# Patient Record
Sex: Female | Born: 1970 | Race: White | Hispanic: No | Marital: Married | Smoking: Never smoker
Health system: Southern US, Community
[De-identification: ages and names within clinical notes are randomized; demographics above are authoritative.]

## PROBLEM LIST (undated history)

## (undated) DIAGNOSIS — F419 Anxiety disorder, unspecified: Secondary | ICD-10-CM

## (undated) DIAGNOSIS — N979 Female infertility, unspecified: Secondary | ICD-10-CM

## (undated) DIAGNOSIS — Z9889 Other specified postprocedural states: Secondary | ICD-10-CM

## (undated) DIAGNOSIS — E669 Obesity, unspecified: Secondary | ICD-10-CM

## (undated) DIAGNOSIS — M549 Dorsalgia, unspecified: Secondary | ICD-10-CM

## (undated) DIAGNOSIS — F909 Attention-deficit hyperactivity disorder, unspecified type: Secondary | ICD-10-CM

## (undated) DIAGNOSIS — R002 Palpitations: Secondary | ICD-10-CM

## (undated) DIAGNOSIS — F329 Major depressive disorder, single episode, unspecified: Secondary | ICD-10-CM

## (undated) DIAGNOSIS — I471 Supraventricular tachycardia: Secondary | ICD-10-CM

## (undated) DIAGNOSIS — I10 Essential (primary) hypertension: Secondary | ICD-10-CM

## (undated) DIAGNOSIS — R42 Dizziness and giddiness: Secondary | ICD-10-CM

## (undated) DIAGNOSIS — E785 Hyperlipidemia, unspecified: Secondary | ICD-10-CM

## (undated) DIAGNOSIS — R112 Nausea with vomiting, unspecified: Secondary | ICD-10-CM

## (undated) DIAGNOSIS — K219 Gastro-esophageal reflux disease without esophagitis: Secondary | ICD-10-CM

## (undated) DIAGNOSIS — I4719 Other supraventricular tachycardia: Secondary | ICD-10-CM

## (undated) DIAGNOSIS — F32A Depression, unspecified: Secondary | ICD-10-CM

## (undated) DIAGNOSIS — R7303 Prediabetes: Secondary | ICD-10-CM

## (undated) HISTORY — DX: Obesity, unspecified: E66.9

## (undated) HISTORY — DX: Other specified postprocedural states: Z98.890

## (undated) HISTORY — DX: Supraventricular tachycardia: I47.1

## (undated) HISTORY — DX: Anxiety disorder, unspecified: F41.9

## (undated) HISTORY — DX: Depression, unspecified: F32.A

## (undated) HISTORY — PX: OOPHORECTOMY: SHX86

## (undated) HISTORY — DX: Dizziness and giddiness: R42

## (undated) HISTORY — PX: BREAST ENHANCEMENT SURGERY: SHX7

## (undated) HISTORY — DX: Other supraventricular tachycardia: I47.19

## (undated) HISTORY — PX: TUBAL LIGATION: SHX77

## (undated) HISTORY — PX: VAGINAL HYSTERECTOMY: SUR661

## (undated) HISTORY — PX: AUGMENTATION MAMMAPLASTY: SUR837

## (undated) HISTORY — DX: Prediabetes: R73.03

## (undated) HISTORY — DX: Nausea with vomiting, unspecified: R11.2

## (undated) HISTORY — DX: Essential (primary) hypertension: I10

## (undated) HISTORY — DX: Palpitations: R00.2

## (undated) HISTORY — DX: Gastro-esophageal reflux disease without esophagitis: K21.9

## (undated) HISTORY — DX: Hyperlipidemia, unspecified: E78.5

## (undated) HISTORY — PX: LAPAROSCOPY: SHX197

## (undated) HISTORY — DX: Female infertility, unspecified: N97.9

## (undated) HISTORY — PX: TOTAL VAGINAL HYSTERECTOMY: SHX2548

## (undated) HISTORY — DX: Dorsalgia, unspecified: M54.9

## (undated) HISTORY — PX: OTHER SURGICAL HISTORY: SHX169

## (undated) HISTORY — DX: Attention-deficit hyperactivity disorder, unspecified type: F90.9

---
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
## 2007-04-19 ENCOUNTER — Ambulatory Visit: Payer: Self-pay | Admitting: Family Medicine

## 2007-04-19 DIAGNOSIS — F418 Other specified anxiety disorders: Secondary | ICD-10-CM

## 2007-06-20 ENCOUNTER — Ambulatory Visit: Payer: Self-pay | Admitting: Family Medicine

## 2007-06-20 DIAGNOSIS — G4700 Insomnia, unspecified: Secondary | ICD-10-CM

## 2007-09-05 ENCOUNTER — Encounter: Payer: Self-pay | Admitting: Family Medicine

## 2008-03-21 ENCOUNTER — Ambulatory Visit: Payer: Self-pay | Admitting: Family Medicine

## 2008-03-21 DIAGNOSIS — E663 Overweight: Secondary | ICD-10-CM

## 2008-03-25 ENCOUNTER — Encounter: Payer: Self-pay | Admitting: Family Medicine

## 2008-03-26 LAB — CONVERTED CEMR LAB
Albumin: 4.2 g/dL (ref 3.5–5.2)
BUN: 15 mg/dL (ref 6–23)
Calcium: 8.9 mg/dL (ref 8.4–10.5)
Chloride: 106 meq/L (ref 96–112)
Creatinine, Ser: 0.58 mg/dL (ref 0.40–1.20)
Glucose, Bld: 97 mg/dL (ref 70–99)
HDL: 66 mg/dL (ref >39)
Potassium: 4.7 meq/L (ref 3.5–5.3)
Total CHOL/HDL Ratio: 3
Triglycerides: 97 mg/dL (ref <150)

## 2008-07-26 ENCOUNTER — Telehealth: Payer: Self-pay | Admitting: Family Medicine

## 2012-03-17 ENCOUNTER — Encounter: Payer: Self-pay | Admitting: Sports Medicine

## 2012-03-17 ENCOUNTER — Ambulatory Visit (INDEPENDENT_AMBULATORY_CARE_PROVIDER_SITE_OTHER): Payer: Self-pay | Admitting: Sports Medicine

## 2012-03-17 ENCOUNTER — Ambulatory Visit (INDEPENDENT_AMBULATORY_CARE_PROVIDER_SITE_OTHER): Payer: BC Managed Care – PPO

## 2012-03-17 VITALS — BP 126/71 | HR 62 | Wt 177.0 lb

## 2012-03-17 DIAGNOSIS — IMO0002 Reserved for concepts with insufficient information to code with codable children: Secondary | ICD-10-CM

## 2012-03-17 DIAGNOSIS — M47816 Spondylosis without myelopathy or radiculopathy, lumbar region: Secondary | ICD-10-CM | POA: Insufficient documentation

## 2012-03-17 DIAGNOSIS — M5416 Radiculopathy, lumbar region: Secondary | ICD-10-CM

## 2012-03-17 MED ORDER — PREDNISONE 50 MG PO TABS: ORAL_TABLET | ORAL | Status: DC

## 2012-03-17 MED ORDER — CYCLOBENZAPRINE HCL 10 MG PO TABS: ORAL_TABLET | ORAL | Status: DC

## 2012-03-17 MED ORDER — MELOXICAM 15 MG PO TABS
ORAL_TABLET | ORAL | Status: DC
Start: 2012-03-17 — End: 2012-04-11

## 2012-03-17 NOTE — Progress Notes (Signed)
  Subjective:    CC: Left leg pain  HPI: This is a very pleasant 42 year old female who is not been here since 2010. She comes in with a several month history of pain has been present in multiple locations on her left leg but starting in the lumbar spine, worse with flexion and worse with sitting in the car for long periods of time. The pain radiates down the posterior lateral aspect of her buttock, left thigh, over the anterior and medial aspect of her left knee, and down to her foot causing numbness and tingling in the last 2 toes. She has tried using a foam roller on the pain in her thigh, buttock, and knee, but notes the pain feels like a charley horse in that she cannot reproduce it with palpation. She does have a half-marathon coming up and would like to be ready for this. She denies any bowel or bladder dysfunction, or constitutional symptoms.  Past medical history, Surgical history, Family history not pertinant except as noted below, Social history, Allergies, and medications have been entered into the medical record, reviewed, and no changes needed.   Review of Systems: No headache, visual changes, nausea, vomiting, diarrhea, constipation, dizziness, abdominal pain, skin rash, fevers, chills, night sweats, weight loss, swollen lymph nodes, body aches, joint swelling, muscle aches, chest pain, shortness of breath, mood changes, visual or auditory hallucinations.   Objective:   General: Well Developed, well nourished, and in no acute distress.  Neuro/Psych: Alert and oriented x3, extra-ocular muscles intact, able to move all 4 extremities, sensation grossly intact. Skin: Warm and dry, no rashes noted.  Respiratory: Not using accessory muscles, speaking in full sentences, trachea midline.  Cardiovascular: Pulses palpable, no extremity edema. Abdomen: Does not appear distended. Back Exam:  Inspection: Unremarkable  Motion: Flexion 45 deg, Extension 45 deg, Side Bending to 45 deg bilaterally,   Rotation to 45 deg bilaterally  SLR laying: Positive with reproduction of radicular symptoms down the leg.  XSLR laying: Negative  Palpable tenderness: None. FABER: negative. Sensory change: Gross sensation intact to all lumbar and sacral dermatomes.  Reflexes: 2+ at both patellar tendons, 2+ at achilles tendons, Babinski's downgoing.  Strength at foot  Plantar-flexion: 5/5 Dorsi-flexion: 5/5 Eversion: 5/5 Inversion: 5/5  Leg strength  Quad: 5/5 Hamstring: 5/5 Hip flexor: 5/5 Hip abductors: 5/5  Gait unremarkable. Left Knee: Normal to inspection with no erythema or effusion or obvious bony abnormalities. Palpation normal with no warmth, joint line tenderness, patellar tenderness, or condyle tenderness. ROM full in flexion and extension and lower leg rotation. Ligaments with solid consistent endpoints including ACL, PCL, LCL, MCL. Negative Mcmurray's, Apley's, and Thessalonian tests. Non painful patellar compression. Patellar glide without crepitus. Patellar and quadriceps tendons unremarkable. Hamstring and quadriceps strength is normal.  X-rays were reviewed by me and show well-maintained disc spaces as well as normal facet joints however there is anterior osteophytic spurring particularly over the bodies of the L4 and L5 vertebrae.  Impression and Recommendations:   This case required medical decision making of moderate complexity.

## 2012-03-17 NOTE — Assessment & Plan Note (Signed)
Symptoms seem to represent a left-sided L5 versus S1 radiculitis. Prednisone, Mobic, Flexeril at bedtime, x-rays, formal physical therapy. Return to see me in 4 weeks, MRI for interventional injection planning if no better.

## 2012-03-20 ENCOUNTER — Ambulatory Visit (INDEPENDENT_AMBULATORY_CARE_PROVIDER_SITE_OTHER): Payer: BC Managed Care – PPO | Admitting: Physical Therapy

## 2012-03-20 DIAGNOSIS — M2560 Stiffness of unspecified joint, not elsewhere classified: Secondary | ICD-10-CM

## 2012-03-20 DIAGNOSIS — IMO0002 Reserved for concepts with insufficient information to code with codable children: Secondary | ICD-10-CM

## 2012-03-20 DIAGNOSIS — M629 Disorder of muscle, unspecified: Secondary | ICD-10-CM

## 2012-03-22 ENCOUNTER — Encounter (INDEPENDENT_AMBULATORY_CARE_PROVIDER_SITE_OTHER): Payer: BC Managed Care – PPO | Admitting: Physical Therapy

## 2012-03-22 DIAGNOSIS — M2560 Stiffness of unspecified joint, not elsewhere classified: Secondary | ICD-10-CM

## 2012-03-22 DIAGNOSIS — M545 Low back pain: Secondary | ICD-10-CM

## 2012-03-22 DIAGNOSIS — M6210 Other rupture of muscle (nontraumatic), unspecified site: Secondary | ICD-10-CM

## 2012-03-24 ENCOUNTER — Encounter (INDEPENDENT_AMBULATORY_CARE_PROVIDER_SITE_OTHER): Payer: BC Managed Care – PPO | Admitting: Physical Therapy

## 2012-03-24 DIAGNOSIS — M545 Low back pain: Secondary | ICD-10-CM

## 2012-03-24 DIAGNOSIS — M6281 Muscle weakness (generalized): Secondary | ICD-10-CM

## 2012-03-24 DIAGNOSIS — M2560 Stiffness of unspecified joint, not elsewhere classified: Secondary | ICD-10-CM

## 2012-03-27 ENCOUNTER — Encounter (INDEPENDENT_AMBULATORY_CARE_PROVIDER_SITE_OTHER): Payer: BC Managed Care – PPO | Admitting: Physical Therapy

## 2012-03-27 DIAGNOSIS — M2560 Stiffness of unspecified joint, not elsewhere classified: Secondary | ICD-10-CM

## 2012-03-27 DIAGNOSIS — M6281 Muscle weakness (generalized): Secondary | ICD-10-CM

## 2012-03-27 DIAGNOSIS — M545 Low back pain: Secondary | ICD-10-CM

## 2012-03-29 ENCOUNTER — Encounter (INDEPENDENT_AMBULATORY_CARE_PROVIDER_SITE_OTHER): Payer: BC Managed Care – PPO | Admitting: Physical Therapy

## 2012-03-29 ENCOUNTER — Telehealth: Payer: Self-pay | Admitting: *Deleted

## 2012-03-29 DIAGNOSIS — M545 Low back pain: Secondary | ICD-10-CM

## 2012-03-29 DIAGNOSIS — M5416 Radiculopathy, lumbar region: Secondary | ICD-10-CM

## 2012-03-29 DIAGNOSIS — M25649 Stiffness of unspecified hand, not elsewhere classified: Secondary | ICD-10-CM

## 2012-03-29 DIAGNOSIS — M6020 Foreign body granuloma of soft tissue, not elsewhere classified, unspecified site: Secondary | ICD-10-CM

## 2012-03-29 NOTE — Telephone Encounter (Signed)
LMOM to return call. Kimberly Gordon, LPN  

## 2012-03-29 NOTE — Telephone Encounter (Signed)
Patient calls and states she has done 5 sessions of physical therapy and her pain is not getting any better per pt pqain is worse. Wants to know if you want to do MRI or what other suggestions you have. Please call

## 2012-03-29 NOTE — Telephone Encounter (Signed)
Yes, now its time for MRI lumbar spine, he should come to see me to go over MRI results and come up with a plan. Will also route to [facility].

## 2012-03-30 ENCOUNTER — Telehealth: Payer: Self-pay | Admitting: *Deleted

## 2012-03-30 NOTE — Telephone Encounter (Signed)
Pt notified MRI order place. Barry Dienes, LPN

## 2012-03-30 NOTE — Telephone Encounter (Signed)
Called [employer] [place] and no pre cert is required for MRI Lumbar Spine without contrast. Bonita Quin notified with [facility] in [place]. Barry Dienes, LPN

## 2012-03-31 ENCOUNTER — Encounter: Payer: BC Managed Care – PPO | Admitting: Physical Therapy

## 2012-04-01 ENCOUNTER — Ambulatory Visit (HOSPITAL_BASED_OUTPATIENT_CLINIC_OR_DEPARTMENT_OTHER): Payer: BC Managed Care – PPO

## 2012-04-01 ENCOUNTER — Ambulatory Visit (HOSPITAL_BASED_OUTPATIENT_CLINIC_OR_DEPARTMENT_OTHER)
Admission: RE | Admit: 2012-04-01 | Discharge: 2012-04-01 | Disposition: A | Discharge status: Home / Self Care | Payer: BC Managed Care – PPO | Source: Ambulatory Visit | Attending: Sports Medicine | Admitting: Sports Medicine

## 2012-04-01 DIAGNOSIS — M79609 Pain in unspecified limb: Secondary | ICD-10-CM | POA: Insufficient documentation

## 2012-04-01 DIAGNOSIS — M5124 Other intervertebral disc displacement, thoracic region: Secondary | ICD-10-CM | POA: Insufficient documentation

## 2012-04-01 DIAGNOSIS — M545 Low back pain, unspecified: Secondary | ICD-10-CM | POA: Insufficient documentation

## 2012-04-01 DIAGNOSIS — M5126 Other intervertebral disc displacement, lumbar region: Secondary | ICD-10-CM | POA: Insufficient documentation

## 2012-04-03 ENCOUNTER — Encounter (INDEPENDENT_AMBULATORY_CARE_PROVIDER_SITE_OTHER): Payer: BC Managed Care – PPO | Admitting: Physical Therapy

## 2012-04-03 DIAGNOSIS — M545 Low back pain: Secondary | ICD-10-CM

## 2012-04-03 DIAGNOSIS — M6281 Muscle weakness (generalized): Secondary | ICD-10-CM

## 2012-04-03 DIAGNOSIS — M2560 Stiffness of unspecified joint, not elsewhere classified: Secondary | ICD-10-CM

## 2012-04-04 ENCOUNTER — Ambulatory Visit (INDEPENDENT_AMBULATORY_CARE_PROVIDER_SITE_OTHER): Payer: BC Managed Care – PPO | Admitting: Sports Medicine

## 2012-04-04 ENCOUNTER — Encounter: Payer: Self-pay | Admitting: Sports Medicine

## 2012-04-04 ENCOUNTER — Ambulatory Visit
Admission: RE | Admit: 2012-04-04 | Discharge: 2012-04-04 | Disposition: A | Discharge status: Home / Self Care | Payer: BC Managed Care – PPO | Source: Ambulatory Visit | Attending: Sports Medicine | Admitting: Sports Medicine

## 2012-04-04 VITALS — BP 123/69 | HR 82 | Wt 179.0 lb

## 2012-04-04 DIAGNOSIS — IMO0002 Reserved for concepts with insufficient information to code with codable children: Secondary | ICD-10-CM

## 2012-04-04 DIAGNOSIS — M5416 Radiculopathy, lumbar region: Secondary | ICD-10-CM

## 2012-04-04 MED ORDER — GABAPENTIN 300 MG PO CAPS: ORAL_CAPSULE | ORAL | Status: DC

## 2012-04-04 MED ORDER — METHYLPREDNISOLONE ACETATE 40 MG/ML INJ SUSP (RADIOLOG
120.0000 mg | Freq: Once | INTRAMUSCULAR | Status: AC
  Administered 2012-04-04: 120 mg via EPIDURAL

## 2012-04-04 MED ORDER — IOHEXOL 180 MG/ML  SOLN
1.0000 mL | Freq: Once | INTRAMUSCULAR | Status: AC | PRN
  Administered 2012-04-04: 1 mL via EPIDURAL

## 2012-04-04 NOTE — Progress Notes (Signed)
  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 To recap, it has been worse with flexion of the spine, as well as sitting in a car for long periods of time. She went through formal physical therapy as well as oral medications and is no better. I recently obtained an MRI for consideration of interventional injection planning.  Past medical history, Surgical history, Family history not pertinant except as noted below, Social history, Allergies, and medications have been entered into the medical record, reviewed, and no changes needed.   Review of Systems: No headache, visual changes, nausea, vomiting, diarrhea, constipation, dizziness, abdominal pain, skin rash, fevers, chills, night sweats, weight loss, swollen lymph nodes, body aches, joint swelling, muscle aches, chest pain, shortness of breath, mood changes, visual or auditory hallucinations.   Objective:   General: Well Developed, well nourished, and in no acute distress.  Neuro/Psych: Alert and oriented x3, extra-ocular muscles intact, able to move all 4 extremities, sensation grossly intact. Skin: Warm and dry, no rashes noted.  Respiratory: Not using accessory muscles, speaking in full sentences, trachea midline.  Cardiovascular: Pulses palpable, no extremity edema. Abdomen: Does not appear distended.  MRI results were reviewed she does have multilevel degenerative disc disease with a right-sided protrusion at the L3-L4 level, and based on my read a broad-based disc protrusion with disc desiccation at the L4-L5 level likely causing mild bilateral foraminal stenosis. I did discuss this with the radiologist and he feels that the changes at the L4-L5 level are minimal.  Impression and  Recommendations:   This case required medical decision making of moderate complexity.

## 2012-04-04 NOTE — Assessment & Plan Note (Signed)
On further review the MRI I do see bilateral mild foraminal stenosis at the L4-L5 level. I do think the left L4 nerve root is affected, I would like to try transforaminal epidural steroid injection on the left at the L4-L5 level. I will see her back to go over results of the injection. I'm also going to start her gabapentin up taper.

## 2012-04-05 ENCOUNTER — Encounter (INDEPENDENT_AMBULATORY_CARE_PROVIDER_SITE_OTHER): Payer: BC Managed Care – PPO | Admitting: Physical Therapy

## 2012-04-05 DIAGNOSIS — M545 Low back pain: Secondary | ICD-10-CM

## 2012-04-05 DIAGNOSIS — M6281 Muscle weakness (generalized): Secondary | ICD-10-CM

## 2012-04-05 DIAGNOSIS — M2560 Stiffness of unspecified joint, not elsewhere classified: Secondary | ICD-10-CM

## 2012-04-07 ENCOUNTER — Other Ambulatory Visit: Payer: BC Managed Care – PPO

## 2012-04-07 ENCOUNTER — Encounter (INDEPENDENT_AMBULATORY_CARE_PROVIDER_SITE_OTHER): Payer: BC Managed Care – PPO | Admitting: Physical Therapy

## 2012-04-07 DIAGNOSIS — M2560 Stiffness of unspecified joint, not elsewhere classified: Secondary | ICD-10-CM

## 2012-04-07 DIAGNOSIS — M6281 Muscle weakness (generalized): Secondary | ICD-10-CM

## 2012-04-07 DIAGNOSIS — M545 Low back pain: Secondary | ICD-10-CM

## 2012-04-10 ENCOUNTER — Encounter: Payer: Self-pay | Admitting: Sports Medicine

## 2012-04-10 ENCOUNTER — Encounter (INDEPENDENT_AMBULATORY_CARE_PROVIDER_SITE_OTHER): Payer: BC Managed Care – PPO | Admitting: Physical Therapy

## 2012-04-10 ENCOUNTER — Ambulatory Visit (INDEPENDENT_AMBULATORY_CARE_PROVIDER_SITE_OTHER): Payer: BC Managed Care – PPO | Admitting: Sports Medicine

## 2012-04-10 VITALS — BP 121/82 | HR 66 | Wt 178.0 lb

## 2012-04-10 DIAGNOSIS — M2560 Stiffness of unspecified joint, not elsewhere classified: Secondary | ICD-10-CM

## 2012-04-10 DIAGNOSIS — IMO0002 Reserved for concepts with insufficient information to code with codable children: Secondary | ICD-10-CM

## 2012-04-10 DIAGNOSIS — M6281 Muscle weakness (generalized): Secondary | ICD-10-CM

## 2012-04-10 DIAGNOSIS — M5416 Radiculopathy, lumbar region: Secondary | ICD-10-CM

## 2012-04-10 DIAGNOSIS — M545 Low back pain, unspecified: Secondary | ICD-10-CM

## 2012-04-10 MED ORDER — CYCLOBENZAPRINE HCL 10 MG PO TABS: 10.0000 mg | ORAL_TABLET | Freq: Three times a day (TID) | ORAL | Status: DC | PRN

## 2012-04-10 NOTE — Assessment & Plan Note (Signed)
Good response to initial left-sided L4-L5 interlaminar epidural steroid injection. I am sending her for repeat.

## 2012-04-10 NOTE — Progress Notes (Signed)
  Subjective:    CC: F/u epidural injection.  HPI:  This pleasant 42 year old female comes back after a left-sided L4-L5 interlaminar epidural steroid injection. She noted good benefit after the injection, she did run a half marathon afterwards and did very well. Some of her pain came back but overall she feels that her symptoms have improved significantly. She did with gabapentin and needs a refill on Mobic and cyclobenzaprine.  Past medical history, Surgical history, Family history not pertinant except as noted below, Social history, Allergies, and medications have been entered into the medical record, reviewed, and no changes needed.   Review of Systems: No headache, visual changes, nausea, vomiting, diarrhea, constipation, dizziness, abdominal pain, skin rash, fevers, chills, night sweats, weight loss, swollen lymph nodes, body aches, joint swelling, muscle aches, chest pain, shortness of breath, mood changes, visual or auditory hallucinations.   Objective:   General: Well Developed, well nourished, and in no acute distress.  Neuro/Psych: Alert and oriented x3, extra-ocular muscles intact, able to move all 4 extremities, sensation grossly intact. Skin: Warm and dry, no rashes noted.  Respiratory: Not using accessory muscles, speaking in full sentences, trachea midline.  Cardiovascular: Pulses palpable, no extremity edema. Abdomen: Does not appear distended. Impression and Recommendations:   This case required medical decision making of moderate complexity.

## 2012-04-11 ENCOUNTER — Other Ambulatory Visit: Payer: Self-pay | Admitting: *Deleted

## 2012-04-11 MED ORDER — MELOXICAM 15 MG PO TABS: ORAL_TABLET | ORAL | Status: DC

## 2012-04-12 ENCOUNTER — Encounter (INDEPENDENT_AMBULATORY_CARE_PROVIDER_SITE_OTHER): Payer: BC Managed Care – PPO | Admitting: Physical Therapy

## 2012-04-12 ENCOUNTER — Telehealth: Payer: Self-pay | Admitting: *Deleted

## 2012-04-12 DIAGNOSIS — M5416 Radiculopathy, lumbar region: Secondary | ICD-10-CM

## 2012-04-12 DIAGNOSIS — M545 Low back pain, unspecified: Secondary | ICD-10-CM

## 2012-04-12 DIAGNOSIS — M6281 Muscle weakness (generalized): Secondary | ICD-10-CM

## 2012-04-12 DIAGNOSIS — M2560 Stiffness of unspecified joint, not elsewhere classified: Secondary | ICD-10-CM

## 2012-04-12 NOTE — Telephone Encounter (Signed)
So sorry, orders placed!

## 2012-04-12 NOTE — Telephone Encounter (Signed)
Pt needs referral placed to [facility] for her injections. She would like to have it done on Monday b/c she is going out of town later next week.

## 2012-04-13 NOTE — Telephone Encounter (Signed)
LM for Danielle at [facility] to schedule.

## 2012-04-14 ENCOUNTER — Encounter (INDEPENDENT_AMBULATORY_CARE_PROVIDER_SITE_OTHER): Payer: BC Managed Care – PPO | Admitting: Physical Therapy

## 2012-04-14 DIAGNOSIS — M545 Low back pain: Secondary | ICD-10-CM

## 2012-04-14 DIAGNOSIS — M6281 Muscle weakness (generalized): Secondary | ICD-10-CM

## 2012-04-14 DIAGNOSIS — M2560 Stiffness of unspecified joint, not elsewhere classified: Secondary | ICD-10-CM

## 2012-04-17 ENCOUNTER — Ambulatory Visit
Admission: RE | Admit: 2012-04-17 | Discharge: 2012-04-17 | Disposition: A | Discharge status: Home / Self Care | Payer: BC Managed Care – PPO | Source: Ambulatory Visit | Attending: Sports Medicine | Admitting: Sports Medicine

## 2012-04-17 ENCOUNTER — Other Ambulatory Visit: Payer: BC Managed Care – PPO

## 2012-04-17 MED ORDER — METHYLPREDNISOLONE ACETATE 40 MG/ML INJ SUSP (RADIOLOG
120.0000 mg | Freq: Once | INTRAMUSCULAR | Status: AC
  Administered 2012-04-17: 120 mg via EPIDURAL

## 2012-04-17 MED ORDER — IOHEXOL 180 MG/ML  SOLN
1.0000 mL | Freq: Once | INTRAMUSCULAR | Status: AC | PRN
  Administered 2012-04-17: 1 mL via EPIDURAL

## 2012-04-21 ENCOUNTER — Encounter (INDEPENDENT_AMBULATORY_CARE_PROVIDER_SITE_OTHER): Payer: BC Managed Care – PPO | Admitting: Physical Therapy

## 2012-04-21 DIAGNOSIS — M6281 Muscle weakness (generalized): Secondary | ICD-10-CM

## 2012-04-21 DIAGNOSIS — M2560 Stiffness of unspecified joint, not elsewhere classified: Secondary | ICD-10-CM

## 2012-04-21 DIAGNOSIS — M545 Low back pain: Secondary | ICD-10-CM

## 2012-05-05 ENCOUNTER — Ambulatory Visit (INDEPENDENT_AMBULATORY_CARE_PROVIDER_SITE_OTHER): Payer: BC Managed Care – PPO | Admitting: Sports Medicine

## 2012-05-05 ENCOUNTER — Encounter: Payer: Self-pay | Admitting: Sports Medicine

## 2012-05-05 VITALS — BP 129/85 | HR 100 | Wt 174.0 lb

## 2012-05-05 DIAGNOSIS — M5416 Radiculopathy, lumbar region: Secondary | ICD-10-CM

## 2012-05-05 DIAGNOSIS — B029 Zoster without complications: Secondary | ICD-10-CM

## 2012-05-05 DIAGNOSIS — IMO0002 Reserved for concepts with insufficient information to code with codable children: Secondary | ICD-10-CM

## 2012-05-05 MED ORDER — CYCLOBENZAPRINE HCL 10 MG PO TABS: 10.0000 mg | ORAL_TABLET | Freq: Three times a day (TID) | ORAL | Status: DC | PRN

## 2012-05-05 MED ORDER — VALACYCLOVIR HCL 1 G PO TABS: 1000.0000 mg | ORAL_TABLET | Freq: Two times a day (BID) | ORAL | Status: DC

## 2012-05-05 MED ORDER — MELOXICAM 15 MG PO TABS: ORAL_TABLET | ORAL | Status: DC

## 2012-05-05 NOTE — Assessment & Plan Note (Signed)
Status post an L4-L5 left-sided interlaminar epidural. Now most recently status post left-sided L4-L5 transforaminal selective epidural steroid injection. Pain is decreased from a 7/10 to a 2/10. She's feeling good, able to run, and feels as though she does not need injection #3. She can come back to see me on an as-needed basis.

## 2012-05-05 NOTE — Patient Instructions (Addendum)

## 2012-05-05 NOTE — Assessment & Plan Note (Signed)
In the left upper cervical distribution, no evidence of trigeminal nerve involvement. Valtrex.

## 2012-05-05 NOTE — Progress Notes (Signed)
   Subjective:    CC: Followup  HPI: Left lumbar radiculitis: This very pleasant runner has now had 2 epidural injections, the first was interlaminar on the left side at the L4-L5 level, the second was transforaminal on the left side at the L4-L5 level. She reports that her pain is decreased from 7 to a 2/10, she's able to run but essentially no pain. She is very happy with the results, and does not feel she needs an additional injection just yet.  Rash: Present for almost a week on the left side of her neck, more painful and itchy, started out with a tingling sensation. No changes in detergent, jewelry.  Past medical history, Surgical history, Family history not pertinant except as noted below, Social history, Allergies, and medications have been entered into the medical record, reviewed, and no changes needed.   Review of Systems: No headache, visual changes, nausea, vomiting, diarrhea, constipation, dizziness, abdominal pain, skin rash, fevers, chills, night sweats, weight loss, swollen lymph nodes, body aches, joint swelling, muscle aches, chest pain, shortness of breath, mood changes, visual or auditory hallucinations.   Objective:   General: Well Developed, well nourished, and in no acute distress.  Neuro/Psych: Alert and oriented x3, extra-ocular muscles intact, able to move all 4 extremities, sensation grossly intact. Skin: Warm and dry, there is a grouped vesicular rash with crusts in various stages over the left side of her neck, left trapezius, as well as left upper chest. This rash does not cross the midline, there is no sign of bacterial superinfection. There is no involvement of her face. Respiratory: Not using accessory muscles, speaking in full sentences, trachea midline.  Cardiovascular: Pulses palpable, no extremity edema. Abdomen: Does not appear distended. Impression and Recommendations:   This case required medical decision making of moderate complexity.

## 2012-05-08 ENCOUNTER — Telehealth: Payer: Self-pay | Admitting: *Deleted

## 2012-05-08 NOTE — Telephone Encounter (Signed)
Once all lesions are crusted over, and she has been on the antiviral medicine for at least 24 hours she should be able to go back to work, she is not infectious to anyone that has had chickenpox in the past, or had the Varicella zoster or shingles vaccine.

## 2012-05-08 NOTE — Telephone Encounter (Signed)
Pt calls and states you diagnosed her Friday with shingles.  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 Also has jury duty on the 28th

## 2012-05-08 NOTE — Telephone Encounter (Signed)
Patient notified. Barry Dienes, LPN

## 2012-09-01 ENCOUNTER — Other Ambulatory Visit: Payer: Self-pay | Admitting: Sports Medicine

## 2012-11-08 ENCOUNTER — Ambulatory Visit (INDEPENDENT_AMBULATORY_CARE_PROVIDER_SITE_OTHER): Payer: BC Managed Care – PPO | Admitting: Physician Assistant

## 2012-11-08 DIAGNOSIS — Z23 Encounter for immunization: Secondary | ICD-10-CM

## 2012-11-08 NOTE — Progress Notes (Signed)
  Subjective:    Patient ID: Andrea Marquez, female    DOB: 1970-05-28, 42 y.o.   MRN: [id number]  HPI    Review of Systems     Objective:   Physical Exam        Assessment & Plan:  Flu shot given without complications. Tandy Gaw PA-C

## 2013-03-05 ENCOUNTER — Other Ambulatory Visit: Payer: Self-pay | Admitting: Sports Medicine

## 2013-09-10 ENCOUNTER — Other Ambulatory Visit: Payer: Self-pay | Admitting: Sports Medicine

## 2013-11-02 ENCOUNTER — Ambulatory Visit (INDEPENDENT_AMBULATORY_CARE_PROVIDER_SITE_OTHER): Payer: BC Managed Care – PPO | Admitting: Family Medicine

## 2013-11-02 VITALS — BP 114/78 | HR 60 | Ht 68.0 in | Wt 193.0 lb

## 2013-11-02 DIAGNOSIS — Z111 Encounter for screening for respiratory tuberculosis: Secondary | ICD-10-CM | POA: Diagnosis not present

## 2013-11-02 NOTE — Progress Notes (Signed)
   Subjective:    Patient ID: Andrea Marquez, female    DOB: December 21, 1970, 43 y.o.   MRN: [id number]  HPI Jamala reported today for PPD which was placed on her left lower arm. She was informed that she needed to RTC on Monday morning to have read. Margette Fast, CMA    Review of Systems     Objective:   Physical Exam        Assessment & Plan:

## 2013-11-05 LAB — TB SKIN TEST
Induration: 0 mm
TB Skin Test: NEGATIVE

## 2013-11-26 ENCOUNTER — Encounter: Payer: Self-pay | Admitting: Family Medicine

## 2013-11-26 ENCOUNTER — Ambulatory Visit (INDEPENDENT_AMBULATORY_CARE_PROVIDER_SITE_OTHER): Payer: BC Managed Care – PPO | Admitting: Family Medicine

## 2013-11-26 VITALS — BP 128/86 | HR 81 | Wt 187.0 lb

## 2013-11-26 DIAGNOSIS — B029 Zoster without complications: Secondary | ICD-10-CM | POA: Diagnosis not present

## 2013-11-26 DIAGNOSIS — N951 Menopausal and female climacteric states: Secondary | ICD-10-CM | POA: Diagnosis not present

## 2013-11-26 DIAGNOSIS — Z0000 Encounter for general adult medical examination without abnormal findings: Secondary | ICD-10-CM | POA: Diagnosis not present

## 2013-11-26 DIAGNOSIS — R635 Abnormal weight gain: Secondary | ICD-10-CM

## 2013-11-26 DIAGNOSIS — R232 Flushing: Secondary | ICD-10-CM

## 2013-11-26 LAB — CBC
HEMATOCRIT: 43 % (ref 36.0–46.0)
Hemoglobin: 14.9 g/dL (ref 12.0–15.0)
MCH: 32.1 pg (ref 26.0–34.0)
MCHC: 34.7 g/dL (ref 30.0–36.0)
MCV: 92.7 fL (ref 78.0–100.0)
Platelets: 284 10*3/uL (ref 150–400)
RBC: 4.64 MIL/uL (ref 3.87–5.11)
RDW: 13.1 % (ref 11.5–15.5)
WBC: 5.5 10*3/uL (ref 4.0–10.5)

## 2013-11-26 MED ORDER — VALACYCLOVIR HCL 1 G PO TABS: 1000.0000 mg | ORAL_TABLET | Freq: Two times a day (BID) | ORAL | Status: DC

## 2013-11-26 NOTE — Progress Notes (Signed)
Subjective:     Andrea Marquez is a 43 y.o. female and is here for a comprehensive physical exam. The patient reports no problems.she is very fresh rated with her recent weight gain. She said one point she gained 20 pounds in almost 2 months. She wonders if it could be her increased stress levels. She runs several miles per week and in fact participates in marathons. She feels like she knows the right foods to eat. She feels like for the most part she really eats what she should eat. He still complains of hot flashes during the daytime and at nighttime and still wonders if her thyroid could be off. It has been several years since we have done blood work on her. Unfortunately her husband has been communicating with other women. They are currently involved in marital counseling but it is not going well. He still continues to reach out to other women and does travel some for his job.    History   Social History  . Marital Status: Married    Spouse Name: N/A    Number of Children: N/A  . Years of Education: N/A   Occupational History  . Not on file.   Social History Main Topics  . Smoking status: Never Smoker   . Smokeless tobacco: Not on file  . Alcohol Use: Not on file  . Drug Use: No  . Sexual Activity: No   Other Topics Concern  . Not on file   Social History Narrative   Daily caffeine. Works on regularly.    Health Maintenance  Topic Date Due  . TETANUS/TDAP  06/06/1987  . INFLUENZA VACCINE  08/26/2014    The following portions of the patient's history were reviewed and updated as appropriate: allergies, current medications, past family history, past medical history, past social history, past surgical history and problem list.  Review of Systems A comprehensive review of systems was negative.   Objective:    BP 128/86 mmHg  Pulse 81  Wt 187 lb (84.823 kg) General appearance: alert, cooperative and appears stated age Head: Normocephalic, without obvious abnormality,  atraumatic Eyes: conj clear, EOMi, PEERLA Ears: normal TM's and external ear canals both ears Nose: Nares normal. Septum midline. Mucosa normal. No drainage or sinus tenderness. Throat: lips, mucosa, and tongue normal; teeth and gums normal Neck: no adenopathy, no carotid bruit, no JVD, supple, symmetrical, trachea midline and thyroid not enlarged, symmetric, no tenderness/mass/nodules Back: symmetric, no curvature. ROM normal. No CVA tenderness. Lungs: clear to auscultation bilaterally Breasts: normal appearance, no masses or tenderness Heart: regular rate and rhythm, S1, S2 normal, no murmur, click, rub or gallop Abdomen: soft, non-tender; bowel sounds normal; no masses,  no organomegaly Pelvic: deferred Extremities: extremities normal, atraumatic, no cyanosis or edema Pulses: 2+ and symmetric Skin: Skin color, texture, turgor normal. No rashes or lesions Lymph nodes: Cervical, supraclavicular, and axillary nodes normal. Neurologic: Alert and oriented X 3, normal strength and tone. Normal symmetric reflexes. Normal coordination and gait    Assessment:    Healthy female exam.      Plan:     See After Visit Summary for Counseling Recommendations   Keep up a regular exercise program and make sure you are eating a healthy diet Try to eat 4 servings of dairy a day, or if you are lactose intolerant take a calcium with vitamin D daily.  Your vaccines are up to date.   difficuly losing weight   -  Discussed possible nutrition referral.  Right now she is doing hCG drops and minimizing her calorie intake. She is exercising regularly.I really think a nutritionist consult would be very helpful for her. It sounds to me like overall she's doing a good job with diet and with regular exercise but something is missing. I think working with a specialist in this area could help treat her diet to make sure that she's getting enough calories and nutrition and protein in. We could also consider weight loss  medication in the future if she would like.  Acute situational stress-currently involved in marital counseling. Patient was very tearful in the office today. Encouraged her to call us if she feels like she needs any help or assistance.

## 2013-11-26 NOTE — Addendum Note (Signed)
Addended by: Teddy Spike on: 11/26/2013 01:37 PM   Modules accepted: Miquel Dunn

## 2013-11-26 NOTE — Addendum Note (Signed)
Addended by: Teddy Spike on: 11/26/2013 01:36 PM   Modules accepted: Miquel Dunn

## 2013-11-26 NOTE — Patient Instructions (Signed)
Keep up a regular exercise program and make sure you are eating a healthy diet Try to eat 4 servings of dairy a day, or if you are lactose intolerant take a calcium with vitamin D daily.  Your vaccines are up to date.   

## 2013-11-27 LAB — COMPREHENSIVE METABOLIC PANEL
ALBUMIN: 4.6 g/dL (ref 3.5–5.2)
ALK PHOS: 44 U/L (ref 39–117)
ALT: 25 U/L (ref 0–35)
AST: 20 U/L (ref 0–37)
BILIRUBIN TOTAL: 0.6 mg/dL (ref 0.2–1.2)
BUN: 13 mg/dL (ref 6–23)
CO2: 24 mEq/L (ref 19–32)
Calcium: 9.6 mg/dL (ref 8.4–10.5)
Chloride: 104 mEq/L (ref 96–112)
Creat: 0.75 mg/dL (ref 0.50–1.10)
Glucose, Bld: 101 mg/dL — ABNORMAL HIGH (ref 70–99)
POTASSIUM: 4.4 meq/L (ref 3.5–5.3)
SODIUM: 139 meq/L (ref 135–145)
TOTAL PROTEIN: 7.4 g/dL (ref 6.0–8.3)

## 2013-11-27 LAB — LIPID PANEL
CHOL/HDL RATIO: 3.4 ratio
Cholesterol: 185 mg/dL (ref 0–200)
HDL: 55 mg/dL (ref >39)
LDL CALC: 113 mg/dL — AB (ref 0–99)
Triglycerides: 86 mg/dL (ref <150)
VLDL: 17 mg/dL (ref 0–40)

## 2013-11-27 LAB — PROGESTERONE: Progesterone: 4.5 ng/mL

## 2013-11-27 LAB — ESTRADIOL: ESTRADIOL: 56.5 pg/mL

## 2013-11-27 LAB — TSH: TSH: 2.32 u[IU]/mL (ref 0.350–4.500)

## 2013-11-27 LAB — LUTEINIZING HORMONE: LH: 5.3 m[IU]/mL

## 2013-11-27 LAB — FOLLICLE STIMULATING HORMONE: FSH: 10 m[IU]/mL

## 2014-09-17 ENCOUNTER — Encounter: Payer: Self-pay | Admitting: Family Medicine

## 2014-09-17 ENCOUNTER — Ambulatory Visit (INDEPENDENT_AMBULATORY_CARE_PROVIDER_SITE_OTHER): Payer: Commercial Indemnity | Admitting: Family Medicine

## 2014-09-17 VITALS — BP 124/81 | HR 79 | Wt 186.0 lb

## 2014-09-17 DIAGNOSIS — Z0000 Encounter for general adult medical examination without abnormal findings: Secondary | ICD-10-CM

## 2014-09-17 DIAGNOSIS — Z114 Encounter for screening for human immunodeficiency virus [HIV]: Secondary | ICD-10-CM | POA: Diagnosis not present

## 2014-09-17 DIAGNOSIS — F430 Acute stress reaction: Secondary | ICD-10-CM

## 2014-09-17 MED ORDER — ESCITALOPRAM OXALATE 10 MG PO TABS: 10.0000 mg | ORAL_TABLET | Freq: Every day | ORAL | Status: DC

## 2014-09-17 NOTE — Progress Notes (Signed)
Subjective:     Andrea Marquez is a 44 y.o. female and is here for a comprehensive physical exam. The patient reports problems - she is still having problems with her marriage.  she has been have attempted to see 2 different marriage counselors but unfortunately has not worked out. She just is feeling very overwhelmed and tearful at times.  One of her kids is graduating form college this year and a 2nd in [place]. Says her children are supportive.  She also lost her job recently. Evidently her workplace lost a big contract and 15% of the workforce was cut and fortunately it affected her. She did receive a severance package so financially has been okay until June. She said she is sent out numbers 32 resumes to try to find a new job but has not heard back on any of them yet for an interview. She said she is crying easily and feels very emotional at times. She's not sure what to do at this point.  Social History   Social History  . Marital Status: Married    Spouse Name: Randall Hiss  . Number of Children: 2  . Years of Education: N/A   Occupational History  . Not on file.   Social History Main Topics  . Smoking status: Never Smoker   . Smokeless tobacco: Not on file  . Alcohol Use: Not on file  . Drug Use: No  . Sexual Activity: No   Other Topics Concern  . Not on file   Social History Narrative   Daily caffeine. Works on regularly.    Health Maintenance  Topic Date Due  . HIV Screening  06/06/1983  . INFLUENZA VACCINE  09/17/2015 (Originally 08/26/2014)  . TETANUS/TDAP  05/30/2023    The following portions of the patient's history were reviewed and updated as appropriate: allergies, current medications, past family history, past medical history, past social history, past surgical history and problem list.  Review of Systems A comprehensive review of systems was negative.   Objective:    BP 124/81 mmHg  Pulse 79  Wt 186 lb (84.369 kg) General appearance: alert, cooperative and appears  stated age Head: Normocephalic, without obvious abnormality, atraumatic Eyes: conj clear, EOMI, PEERLA Ears: normal TM's and external ear canals both ears Nose: Nares normal. Septum midline. Mucosa normal. No drainage or sinus tenderness. Throat: lips, mucosa, and tongue normal; teeth and gums normal Neck: no adenopathy, no carotid bruit, no JVD, supple, symmetrical, trachea midline and thyroid not enlarged, symmetric, no tenderness/mass/nodules Back: symmetric, no curvature. ROM normal. No CVA tenderness. Lungs: clear to auscultation bilaterally Breasts: normal appearance, no masses or tenderness Heart: regular rate and rhythm, S1, S2 normal, no murmur, click, rub or gallop Abdomen: soft, non-tender; bowel sounds normal; no masses,  no organomegaly Extremities: extremities normal, atraumatic, no cyanosis or edema Pulses: 2+ and symmetric Skin: Skin color, texture, turgor normal. No rashes or lesions Lymph nodes: Cervical, supraclavicular, and axillary nodes normal. Neurologic: Alert and oriented X 3, normal strength and tone. Normal symmetric reflexes. Normal coordination and gait a   Assessment:    Healthy female exam.      Plan:     See After Visit Summary for Counseling Recommendations   Keep up a regular exercise program and make sure you are eating a healthy diet Try to eat 4 servings of dairy a day, or if you are lactose intolerant take a calcium with vitamin D daily.  Your vaccines are up to date.  Discussed mammogram  guidelines.  She will hold off this year and may schedule later this year or next year.    Acute stress - discussed options.  If husband not wanted to pursue further counseling I think it would be good for her to help her make some life decisions and to help organize her thoughts.  Also discussed putting her on an SSRI to help with tearfulness and feeling anxious and down.  Will start lexapro. F/U in 3-4 weeks. Call if any problems or S.E.

## 2014-09-19 LAB — COMPLETE METABOLIC PANEL WITH GFR
ALK PHOS: 31 U/L — AB (ref 33–115)
ALT: 38 U/L — ABNORMAL HIGH (ref 6–29)
AST: 26 U/L (ref 10–35)
Albumin: 4.1 g/dL (ref 3.6–5.1)
BUN: 17 mg/dL (ref 7–25)
CO2: 25 mmol/L (ref 20–31)
Calcium: 9 mg/dL (ref 8.6–10.2)
Chloride: 106 mmol/L (ref 98–110)
Creat: 0.61 mg/dL (ref 0.50–1.10)
GFR, Est African American: >89 mL/min (ref >=60)
GFR, Est Non African American: >89 mL/min (ref >=60)
GLUCOSE: 97 mg/dL (ref 65–99)
Potassium: 4.6 mmol/L (ref 3.5–5.3)
SODIUM: 142 mmol/L (ref 135–146)
Total Bilirubin: 0.5 mg/dL (ref 0.2–1.2)
Total Protein: 6.3 g/dL (ref 6.1–8.1)

## 2014-09-19 LAB — LIPID PANEL
CHOL/HDL RATIO: 3 ratio (ref <=5.0)
Cholesterol: 168 mg/dL (ref 125–200)
HDL: 56 mg/dL (ref >=46)
LDL Cholesterol: 93 mg/dL (ref <130)
Triglycerides: 97 mg/dL (ref <150)
VLDL: 19 mg/dL (ref <30)

## 2014-09-19 LAB — TSH: TSH: 1.858 u[IU]/mL (ref 0.350–4.500)

## 2014-09-20 LAB — HIV ANTIBODY (ROUTINE TESTING W REFLEX): HIV 1&2 Ab, 4th Generation: NONREACTIVE

## 2014-10-15 ENCOUNTER — Ambulatory Visit: Payer: Commercial Indemnity | Admitting: Family Medicine

## 2014-10-21 ENCOUNTER — Ambulatory Visit (INDEPENDENT_AMBULATORY_CARE_PROVIDER_SITE_OTHER): Payer: Commercial Indemnity | Admitting: Family Medicine

## 2014-10-21 ENCOUNTER — Encounter: Payer: Self-pay | Admitting: Family Medicine

## 2014-10-21 VITALS — BP 122/85 | HR 64 | Ht 68.0 in | Wt 191.0 lb

## 2014-10-21 DIAGNOSIS — F430 Acute stress reaction: Secondary | ICD-10-CM | POA: Diagnosis not present

## 2014-10-21 MED ORDER — FLUOXETINE HCL 10 MG PO CAPS
ORAL_CAPSULE | ORAL | Status: DC
Start: 2014-10-21 — End: 2014-12-12

## 2014-10-21 NOTE — Progress Notes (Signed)
   Subjective:    Patient ID: Andrea Marquez, female    DOB: 01-10-1971, 44 y.o.   MRN: [id number]  HPI mood GAD-7= PHQ-9= doing better, crying episodes have gotten better also    Overall she's been tolerating the Lexapro well. She's noticed that she's much less tearful and now crying not even on a daily basis. She does feel like she's handled some stressors a little better. she says her husband has been responding to her in a better way. Her unemployement has run out.  Doesn't feel as motivated. She wants to exercise but doesn't feel motivated.  But when her kids came home over the weekend she felt extremely motivated to prepare food and says she's been him is the whole weekend in the kitchen trying to make their favorite things. They are at college. She just found out that her unemployment expires after 13 weeks and she cannot get an extension. She has been doing some classes in management to try to get a new job and make some more money if not better to what she was making before. She said initially her husband was not supportive of her at all and now he has been more supportive. She still not sleeping well and still feels like she constantly feels nervous internally all day long. She also feels down and feels little interest or pleasure in doing things nearly every day. She's not had any negative side effects with the Lexapro.  Review of Systems     Objective:   Physical Exam  Constitutional: She is oriented to person, place, and time. She appears well-developed and well-nourished.  HENT:  Head: Normocephalic and atraumatic.  Eyes: Conjunctivae and EOM are normal.  Cardiovascular: Normal rate.   Pulmonary/Chest: Effort normal.  Neurological: She is alert and oriented to person, place, and time.  Skin: Skin is dry. No pallor.  Psychiatric: She has a normal mood and affect. Her behavior is normal.          Assessment & Plan:  Acute situational depression with anxiety-gad 7 score of 19  today and PHQ 9 score of 25. I discussed with her that even though she is getting some benefit with the Lexapro I am not seeing a significant drop in her symptoms. She hasn't been able to go to therapy regularly because she is without a job right now. We discussed discuss switching her to fluoxetine. Will make that change and I'll see her back in about 5-6 weeks. We can adjust her dose at that time.  Time spent 20 min, > 50% spent counseling about depression and anxiety

## 2014-10-21 NOTE — Patient Instructions (Signed)
Cut the lexapro in half. Take half a tab daily for 5 days and then switch to Lexapro.

## 2014-12-02 ENCOUNTER — Encounter: Payer: Self-pay | Admitting: Family Medicine

## 2014-12-02 ENCOUNTER — Ambulatory Visit (INDEPENDENT_AMBULATORY_CARE_PROVIDER_SITE_OTHER): Payer: Commercial Indemnity | Admitting: Family Medicine

## 2014-12-02 VITALS — BP 141/88 | HR 68 | Ht 68.0 in | Wt 201.0 lb

## 2014-12-02 DIAGNOSIS — Z23 Encounter for immunization: Secondary | ICD-10-CM

## 2014-12-02 DIAGNOSIS — F418 Other specified anxiety disorders: Secondary | ICD-10-CM

## 2014-12-02 NOTE — Progress Notes (Signed)
   Subjective:    Patient ID: Andrea Marquez, female    DOB: 1970/07/31, 44 y.o.   MRN: [id number]  HPI Says the fluoxetine has helps eome but she has noticed she is not sleeping well and she is more sweaty and she is picking at her nails like a nervous habit when she has never done that before.  Says she slept more on the Lexapro.  Says triid splitting the fluxoetine to see if helped with her sleep but it didn't.   She does feel like there's been less conflict with her and her husband lately. They have actually been getting along a little bit better. And her children came to visit last weekend which was a very positive and good experience for her. She does feel like she still worries excessively to the point where it often does keep her awake at night. She has hard time letting things go. And always thinks of the worst possible situation especially for her children. She does report decreased pleasure in doing things more than half the days and still feeling down several days of the week.  She took zoloft years ago but says didn't really take it very long.   She doesn't remember any negative side effects but she is not sure.   Review of Systems     Objective:   Physical Exam  Constitutional: She is oriented to person, place, and time. She appears well-developed and well-nourished.  HENT:  Head: Normocephalic and atraumatic.  Cardiovascular: Normal rate, regular rhythm and normal heart sounds.   Pulmonary/Chest: Effort normal and breath sounds normal.  Neurological: She is alert and oriented to person, place, and time.  Skin: Skin is warm and dry.  Psychiatric: She has a normal mood and affect. Her behavior is normal.          Assessment & Plan:  Depression with anxiety -  She is now on 20 mg of fluoxetine. She is having some side effects are little bit concerning. She's having some difficulty with sleep and is starting to get some nervous habits that she's never had before. We discussed  several options. We could consider going up on the fluoxetine. It may take higher dose to be therapeutic for her and to really hit some of her anxiety component of her mood. Versus we could go back to Lexapro. The she was sleeping much better on the Lexapro. Or we can even consider putting her on Zoloft which may help hit some of the anxiety symptoms. She's not actively enrolled in therapy or counseling because she's not working right now and they don't really have the extra income. I still think this would be incredibly helpful for her. PHQ 9 score of 17 today, previous was 25.  Time spent 20 min, > 50% spent counseling about depression and anxiety

## 2014-12-02 NOTE — Patient Instructions (Signed)
Increase fluoxetine 40mg  daily.

## 2014-12-12 ENCOUNTER — Other Ambulatory Visit: Payer: Self-pay | Admitting: *Deleted

## 2014-12-12 MED ORDER — FLUOXETINE HCL 40 MG PO CAPS: 40.0000 mg | ORAL_CAPSULE | Freq: Every day | ORAL | Status: DC

## 2015-01-01 ENCOUNTER — Ambulatory Visit (INDEPENDENT_AMBULATORY_CARE_PROVIDER_SITE_OTHER): Payer: Commercial Indemnity | Admitting: Family Medicine

## 2015-01-01 ENCOUNTER — Encounter: Payer: Self-pay | Admitting: Family Medicine

## 2015-01-01 VITALS — BP 146/94 | HR 63 | Temp 98.2°F | Resp 18 | Wt 205.8 lb

## 2015-01-01 DIAGNOSIS — F418 Other specified anxiety disorders: Secondary | ICD-10-CM

## 2015-01-01 DIAGNOSIS — R030 Elevated blood-pressure reading, without diagnosis of hypertension: Secondary | ICD-10-CM | POA: Diagnosis not present

## 2015-01-01 DIAGNOSIS — R635 Abnormal weight gain: Secondary | ICD-10-CM | POA: Diagnosis not present

## 2015-01-01 DIAGNOSIS — IMO0001 Reserved for inherently not codable concepts without codable children: Secondary | ICD-10-CM

## 2015-01-01 MED ORDER — SERTRALINE HCL 50 MG PO TABS: ORAL_TABLET | ORAL | Status: DC

## 2015-01-01 MED ORDER — FLUOXETINE HCL 10 MG PO CAPS: ORAL_CAPSULE | ORAL | Status: DC

## 2015-01-01 NOTE — Progress Notes (Signed)
   Subjective:    Patient ID: Andrea Marquez, female    DOB: October 22, 1970, 44 y.o.   MRN: [id number]  HPI Depression/Anxiety - Says not sleeping well and says having nightmares on the medication.  Notices a lot of sweating as well. She is tearful again and fels like having more nervous habits. Feels more angry as well.    Abnormal weight gain - BP was high last OV and today. She has gained about 15 lbs.  No HAs or swelling. She has been trying to exercise regularly.   Review of Systems     Objective:   Physical Exam  Constitutional: She is oriented to person, place, and time. She appears well-developed and well-nourished.  HENT:  Head: Normocephalic and atraumatic.  Eyes: Conjunctivae and EOM are normal.  Cardiovascular: Normal rate.   Pulmonary/Chest: Effort normal.  Neurological: She is alert and oriented to person, place, and time.  Skin: Skin is dry. No pallor.  Psychiatric: She has a normal mood and affect. Her behavior is normal.  Vitals reviewed.         Assessment & Plan:  Depression/anxiety-she's not doing well with the fluoxetine. In fact we are going to wean this and discontinue this over the next 10 days. I met in it to her intolerance list because of nightmares, sweating and insomnia. It may even be somewhat responsible for the weight gain since she really feels like she's been working out and really watching her diet. We will try putting her on sertraline she has never taken that before. Hopefully she'll do well. Also her back in about 5 weeks and then we can adjust her dose if needed. The other consideration would be to go back to Lexapro which she only tried for a month. It was helping but didn't really quite get therapeutic goal for her.  Abnormal weight gain-we'll keep an eye on this. Certainly could be medication related. Did encourage her to continue with diet and exercise and calorie count.  Blood pressure was elevated today. Most likely secondary to recent  weight gain. We'll recheck this again at her follow-up in 5 weeks. If it's still elevated then we will need to consider starting a blood pressure medication.

## 2015-04-07 ENCOUNTER — Ambulatory Visit (INDEPENDENT_AMBULATORY_CARE_PROVIDER_SITE_OTHER): Payer: Managed Care, Other (non HMO) | Admitting: Family Medicine

## 2015-04-07 ENCOUNTER — Encounter: Payer: Self-pay | Admitting: Family Medicine

## 2015-04-07 VITALS — BP 142/92 | HR 69 | Wt 228.0 lb

## 2015-04-07 DIAGNOSIS — IMO0001 Reserved for inherently not codable concepts without codable children: Secondary | ICD-10-CM

## 2015-04-07 DIAGNOSIS — F418 Other specified anxiety disorders: Secondary | ICD-10-CM

## 2015-04-07 DIAGNOSIS — H60542 Acute eczematoid otitis externa, left ear: Secondary | ICD-10-CM

## 2015-04-07 DIAGNOSIS — R030 Elevated blood-pressure reading, without diagnosis of hypertension: Secondary | ICD-10-CM | POA: Diagnosis not present

## 2015-04-07 DIAGNOSIS — I10 Essential (primary) hypertension: Secondary | ICD-10-CM | POA: Insufficient documentation

## 2015-04-07 DIAGNOSIS — H60549 Acute eczematoid otitis externa, unspecified ear: Secondary | ICD-10-CM | POA: Insufficient documentation

## 2015-04-07 DIAGNOSIS — R635 Abnormal weight gain: Secondary | ICD-10-CM | POA: Diagnosis not present

## 2015-04-07 LAB — CBC
HEMATOCRIT: 42.4 % (ref 36.0–46.0)
HEMOGLOBIN: 14.6 g/dL (ref 12.0–15.0)
MCH: 32.8 pg (ref 26.0–34.0)
MCHC: 34.4 g/dL (ref 30.0–36.0)
MCV: 95.3 fL (ref 78.0–100.0)
MPV: 11.3 fL (ref 8.6–12.4)
Platelets: 258 10*3/uL (ref 150–400)
RBC: 4.45 MIL/uL (ref 3.87–5.11)
RDW: 13.3 % (ref 11.5–15.5)
WBC: 6.1 10*3/uL (ref 4.0–10.5)

## 2015-04-07 LAB — T4, FREE: FREE T4: 1 ng/dL (ref 0.8–1.8)

## 2015-04-07 LAB — TSH: TSH: 3.58 mIU/L

## 2015-04-07 MED ORDER — ACETIC ACID 2 % OT SOLN: 4.0000 [drp] | Freq: Three times a day (TID) | OTIC | Status: DC

## 2015-04-07 MED ORDER — BUPROPION HCL ER (XL) 150 MG PO TB24: 150.0000 mg | ORAL_TABLET | ORAL | Status: DC

## 2015-04-07 MED ORDER — VENLAFAXINE HCL ER 37.5 MG PO CP24: 37.5000 mg | ORAL_CAPSULE | Freq: Every day | ORAL | Status: DC

## 2015-04-07 NOTE — Progress Notes (Signed)
Subjective:    Patient ID: Andrea Marquez, female    DOB: August 28, 1970, 45 y.o.   MRN: [id number]  HPI Here for follow-up for depression and anxiety-I last saw her about 3 months ago. We decided to wean her off her fluoxetine because she was concerned about potential for weight gain.  We weaned the fluoxetine and started her on sertraline. She feels like she's not doing well on the medication.She has continued to gain weight and that's been very upsetting and frustrating for her. She actually ran out of the Zoloft that I had put her on and decided to go back to her Lexapro. She was actually supposed to follow up about a month ago.  Feels like a tickle in her left ear for the last couple of weeks. Is wanting to take a look to make sure that don't see a hair or something wrong.    Review of Systems   BP 142/92 mmHg  Pulse 69  Wt 228 lb (103.42 kg)  SpO2 98%    Allergies  Allergen Reactions  . Fluoxetine Other (See Comments)    Sweating, nightmares, insomnia     No past medical history on file.  Past Surgical History  Procedure Laterality Date  . Vaginal hysterectomy    . Oophorectomy      Single   . Breast enhancement surgery      Social History   Social History  . Marital Status: Married    Spouse Name: Andrea Marquez  . Number of Children: 2  . Years of Education: N/A   Occupational History  . Not on file.   Social History Main Topics  . Smoking status: Never Smoker   . Smokeless tobacco: Not on file  . Alcohol Use: Not on file  . Drug Use: No  . Sexual Activity: No   Other Topics Concern  . Not on file   Social History Narrative   Daily caffeine. Works on regularly.     No family history on file.  Outpatient Encounter Prescriptions as of 04/07/2015  Medication Sig  . acetic acid (VOSOL) 2 % otic solution Place 4 drops into the left ear 3 (three) times daily.  Marland Kitchen buPROPion (WELLBUTRIN XL) 150 MG 24 hr tablet Take 1 tablet (150 mg total) by mouth every morning.   . venlafaxine XR (EFFEXOR XR) 37.5 MG 24 hr capsule Take 1 capsule (37.5 mg total) by mouth daily. Ok to increase to 2 tabs after 1 week.  . [DISCONTINUED] FLUoxetine (PROZAC) 10 MG capsule Take 2 po QD x 5 days, then decrease to 10 mg po QD x 5 day.  . [DISCONTINUED] sertraline (ZOLOFT) 50 MG tablet 1/2 tab daily for 5 days, then increase to whole tab daily.   No facility-administered encounter medications on file as of 04/07/2015.          Objective:   Physical Exam  Constitutional: She is oriented to person, place, and time. She appears well-developed and well-nourished.  HENT:  Head: Normocephalic and atraumatic.  Left Ear: External ear normal.  Nose: Nose normal.  TMs and canals are clear, bilaterally   Neck: Neck supple. No thyromegaly present.  Cardiovascular: Normal rate, regular rhythm and normal heart sounds.   Pulmonary/Chest: Effort normal and breath sounds normal.  Lymphadenopathy:    She has no cervical adenopathy.  Neurological: She is alert and oriented to person, place, and time.  Skin: Skin is warm and dry.  Psychiatric: She has a normal mood and affect. Her  behavior is normal.          Assessment & Plan:  Depression/anxiety-PHQ 9 score of 17 today and got 7 score of 20.We discussed several options. She has never tried Effexor. Will recommend a trial of an S NRI. Will start with 37.5 mg and increase to 2 tabs after a week or 2. We also discussed possibly adding Wellbutrin to see if this may help as well as help control her weight.  Elevated blood pressure- elevated today. Will monitor. She is anxious today. She is leaving to drive to [place]   Abnormal weight gain - will check TSH and free t4. Discussed nutrition therapy. She plans on starting with [employer].  Will start metformin.    Tickle in her left ear-could be dermatitis. The canal itself looks clear. In fact she may actually be removing too much cerumen explain how this can actually provide a  protective coating. Recommend a trial of those all drops.

## 2015-04-08 NOTE — Addendum Note (Signed)
Addended by: Teddy Spike on: 04/08/2015 02:55 PM   Modules accepted: Orders

## 2015-04-15 LAB — CORTISOL-AM, BLOOD: CORTISOL - AM: 18.9 ug/dL

## 2015-06-03 ENCOUNTER — Other Ambulatory Visit: Payer: Self-pay | Admitting: Family Medicine

## 2015-08-04 ENCOUNTER — Ambulatory Visit: Payer: Managed Care, Other (non HMO) | Admitting: Family Medicine

## 2015-08-21 ENCOUNTER — Ambulatory Visit (INDEPENDENT_AMBULATORY_CARE_PROVIDER_SITE_OTHER): Payer: 59 | Admitting: Licensed Clinical Social Worker

## 2015-08-21 ENCOUNTER — Encounter (HOSPITAL_COMMUNITY): Payer: Self-pay | Admitting: Licensed Clinical Social Worker

## 2015-08-21 ENCOUNTER — Encounter: Payer: Self-pay | Admitting: Family Medicine

## 2015-08-21 ENCOUNTER — Ambulatory Visit (INDEPENDENT_AMBULATORY_CARE_PROVIDER_SITE_OTHER): Payer: Managed Care, Other (non HMO) | Admitting: Family Medicine

## 2015-08-21 VITALS — BP 137/93 | HR 106 | Wt 223.0 lb

## 2015-08-21 DIAGNOSIS — F411 Generalized anxiety disorder: Secondary | ICD-10-CM

## 2015-08-21 DIAGNOSIS — F332 Major depressive disorder, recurrent severe without psychotic features: Secondary | ICD-10-CM | POA: Diagnosis not present

## 2015-08-21 DIAGNOSIS — R748 Abnormal levels of other serum enzymes: Secondary | ICD-10-CM | POA: Diagnosis not present

## 2015-08-21 DIAGNOSIS — R635 Abnormal weight gain: Secondary | ICD-10-CM | POA: Diagnosis not present

## 2015-08-21 DIAGNOSIS — F418 Other specified anxiety disorders: Secondary | ICD-10-CM | POA: Diagnosis not present

## 2015-08-21 MED ORDER — HYDROXYZINE HCL 25 MG PO TABS
25.0000 mg | ORAL_TABLET | Freq: Three times a day (TID) | ORAL | 0 refills | Status: DC | PRN
Start: 2015-08-21 — End: 2015-09-30

## 2015-08-21 MED ORDER — PAROXETINE HCL 20 MG PO TABS: ORAL_TABLET | ORAL | 0 refills | Status: DC
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
## 2015-08-22 LAB — HEPATIC FUNCTION PANEL
ALBUMIN: 4.6 g/dL (ref 3.6–5.1)
ALT: 18 U/L (ref 6–29)
AST: 16 U/L (ref 10–35)
Alkaline Phosphatase: 49 U/L (ref 33–115)
Bilirubin, Direct: 0.1 mg/dL (ref <=0.2)
Indirect Bilirubin: 0.5 mg/dL (ref 0.2–1.2)
TOTAL PROTEIN: 6.9 g/dL (ref 6.1–8.1)
Total Bilirubin: 0.6 mg/dL (ref 0.2–1.2)

## 2015-09-08 ENCOUNTER — Other Ambulatory Visit: Payer: Self-pay | Admitting: Family Medicine

## 2015-09-30 ENCOUNTER — Other Ambulatory Visit: Payer: Self-pay | Admitting: Family Medicine

## 2015-09-30 MED ORDER — PAROXETINE HCL 20 MG PO TABS: ORAL_TABLET | ORAL | 0 refills | Status: DC

## 2015-09-30 MED ORDER — HYDROXYZINE HCL 25 MG PO TABS: 25.0000 mg | ORAL_TABLET | Freq: Three times a day (TID) | ORAL | 0 refills | Status: DC | PRN

## 2015-10-02 ENCOUNTER — Ambulatory Visit (INDEPENDENT_AMBULATORY_CARE_PROVIDER_SITE_OTHER): Payer: Managed Care, Other (non HMO) | Admitting: Family Medicine

## 2015-10-02 ENCOUNTER — Encounter: Payer: Self-pay | Admitting: Family Medicine

## 2015-10-02 VITALS — BP 122/75 | HR 79 | Ht 68.0 in | Wt 219.0 lb

## 2015-10-02 DIAGNOSIS — F332 Major depressive disorder, recurrent severe without psychotic features: Secondary | ICD-10-CM

## 2015-10-02 MED ORDER — HYDROXYZINE HCL 25 MG PO TABS: 25.0000 mg | ORAL_TABLET | Freq: Three times a day (TID) | ORAL | 0 refills | Status: DC | PRN

## 2015-10-02 MED ORDER — PAROXETINE HCL 40 MG PO TABS: 40.0000 mg | ORAL_TABLET | ORAL | 0 refills | Status: DC

## 2015-10-02 NOTE — Progress Notes (Signed)
Subjective:    CC: F/U major depressive D/O  HPI: Here for follow-up for severe depressive disorder. When I last saw patient she had recently attempted suicide. Is very concerned for her safety so we got her in immediately for same-day appointment with behavioral health downstairs. They did a very thorough assessment. They did contract with her for safety and she was able to go home. She's now on Paxil and up to 40 mg and feels like she is doing very well on it. She says she feels much more even keel she has not been back to therapy or counseling. She does have a job interview this afternoon. She has been unemployed for quite some time and has been trying to find a new job with a similar salary to her previous one. She is not had any significant side effects on the Paxil and says she would like to try possibly increasing the dose just slightly. Alden Benjamin feeling down and depressed more than half the days and occasionally still having thoughts of being better off dead but no thoughts of actively harming herself right now. She does complain of feeling nervous and anxious nearly every day. She said she's really just had a lot of trouble relaxing. She says even things that normally relaxer like sitting on her back steps and looking out at her yard that she helped create implant is not helping her to an wine. She just starts thinking about all the things that she needs to do incomplete. And then starts to feel overly anxious.   Past medical history, Surgical history, Family history not pertinant except as noted below, Social history, Allergies, and medications have been entered into the medical record, reviewed, and corrections made.   Review of Systems: No fevers, chills, night sweats, weight loss, chest pain, or shortness of breath.   Objective:    General: Well Developed, well nourished, and in no acute distress.  Neuro: Alert and oriented x3, extra-ocular muscles intact, sensation grossly intact.  HEENT:  Normocephalic, atraumatic  Skin: Warm and dry, no rashes. Cardiac: Regular rate and rhythm, no murmurs rubs or gallops, no lower extremity edema.  Respiratory: Clear to auscultation bilaterally. Not using accessory muscles, speaking in full sentences.   Impression and Recommendations:    Severe recurrent depressive disorder-PHQ 9 score of 19 today and dad 7 score of 20. This is still in the severe depression and severe anxiety category. Though she rates her symptoms is somewhat difficult. We will go ahead and increase Paxil to 50 mg. She just filled a 29 g tabs sister she will cut those in half and take a half of a tab along with a 40 mg tab for a total of 50 mg daily. I will see her back in about 6 weeks. I strongly strongly encouraged her to make an appointment for therapy and counseling. I know that this is financially a struggle until she is able to find a job that I just think it so important to her getting better overall.  Time spent 20 minutes, greater 50% time spent counseling about depression and anxiety.

## 2015-10-24 ENCOUNTER — Other Ambulatory Visit: Payer: Self-pay | Admitting: Family Medicine

## 2015-10-28 MED ORDER — PAROXETINE HCL 40 MG PO TABS: 40.0000 mg | ORAL_TABLET | ORAL | 0 refills | Status: DC

## 2015-10-28 MED ORDER — HYDROXYZINE HCL 25 MG PO TABS: 25.0000 mg | ORAL_TABLET | Freq: Three times a day (TID) | ORAL | 0 refills | Status: DC | PRN

## 2016-04-29 ENCOUNTER — Ambulatory Visit (INDEPENDENT_AMBULATORY_CARE_PROVIDER_SITE_OTHER): Payer: 59 | Admitting: Sports Medicine

## 2016-04-29 ENCOUNTER — Encounter: Payer: Self-pay | Admitting: Sports Medicine

## 2016-04-29 ENCOUNTER — Other Ambulatory Visit: Payer: Self-pay | Admitting: Sports Medicine

## 2016-04-29 ENCOUNTER — Ambulatory Visit (INDEPENDENT_AMBULATORY_CARE_PROVIDER_SITE_OTHER): Payer: 59

## 2016-04-29 DIAGNOSIS — M25561 Pain in right knee: Secondary | ICD-10-CM | POA: Diagnosis not present

## 2016-04-29 DIAGNOSIS — M6752 Plica syndrome, left knee: Secondary | ICD-10-CM

## 2016-04-29 DIAGNOSIS — M6751 Plica syndrome, right knee: Secondary | ICD-10-CM

## 2016-04-29 MED ORDER — MAGNESIUM OXIDE 400 MG PO TABS: 800.0000 mg | ORAL_TABLET | Freq: Every day | ORAL | 3 refills | Status: DC

## 2016-04-29 NOTE — Patient Instructions (Signed)
Plica Syndrome Plica syndrome is a painful knee condition. Plica syndrome happens when folds of tissue in the knee called plica get swollen and rub against the kneecap or thigh bone. What are the causes? This condition can be caused by:  Bending or twisting the knee over and over again.  A hit to the knee. What increases the risk? The following factors make you more likely to develop this condition:  Having hip or thigh muscles that are weak or tight.  Having hip or foot problems that change the normal position of the knee.  Having had a previous knee injury.  Playing contact sports.  Participating in activities that involve making the same knee movements over and over, like running, cycling, or swimming. What are the signs or symptoms? The main symptom of this condition is a dull pain in the front or side of the knee. The pain comes and goes. It may get better with rest, and it may get worse with activities like standing, kneeling, walking, running, or climbing stairs. Other symptoms of this condition include:  Pain when pressing on your knee.  A clicking or snapping feeling when you bend your knee.  Feeling that your knee is locking or catching.  Feeling like your knee is giving way (instability). How is this diagnosed? This condition may be diagnosed based on:  Your symptoms.  Your medical history.  A physical exam.  Imaging tests, such as MRI or ultrasound.  A procedure to look inside your knee joint (arthroscopy). During the physical exam, your health care provider may move your knee in different directions and feel your knee to check for pain and tenderness. How is this treated? This condition may be treated by:  Resting your knee until pain and swelling go down.  Avoiding activities that make pain worse.  Icing your knee.  Wearing a supportive sleeve around your knee.  Taking medicine to reduce pain and inflammation.  Getting injections in your  knee.  Starting range-of-motion and strengthening exercises (physical therapy) to restore full movement and to strengthen your thigh muscles. If these treatments do not help after 6 months, you may need to have surgery to remove the swollen parts of your plica. Follow these instructions at home: If You Have a Sleeve:   Wear it as told by your health care provider. Remove it only as told by your health care provider.  Remove the sleeve if your toes tingle, become numb, or turn cold and blue.  Do not let your sleeve get wet if it is not waterproof.  Keep the sleeve clean. Managing pain, stiffness, and swelling   If directed, apply ice to your knee.  Put ice in a plastic bag.  Place a towel between your skin and the bag.  Leave the ice on for 20 minutes, 2-3 times a day.  Raise (elevate) your knee to the level of your heart or above that level while you are sitting or lying down.  Take over-the-counter and prescription medicines only as told by your health care provider. Driving   Do not drive or operate heavy machinery while taking prescription pain medicine.  Ask your health care provider when it is safe for you to drive. Activity   Return to your normal activities as told by your health care provider. Ask your health care provider what activities are safe for you.  Do exercises as told by your health care provider. General instructions   Do not use any tobacco products, such as cigarettes,  chewing tobacco, and e-cigarettes. Tobacco can delay healing. If you need help quitting, ask your health care provider.  Keep all follow-up visits as told by your health care provider. This is important. How is this prevented?  Warm up and stretch before being active.  Cool down and stretch after being active.  Give your body time to rest between periods of activity.  Make sure to use equipment that fits you.  Be safe and responsible while being active to avoid falls.  Maintain  physical fitness, including:  Strength.  Flexibility. Contact a health care provider if:  Your symptoms get worse.  Your symptoms have not improved after 6 months. This information is not intended to replace advice given to you by your health care provider. Make sure you discuss any questions you have with your health care provider. Document Released: 01/11/2005 Document Revised: 09/16/2015 Document Reviewed: 12/20/2014 Elsevier Interactive Patient Education  2017 [employer].

## 2016-04-29 NOTE — Assessment & Plan Note (Signed)
Clinically this feels to be a medial patellar plica. Meloxicam, home rehabilitation exercises, x-rays. Return in one month, injection of the plical fold if no better.

## 2016-04-29 NOTE — Progress Notes (Signed)
   Subjective:    I'm seeing this patient as a consultation for:  Dr. Beatrice Lecher   CC: Right knee pain  HPI: This is a pleasant 46 year old female, she's been trying to get back into running so she can lose weight, unfortunately has developed severe pain, sharp and stabbing to the medial aspect of her right patella with occasional catching and popping. Only mild swelling, no radiation, no trauma.  Past medical history:  Negative.  See flowsheet/record as well for more information.  Surgical history: Negative.  See flowsheet/record as well for more information.  Family history: Negative.  See flowsheet/record as well for more information.  Social history: Negative.  See flowsheet/record as well for more information.  Allergies, and medications have been entered into the medical record, reviewed, and no changes needed.   Review of Systems: No headache, visual changes, nausea, vomiting, diarrhea, constipation, dizziness, abdominal pain, skin rash, fevers, chills, night sweats, weight loss, swollen lymph nodes, body aches, joint swelling, muscle aches, chest pain, shortness of breath, mood changes, visual or auditory hallucinations.   Objective:   General: Well Developed, well nourished, and in no acute distress.  Neuro/Psych: Alert and oriented x3, extra-ocular muscles intact, able to move all 4 extremities, sensation grossly intact. Skin: Warm and dry, no rashes noted.  Respiratory: Not using accessory muscles, speaking in full sentences, trachea midline.  Cardiovascular: Pulses palpable, no extremity edema. Abdomen: Does not appear distended. Right Knee: Normal to inspection with no erythema or effusion or obvious bony abnormalities. Palpation normal with no warmth or joint line tenderness or patellar tenderness or condyle tenderness. I am able to palpate a tender mediopatellar plica. ROM normal in flexion and extension and lower leg rotation. Ligaments with solid consistent  endpoints including ACL, PCL, LCL, MCL. Negative Mcmurray's and provocative meniscal tests. Non painful patellar compression. Patellar and quadriceps tendons unremarkable. Hamstring and quadriceps strength is normal.  Impression and Recommendations:   This case required medical decision making of moderate complexity.  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 Meloxicam, home rehabilitation exercises, x-rays. Return in one month, injection of the plical fold if no better.

## 2016-04-30 ENCOUNTER — Ambulatory Visit: Payer: Self-pay | Admitting: Sports Medicine

## 2017-05-23 ENCOUNTER — Encounter: Payer: Self-pay | Admitting: Family Medicine

## 2017-05-23 ENCOUNTER — Ambulatory Visit (INDEPENDENT_AMBULATORY_CARE_PROVIDER_SITE_OTHER): Payer: 59 | Admitting: Family Medicine

## 2017-05-23 ENCOUNTER — Telehealth: Payer: Self-pay

## 2017-05-23 VITALS — BP 131/82 | HR 68 | Ht 68.0 in | Wt 216.0 lb

## 2017-05-23 DIAGNOSIS — F418 Other specified anxiety disorders: Secondary | ICD-10-CM

## 2017-05-23 DIAGNOSIS — Z0000 Encounter for general adult medical examination without abnormal findings: Secondary | ICD-10-CM | POA: Diagnosis not present

## 2017-05-23 DIAGNOSIS — R41840 Attention and concentration deficit: Secondary | ICD-10-CM | POA: Diagnosis not present

## 2017-05-23 LAB — COMPLETE METABOLIC PANEL WITH GFR
AG RATIO: 1.9 (calc) (ref 1.0–2.5)
ALT: 13 U/L (ref 6–29)
AST: 13 U/L (ref 10–35)
Albumin: 4.5 g/dL (ref 3.6–5.1)
Alkaline phosphatase (APISO): 50 U/L (ref 33–115)
BILIRUBIN TOTAL: 0.5 mg/dL (ref 0.2–1.2)
BUN: 12 mg/dL (ref 7–25)
CALCIUM: 9.2 mg/dL (ref 8.6–10.2)
CHLORIDE: 106 mmol/L (ref 98–110)
CO2: 30 mmol/L (ref 20–32)
Creat: 0.65 mg/dL (ref 0.50–1.10)
GFR, EST AFRICAN AMERICAN: 122 mL/min/{1.73_m2} (ref >=60)
GFR, EST NON AFRICAN AMERICAN: 105 mL/min/{1.73_m2} (ref >=60)
GLOBULIN: 2.4 g/dL (ref 1.9–3.7)
Glucose, Bld: 99 mg/dL (ref 65–99)
POTASSIUM: 4.6 mmol/L (ref 3.5–5.3)
SODIUM: 140 mmol/L (ref 135–146)
TOTAL PROTEIN: 6.9 g/dL (ref 6.1–8.1)

## 2017-05-23 LAB — LIPID PANEL
Cholesterol: 205 mg/dL — ABNORMAL HIGH (ref <200)
HDL: 62 mg/dL (ref >50)
LDL Cholesterol (Calc): 125 mg/dL (calc) — ABNORMAL HIGH
NON-HDL CHOLESTEROL (CALC): 143 mg/dL — AB (ref <130)
Total CHOL/HDL Ratio: 3.3 (calc) (ref <5.0)
Triglycerides: 82 mg/dL (ref <150)

## 2017-05-23 LAB — CBC
HCT: 41.4 % (ref 35.0–45.0)
HEMOGLOBIN: 14.3 g/dL (ref 11.7–15.5)
MCH: 32.1 pg (ref 27.0–33.0)
MCHC: 34.5 g/dL (ref 32.0–36.0)
MCV: 93 fL (ref 80.0–100.0)
MPV: 12.1 fL (ref 7.5–12.5)
Platelets: 260 10*3/uL (ref 140–400)
RBC: 4.45 10*6/uL (ref 3.80–5.10)
RDW: 12.5 % (ref 11.0–15.0)
WBC: 5.3 10*3/uL (ref 3.8–10.8)

## 2017-05-23 MED ORDER — HYDROXYZINE HCL 25 MG PO TABS: 25.0000 mg | ORAL_TABLET | Freq: Three times a day (TID) | ORAL | 3 refills | Status: DC | PRN

## 2017-05-23 MED ORDER — PAROXETINE HCL 40 MG PO TABS: ORAL_TABLET | ORAL | 1 refills | Status: DC

## 2017-05-23 NOTE — Progress Notes (Addendum)
Subjective:     Andrea Marquez is a 47 y.o. female and is here for a comprehensive physical exam. The patient reports no problems.  She came off her paxil about a year ago and has been able to lose 15 lbs. Started exercising.  More recently though she is been feeling down again.  Her husband mostly works out of town but comes home on the weekends but overall this is actually been good for her.  She says she is just getting to the point where she does not want to get out of bed again.  She would like to restart her Paxil.  She would like to be tested for ADHD.  Both her children have been diagnosed.  She notices a lot of similarities between her and her kids.  Right now her daughter is on medication but her son is in second year law school and is not.  Social History   Socioeconomic History  . Marital status: Married    Spouse name: Andrea Marquez  . Number of children: 2  . Years of education: Not on file  . Highest education level: Not on file  Occupational History  . Not on file  Social Needs  . Financial resource strain: Not on file  . Food insecurity:    Worry: Not on file    Inability: Not on file  . Transportation needs:    Medical: Not on file    Non-medical: Not on file  Tobacco Use  . Smoking status: Never Smoker  . Smokeless tobacco: Never Used  Substance and Sexual Activity  . Alcohol use: Not on file  . Drug use: No  . Sexual activity: Never  Lifestyle  . Physical activity:    Days per week: Not on file    Minutes per session: Not on file  . Stress: Not on file  Relationships  . Social connections:    Talks on phone: Not on file    Gets together: Not on file    Attends religious service: Not on file    Active member of club or organization: Not on file    Attends meetings of clubs or organizations: Not on file    Relationship status: Not on file  . Intimate partner violence:    Fear of current or ex partner: Not on file    Emotionally abused: Not on file   Physically abused: Not on file    Forced sexual activity: Not on file  Other Topics Concern  . Not on file  Social History Narrative   Daily caffeine. Works on regularly.    Health Maintenance  Topic Date Due  . INFLUENZA VACCINE  08/25/2017  . TETANUS/TDAP  05/30/2023  . HIV Screening  Completed    The following portions of the patient's history were reviewed and updated as appropriate: allergies, current medications, past family history, past medical history, past social history, past surgical history and problem list.  Review of Systems A comprehensive review of systems was negative.   Objective:    There were no vitals taken for this visit. General appearance: alert, cooperative and appears stated age Head: Normocephalic, without obvious abnormality, atraumatic Eyes: conj clear, EOMi, PEERLA Ears: normal TM's and external ear canals both ears Nose: Nares normal. Septum midline. Mucosa normal. No drainage or sinus tenderness. Throat: lips, mucosa, and tongue normal; teeth and gums normal Neck: no adenopathy, no carotid bruit, no JVD, supple, symmetrical, trachea midline and thyroid not enlarged, symmetric, no tenderness/mass/nodules Back: symmetric, no  curvature. ROM normal. No CVA tenderness. Lungs: clear to auscultation bilaterally Breasts: normal appearance, no masses or tenderness Heart: regular rate and rhythm, S1, S2 normal, no murmur, click, rub or gallop Abdomen: soft, non-tender; bowel sounds normal; no masses,  no organomegaly Pelvic: deferred Extremities: extremities normal, atraumatic, no cyanosis or edema Pulses: 2+ and symmetric Skin: Skin color, texture, turgor normal. No rashes or lesions Lymph nodes: Cervical, supraclavicular, and axillary nodes normal. Neurologic: Alert and oriented X 3, normal strength and tone. Normal symmetric reflexes. Normal coordination and gait    Assessment:    Healthy female exam.      Plan:     See After Visit Summary for  Counseling Recommendations   Keep up a regular exercise program and make sure you are eating a healthy diet Try to eat 4 servings of dairy a day, or if you are lactose intolerant take a calcium with vitamin D daily.  Your vaccines are up to date.   ADHD -screening for ADHD performed today.  Score of 48. Positive screen for aDHD.  Will refer for formal diagnostic testing with Andrea Marquez.  Depression with anxiety-we will go ahead and restart Paxil.  She is been on it before and did well so I would just like to see her back in about 6 months.  PHQ 9 score of 24.  And gad 7 score of 21.  She rates her symptoms is very difficult.

## 2017-05-23 NOTE — Patient Instructions (Signed)

## 2017-05-23 NOTE — Telephone Encounter (Signed)
Okay, new prescription sent.  Sorry for the delay on that.  But it should be ready to pick up this afternoon.

## 2017-05-23 NOTE — Progress Notes (Signed)
   Subjective:    Patient ID: Andrea Marquez, female    DOB: Feb 22, 1970, 47 y.o.   MRN: [id number]  HPI    Review of Systems     Objective:   Physical Exam        Assessment & Plan:

## 2017-05-23 NOTE — Telephone Encounter (Signed)
Pt advised.

## 2017-05-23 NOTE — Telephone Encounter (Signed)
Andrea Marquez called and states the pharmacy didn't receive the refill for Hydroxyzine. Please advise.

## 2017-05-24 LAB — THYROID PROFILE - CHCC
FREE THYROXINE INDEX: 1.6 (ref 1.4–3.8)
T3 Uptake: 31 % (ref 22–35)
T4, Total: 5.2 ug/dL (ref 5.1–11.9)

## 2017-08-19 ENCOUNTER — Ambulatory Visit: Payer: 59 | Admitting: Psychology

## 2017-08-19 DIAGNOSIS — F411 Generalized anxiety disorder: Secondary | ICD-10-CM

## 2017-08-19 DIAGNOSIS — F902 Attention-deficit hyperactivity disorder, combined type: Secondary | ICD-10-CM

## 2017-08-19 DIAGNOSIS — F331 Major depressive disorder, recurrent, moderate: Secondary | ICD-10-CM | POA: Diagnosis not present

## 2017-09-21 ENCOUNTER — Ambulatory Visit (INDEPENDENT_AMBULATORY_CARE_PROVIDER_SITE_OTHER): Payer: 59 | Admitting: Psychology

## 2017-09-21 DIAGNOSIS — F331 Major depressive disorder, recurrent, moderate: Secondary | ICD-10-CM

## 2017-09-21 DIAGNOSIS — F411 Generalized anxiety disorder: Secondary | ICD-10-CM

## 2017-09-21 DIAGNOSIS — F4010 Social phobia, unspecified: Secondary | ICD-10-CM | POA: Diagnosis not present

## 2017-09-21 DIAGNOSIS — F902 Attention-deficit hyperactivity disorder, combined type: Secondary | ICD-10-CM

## 2017-10-07 ENCOUNTER — Encounter: Payer: Self-pay | Admitting: Family Medicine

## 2017-10-07 ENCOUNTER — Ambulatory Visit (INDEPENDENT_AMBULATORY_CARE_PROVIDER_SITE_OTHER): Payer: 59

## 2017-10-07 ENCOUNTER — Ambulatory Visit (INDEPENDENT_AMBULATORY_CARE_PROVIDER_SITE_OTHER): Payer: 59 | Admitting: Family Medicine

## 2017-10-07 VITALS — BP 147/90 | HR 69 | Ht 68.0 in | Wt 225.0 lb

## 2017-10-07 DIAGNOSIS — M545 Low back pain, unspecified: Secondary | ICD-10-CM

## 2017-10-07 DIAGNOSIS — M79605 Pain in left leg: Secondary | ICD-10-CM

## 2017-10-07 DIAGNOSIS — F902 Attention-deficit hyperactivity disorder, combined type: Secondary | ICD-10-CM | POA: Diagnosis not present

## 2017-10-07 DIAGNOSIS — M533 Sacrococcygeal disorders, not elsewhere classified: Secondary | ICD-10-CM

## 2017-10-07 MED ORDER — AMPHETAMINE-DEXTROAMPHET ER 15 MG PO CP24: 15.0000 mg | ORAL_CAPSULE | ORAL | 0 refills | Status: DC

## 2017-10-07 NOTE — Progress Notes (Signed)
Subjective:    Patient ID: Andrea Marquez, female    DOB: October 20, 1970, 47 y.o.   MRN: [id number]  HPI 47 year old female here today for follow-up for evaluation for ADD.  She did have a formal consultation and testing with Dr. Glennon Hamilton at [facility] location.  She is here today to review those results.  Unfortunately, I do not have an actual copy of the report but she tells me that she was diagnosed with ADHD and that they went over those results together.  She is here to discuss more definitive treatment options and see what may be available.   She also reports that she has had some pain in her tailbone area for about a year since she actually had an injury.  In January 2018 she actually fell down some steps and hit her bottom.  Then in June 2018 she actually fell down some steps again and hit her bottom.  She says the tailbone area will still occasionally feels sore and sometimes will pop with certain motions such as sitting down.  She is actually never been seen for this by a provider or evaluated any further.  More recently she is tried to start working out and exercising again and the pain has made it more difficult for her to jog again.  She is had a little bit of lumbar back pain as well.  Particularly when she runs it will hurt across her low back and radiate to the right and the left side.  It will occasionally radiate down her left outer leg in the sciatic distribution.  No current treatment.  No alleviating factors.  Review of Systems  BP (!) 147/90   Pulse 69   Ht 5\' 8"  (1.727 m)   Wt 225 lb (102.1 kg)   SpO2 98%   BMI 34.21 kg/m     Allergies  Allergen Reactions  . Fluoxetine Other (See Comments)    Sweating, nightmares, insomnia     No past medical history on file.  Past Surgical History:  Procedure Laterality Date  . BREAST ENHANCEMENT SURGERY    . OOPHORECTOMY     Single   . VAGINAL HYSTERECTOMY      Social History   Socioeconomic  History  . Marital status: Married    Spouse name: Randall Hiss  . Number of children: 2  . Years of education: Not on file  . Highest education level: Not on file  Occupational History  . Not on file  Social Needs  . Financial resource strain: Not on file  . Food insecurity:    Worry: Not on file    Inability: Not on file  . Transportation needs:    Medical: Not on file    Non-medical: Not on file  Tobacco Use  . Smoking status: Never Smoker  . Smokeless tobacco: Never Used  Substance and Sexual Activity  . Alcohol use: Not on file  . Drug use: No  . Sexual activity: Never  Lifestyle  . Physical activity:    Days per week: Not on file    Minutes per session: Not on file  . Stress: Not on file  Relationships  . Social connections:    Talks on phone: Not on file    Gets together: Not on file    Attends religious service: Not on file    Active member of club or organization: Not on file    Attends meetings of clubs or organizations: Not on file  Relationship status: Not on file  . Intimate partner violence:    Fear of current or ex partner: Not on file    Emotionally abused: Not on file    Physically abused: Not on file    Forced sexual activity: Not on file  Other Topics Concern  . Not on file  Social History Narrative   Daily caffeine. Works on regularly.     No family history on file.  Outpatient Encounter Medications as of 10/07/2017  Medication Sig  . hydrOXYzine (ATARAX/VISTARIL) 25 MG tablet Take 1 tablet (25 mg total) by mouth 3 (three) times daily as needed for anxiety.  Marland Kitchen PARoxetine (PAXIL) 40 MG tablet 1/2 tab po QD x 10 days, the increase to whole tab daily  . amphetamine-dextroamphetamine (ADDERALL XR) 15 MG 24 hr capsule Take 1 capsule by mouth every morning.   No facility-administered encounter medications on file as of 10/07/2017.          Objective:   Physical Exam  Constitutional: She is oriented to person, place, and time. She appears  well-developed and well-nourished.  HENT:  Head: Normocephalic and atraumatic.  Eyes: Conjunctivae and EOM are normal.  Cardiovascular: Normal rate.  Pulmonary/Chest: Effort normal.  Musculoskeletal:  Nontender over the lumbar spine, sacrum, or coccyx.  Normal lumbar flexion and extension with rotation right and left and normal side bending.  Negative straight leg raise bilaterally.  Hip, knee, ankle strength is 5 and 5.  Reflexes normal.  Neurological: She is alert and oriented to person, place, and time.  Skin: Skin is dry. No pallor.  Psychiatric: She has a normal mood and affect. Her behavior is normal.  Vitals reviewed.         Assessment & Plan:  ADHD-she has reviewed the report in detail with Dr. Glennon Hamilton.  Unfortunately I did not have a copy while she was here for the appointment but we have called and requested that they fax it over soon as possible.  We did go ahead and discuss treatment options.  We discussed pros and cons and potential side effects of stimulants.  We will start with 15 mg of extended release Adderall and then I will see her back in about 3 to 4 weeks to make sure that she is tolerating it well, check blood pressure for elevation, and adjust dose at that time if needed.  Did warn about stopping the medication immediately if she experience any chest pain or palpitations.  We also discussed utilizing her resources that were given to her by Dr. Glennon Hamilton in regards to additional information, what looks etc. and how to deal with ADHD.  Coccygeal pain-status post trauma a year and a half ago.  I suspect she likely broke the coccyx.  It sounds like it still somewhat mobile as she is getting pain and clicking in that area.  We will get x-rays to confirm.  Unfortunately there may not be a lot to do to actually improve her discomfort and pain that we can always refer her to sports medicine if needed.  Low back pain with left-sided radicular symptoms-we will get lumbar x-ray as well.   Suspect she probably has a herniated disc.  Might benefit from physical therapy if recurrence of the sciatic pain.Marland Kitchen

## 2017-10-09 ENCOUNTER — Encounter: Payer: Self-pay | Admitting: Family Medicine

## 2017-10-20 ENCOUNTER — Ambulatory Visit: Payer: 59 | Admitting: Psychology

## 2017-10-24 ENCOUNTER — Encounter: Payer: Self-pay | Admitting: Sports Medicine

## 2017-10-24 ENCOUNTER — Ambulatory Visit: Payer: 59 | Admitting: Sports Medicine

## 2017-10-24 DIAGNOSIS — R319 Hematuria, unspecified: Secondary | ICD-10-CM

## 2017-10-24 DIAGNOSIS — M533 Sacrococcygeal disorders, not elsewhere classified: Secondary | ICD-10-CM

## 2017-10-24 DIAGNOSIS — M5416 Radiculopathy, lumbar region: Secondary | ICD-10-CM | POA: Diagnosis not present

## 2017-10-24 MED ORDER — CYCLOBENZAPRINE HCL 10 MG PO TABS: ORAL_TABLET | ORAL | 0 refills | Status: DC

## 2017-10-24 MED ORDER — IBUPROFEN 800 MG PO TABS: 800.0000 mg | ORAL_TABLET | Freq: Three times a day (TID) | ORAL | 2 refills | Status: DC | PRN

## 2017-10-24 MED ORDER — PREDNISONE 50 MG PO TABS: ORAL_TABLET | ORAL | 0 refills | Status: DC

## 2017-10-24 NOTE — Progress Notes (Signed)
Subjective:    CC: Several issues  HPI: Back pain: This is a pleasant 47 year old female, she has a history of lumbar degenerative disc disease, she is post epidurals, did extremely well, this was 5 years ago.  Since then she has had a recurrence of pain, moderate, persistent, localized to the low back, worse with sitting, flexion, Valsalva, no bowel or bladder dysfunction, saddle numbness, constitutional symptoms.  Nothing radicular, all axial pain.  In addition back in January she fell down the stairs, she tells me her butt bounced down every single step, she had severe pain at the coccyx.  This improved considerably over the past 8 months but she does have persistent pain with a clicking sensation.  Mild, persistent.  Lastly she was noted to have a left-sided renal pelvic nephrolith, she does note hematuria, gross over the past few weeks after long runs of 5 miles or so.  Does not get the symptoms on the elliptical machine.  When she is getting the blood she developed some mild left flank pain.  No severe myalgias.  She has been using at thousand milligrams of aspirin daily, sometimes more.  I reviewed the past medical history, family history, social history, surgical history, and allergies today and no changes were needed.  Please see the problem list section below in epic for further details.  Past Medical History: No past medical history on file. Past Surgical History: Past Surgical History:  Procedure Laterality Date  . BREAST ENHANCEMENT SURGERY    . OOPHORECTOMY     Single   . VAGINAL HYSTERECTOMY     Social History: Social History   Socioeconomic History  . Marital status: Married    Spouse name: Randall Hiss  . Number of children: 2  . Years of education: Not on file  . Highest education level: Not on file  Occupational History  . Not on file  Social Needs  . Financial resource strain: Not on file  . Food insecurity:    Worry: Not on file    Inability: Not on file  .  Transportation needs:    Medical: Not on file    Non-medical: Not on file  Tobacco Use  . Smoking status: Never Smoker  . Smokeless tobacco: Never Used  Substance and Sexual Activity  . Alcohol use: Not on file  . Drug use: No  . Sexual activity: Never  Lifestyle  . Physical activity:    Days per week: Not on file    Minutes per session: Not on file  . Stress: Not on file  Relationships  . Social connections:    Talks on phone: Not on file    Gets together: Not on file    Attends religious service: Not on file    Active member of club or organization: Not on file    Attends meetings of clubs or organizations: Not on file    Relationship status: Not on file  Other Topics Concern  . Not on file  Social History Narrative   Daily caffeine. Works on regularly.    Family History: No family history on file. Allergies: Allergies  Allergen Reactions  . Fluoxetine Other (See Comments)    Sweating, nightmares, insomnia    Medications: See med rec.  Review of Systems: No fevers, chills, night sweats, weight loss, chest pain, or shortness of breath.   Objective:    General: Well Developed, well nourished, and in no acute distress.  Neuro: Alert and oriented x3, extra-ocular muscles intact, sensation grossly  intact.  HEENT: Normocephalic, atraumatic, pupils equal round reactive to light, neck supple, no masses, no lymphadenopathy, thyroid nonpalpable.  Skin: Warm and dry, no rashes. Cardiac: Regular rate and rhythm, no murmurs rubs or gallops, no lower extremity edema.  Respiratory: Clear to auscultation bilaterally. Not using accessory muscles, speaking in full sentences. Back Exam:  Inspection: Unremarkable  Motion: Flexion 45 deg, Extension 45 deg, Side Bending to 45 deg bilaterally,  Rotation to 45 deg bilaterally  SLR laying: Negative  XSLR laying: Negative  Palpable tenderness: None. FABER: negative. Sensory change: Gross sensation intact to all lumbar and sacral  dermatomes.  Reflexes: 2+ at both patellar tendons, 2+ at achilles tendons, Babinski's downgoing.  Strength at foot  Plantar-flexion: 5/5 Dorsi-flexion: 5/5 Eversion: 5/5 Inversion: 5/5  Leg strength  Quad: 5/5 Hamstring: 5/5 Hip flexor: 5/5 Hip abductors: 5/5  Gait unremarkable.  X-rays personally reviewed, there is some retroflexion of the coccyx, with questionable old fracture.  She does have mild lumbar degenerative disc disease, there is also a slightly hyperechoic structure around the left renal pelvis, left tell if this is truly a nephrolith.  Impression and Recommendations:    Left lumbar radiculitis Recurrence of pain, mostly axial and discogenic. She is post L4-L5 left-sided interlaminar epidural and L4-L5 transforaminal epidural with good relief back in 2014. Restarting conservative treatment, physical therapy, prednisone, ibuprofen, Flexeril at bedtime. Return to see me in 1 month, new MRI for epidural planning if not better. It sounds as though she did develop shingles after an epidural, if we do proceed with another epidural I will hit her with 7 days of prophylactic Valtrex.  Coccyx pain Possible displacement of the coccyx after an injury back in January. We will treat this conservatively for now. If insufficient relief I can do intercoccygeal disc injections.   Hematuria Occurs after runs of 5 miles or more. Unsure if this is myoglobinuria or true hematuria from a left-sided pelvic nephrolith. She is taking a great toe on aspirin as well, will discontinue this. Referral to urology. I would also like to collect a urine sample and get blood tests to see if this is myoglobinuria or from rhabdomyolysis. She will collect the urine and blood samples after her next long run. ___________________________________________ Gwen Her. Dianah Field, M.D., ABFM., CAQSM. Primary Care and [place] Instructor of [place] [facility]

## 2017-10-24 NOTE — Assessment & Plan Note (Signed)
Possible displacement of the coccyx after an injury back in January. We will treat this conservatively for now. If insufficient relief I can do intercoccygeal disc injections.

## 2017-10-24 NOTE — Assessment & Plan Note (Signed)
Occurs after runs of 5 miles or more. Unsure if this is myoglobinuria or true hematuria from a left-sided pelvic nephrolith. She is taking a great toe on aspirin as well, will discontinue this. Referral to urology. I would also like to collect a urine sample and get blood tests to see if this is myoglobinuria or from rhabdomyolysis. She will collect the urine and blood samples after her next long run.

## 2017-10-24 NOTE — Assessment & Plan Note (Addendum)
Recurrence of pain, mostly axial and discogenic. She is post L4-L5 left-sided interlaminar epidural and L4-L5 transforaminal epidural with good relief back in 2014. Restarting conservative treatment, physical therapy, prednisone, ibuprofen, Flexeril at bedtime. Return to see me in 1 month, new MRI for epidural planning if not better. It sounds as though she did develop shingles after an epidural, if we do proceed with another epidural I will hit her with 7 days of prophylactic Valtrex.

## 2017-11-15 ENCOUNTER — Other Ambulatory Visit: Payer: Self-pay

## 2017-11-15 NOTE — Telephone Encounter (Signed)
The earliest appointment with Dr Madilyn Fireman is November the 11th. She would like a refill on Adderall. She has been scheduled for the 11th.

## 2017-11-16 ENCOUNTER — Other Ambulatory Visit: Payer: Self-pay | Admitting: Family Medicine

## 2017-11-16 MED ORDER — AMPHETAMINE-DEXTROAMPHET ER 15 MG PO CP24: 15.0000 mg | ORAL_CAPSULE | ORAL | 0 refills | Status: DC

## 2017-11-17 NOTE — Telephone Encounter (Signed)
Patient advised.

## 2017-12-05 ENCOUNTER — Ambulatory Visit: Payer: 59 | Admitting: Family Medicine

## 2017-12-05 ENCOUNTER — Encounter: Payer: Self-pay | Admitting: Family Medicine

## 2017-12-05 VITALS — BP 135/89 | HR 66 | Ht 66.83 in | Wt 224.0 lb

## 2017-12-05 DIAGNOSIS — Z23 Encounter for immunization: Secondary | ICD-10-CM

## 2017-12-05 DIAGNOSIS — B001 Herpesviral vesicular dermatitis: Secondary | ICD-10-CM | POA: Diagnosis not present

## 2017-12-05 DIAGNOSIS — G8929 Other chronic pain: Secondary | ICD-10-CM

## 2017-12-05 DIAGNOSIS — F418 Other specified anxiety disorders: Secondary | ICD-10-CM

## 2017-12-05 DIAGNOSIS — F902 Attention-deficit hyperactivity disorder, combined type: Secondary | ICD-10-CM | POA: Diagnosis not present

## 2017-12-05 DIAGNOSIS — M545 Low back pain: Secondary | ICD-10-CM

## 2017-12-05 MED ORDER — VALACYCLOVIR HCL 1 G PO TABS: 2000.0000 mg | ORAL_TABLET | Freq: Two times a day (BID) | ORAL | 0 refills | Status: DC

## 2017-12-05 MED ORDER — PAROXETINE HCL 40 MG PO TABS: 60.0000 mg | ORAL_TABLET | Freq: Every day | ORAL | 1 refills | Status: DC

## 2017-12-05 MED ORDER — AMPHETAMINE-DEXTROAMPHET ER 20 MG PO CP24: 20.0000 mg | ORAL_CAPSULE | ORAL | 0 refills | Status: DC

## 2017-12-05 NOTE — Progress Notes (Signed)
   Subjective:    Patient ID: Andrea Marquez, female    DOB: 19-Mar-1970, 47 y.o.   MRN: [id number]  HPI ADD - Reports symptoms are well controlled on current regime. Denies any problems with insomnia, chest pain, palpitations, or SOB.  She said so far with the 15 mg she has not noticed a big difference in her ability to focus and concentrate.  She wants to see if she can try the next strength.  She does not have any chest pain shortness of breath or palpitations on the medication.  F/u Mood  - she is still struggling.   She is currently on Paxil 40 mg daily but noticed she is just feeling more anxious and wound up.  She is noted she starting to scratch and pick at her skin again.  She has not been to the gym recently which normally she finds very enjoyable she is had a lot of recent life stressors with her 2  children and her husband has been eating a lot of focus on taking care of them and not taking care of herself.  Also her chronic back pain is bothersome.  She is trying to do her stretches regularly.  She has a TENS unit she uses heat.  Flexeril to use when needed.  She is had a more recent outbreak with her cold sores.  She usually will use alcohol and a lysine gel which does seem to help.  Review of Systems     Objective:   Physical Exam  Constitutional: She is oriented to person, place, and time. She appears well-developed and well-nourished.  HENT:  Head: Normocephalic and atraumatic.  Eyes: Conjunctivae and EOM are normal.  Cardiovascular: Normal rate.  Pulmonary/Chest: Effort normal.  Neurological: She is alert and oriented to person, place, and time.  Skin: Skin is dry. No pallor.  Psychiatric: She has a normal mood and affect. Her behavior is normal.  Vitals reviewed.      Assessment & Plan:   ADD - Increased her Add to 20mg  daily. F/U in 1 month. WAnred about potential side effects.  Blood pressure at goal.  Depression with Anxiety -discussed options.  Will increase  Paxil to 60 mg which is the max.  I will see her back in 4 to 6 weeks and make sure  Chronic back pain-just concurrent continue with conservative therapy including stretches heat ice anti-inflammatory, muscle relaxer and TENS unit.  Right now she cannot afford a round of physical therapy.  May be in the future this could be helpful.  She has done PT in the past so she feels like she is been consistent with her exercises.  I just encouraged her to continue to stay active and exercise and if at any point her pain worsens then we can always work this up further get her in with sports medicine.  Cold sore - will send a rx for valacyclovir.

## 2018-01-21 ENCOUNTER — Other Ambulatory Visit: Payer: Self-pay | Admitting: Family Medicine

## 2018-01-24 MED ORDER — AMPHETAMINE-DEXTROAMPHET ER 20 MG PO CP24: 20.0000 mg | ORAL_CAPSULE | ORAL | 0 refills | Status: DC

## 2018-01-31 ENCOUNTER — Ambulatory Visit (INDEPENDENT_AMBULATORY_CARE_PROVIDER_SITE_OTHER): Payer: 59 | Admitting: Family Medicine

## 2018-01-31 ENCOUNTER — Encounter: Payer: Self-pay | Admitting: Family Medicine

## 2018-01-31 VITALS — BP 140/78 | HR 84 | Ht 67.0 in | Wt 235.0 lb

## 2018-01-31 DIAGNOSIS — F332 Major depressive disorder, recurrent severe without psychotic features: Secondary | ICD-10-CM

## 2018-01-31 DIAGNOSIS — F411 Generalized anxiety disorder: Secondary | ICD-10-CM | POA: Diagnosis not present

## 2018-01-31 DIAGNOSIS — R5383 Other fatigue: Secondary | ICD-10-CM | POA: Diagnosis not present

## 2018-01-31 DIAGNOSIS — F902 Attention-deficit hyperactivity disorder, combined type: Secondary | ICD-10-CM | POA: Diagnosis not present

## 2018-01-31 MED ORDER — VENLAFAXINE HCL ER 37.5 MG PO CP24: ORAL_CAPSULE | ORAL | 0 refills | Status: DC

## 2018-01-31 MED ORDER — HYDROXYZINE HCL 25 MG PO TABS: 25.0000 mg | ORAL_TABLET | Freq: Three times a day (TID) | ORAL | 3 refills | Status: DC | PRN

## 2018-01-31 MED ORDER — AMPHETAMINE-DEXTROAMPHET ER 20 MG PO CP24: 20.0000 mg | ORAL_CAPSULE | ORAL | 0 refills | Status: DC

## 2018-01-31 NOTE — Progress Notes (Signed)
Subjective:    CC: medication follow up  HPI:  ADD - Reports symptoms are well controlled on current regime. Denies any problems with insomnia, chest pain, palpitations, or SOB.    Follow-up depression/anxiety -she still really struggling with feeling very anxious and down at times.  She still reports little interest and pleasure doing things more than half the days and feeling nervous and on edge nearly every day.  She is also complaining hot flashes and night sweats.  She does have one ovary she had the other one removed.  And she is also had a hysterectomy.  She is also concerned about her weight gain.  She has been trying to exercise regularly though admits that she is really struggling with motivation but just cannot seem to move the numbers on the scale.  Past medical history, Surgical history, Family history not pertinant except as noted below, Social history, Allergies, and medications have been entered into the medical record, reviewed, and corrections made.   Review of Systems: No fevers, chills, night sweats, weight loss, chest pain, or shortness of breath.   Objective:    General: Well Developed, well nourished, and in no acute distress.  Neuro: Alert and oriented x3, extra-ocular muscles intact, sensation grossly intact.  HEENT: Normocephalic, atraumatic  Skin: Warm and dry, no rashes. Cardiac: Regular rate and rhythm, no murmurs rubs or gallops, no lower extremity edema.  Respiratory: Clear to auscultation bilaterally. Not using accessory muscles, speaking in full sentences.   Impression and Recommendations:    ADHD -stable on current regimen.  Repeat blood pressure better but borderline we will need to keep an eye on this.  If it stays elevated then we may need to stop the stimulant medication and see if this is the cause.  Depression/anxiety - PHQ-9 score of 19 and GAD-7 score of 20.  Options.  I do not think the Paxil really has been very effective.  I do think she  would also benefit from therapy and counseling.  We will wean the Paxil and start Effexor instead.  This should also hopefully help with her hot flashes.  Elevated BP -reports that she did eat something salty last night and has been a little bit swollen.  She also took 800 mg ibuprofen this morning for her back and wonders if that could be increasing her blood pressure.  Repeat was borderline so we will keep an eye on this.  Hot flashes/night sweats-discussed options.  Agreed to discontinue her Paxil which I do not think is been very effective for her mood and actually put her on Effexor which should also help with some of her hot flashes that she is been experiencing  Abnormal weight gain/BMI 36-discussed options.  Since really the Paxil has not been effective I like to wean her off of it.  Paxil can contribute to weight gain and certainly could be making it more difficult for her to lose weight.  Going to put her on Effexor instead which should be more weight neutral.  Low energy-she would like to be checked for deficiency so we will check for anemia, B12 deficiency, thyroid disorder etc.

## 2018-01-31 NOTE — Patient Instructions (Signed)
Decrease Paxil to 40 mg daily for 10 days, then decrease to half a tab daily for 10 days, then stop the medication and okay to start the Effexor the next day.  Just follow the instructions on the bottle.

## 2018-02-28 ENCOUNTER — Telehealth: Payer: Self-pay

## 2018-02-28 NOTE — Telephone Encounter (Signed)
She should have an appt coming up in about 1-2 weeks and we can discuss then. She is on XR so DO NOT take it twice a day.

## 2018-02-28 NOTE — Telephone Encounter (Signed)
Andrea Marquez states the Adderall XR is not working well. She wanted to know if she could try Adderall bid. Please advise.

## 2018-03-01 NOTE — Telephone Encounter (Signed)
Left message advising of recommendations.  

## 2018-03-02 NOTE — Telephone Encounter (Signed)
Left message advising of a return call.

## 2018-03-07 NOTE — Telephone Encounter (Signed)
Pt scheduled for 03/09/18

## 2018-03-09 ENCOUNTER — Ambulatory Visit (INDEPENDENT_AMBULATORY_CARE_PROVIDER_SITE_OTHER): Payer: 59 | Admitting: Family Medicine

## 2018-03-09 ENCOUNTER — Encounter: Payer: Self-pay | Admitting: Family Medicine

## 2018-03-09 VITALS — BP 138/82 | HR 90 | Temp 98.6°F | Ht 67.0 in | Wt 233.0 lb

## 2018-03-09 DIAGNOSIS — J019 Acute sinusitis, unspecified: Secondary | ICD-10-CM

## 2018-03-09 DIAGNOSIS — J029 Acute pharyngitis, unspecified: Secondary | ICD-10-CM | POA: Diagnosis not present

## 2018-03-09 DIAGNOSIS — F902 Attention-deficit hyperactivity disorder, combined type: Secondary | ICD-10-CM | POA: Diagnosis not present

## 2018-03-09 DIAGNOSIS — F332 Major depressive disorder, recurrent severe without psychotic features: Secondary | ICD-10-CM

## 2018-03-09 DIAGNOSIS — F411 Generalized anxiety disorder: Secondary | ICD-10-CM

## 2018-03-09 LAB — POCT RAPID STREP A (OFFICE): Rapid Strep A Screen: NEGATIVE

## 2018-03-09 MED ORDER — AMPHETAMINE-DEXTROAMPHET ER 30 MG PO CP24: 30.0000 mg | ORAL_CAPSULE | ORAL | 0 refills | Status: DC

## 2018-03-09 MED ORDER — AMOXICILLIN-POT CLAVULANATE 875-125 MG PO TABS: 1.0000 | ORAL_TABLET | Freq: Two times a day (BID) | ORAL | 0 refills | Status: DC

## 2018-03-09 NOTE — Progress Notes (Signed)
Subjective:    CC:  ADD   HPI:  ADD - Reports symptoms are well controlled on current regime. Denies any problems with insomnia, chest pain, palpitations, or SOB.  Says feels like the medicine is wearing off by mid-morning and not sure if working well right now.   Follow-up depression and anxiety-when I last saw her about 3 weeks ago she had a significant PHQ 9 score of 19 and a gad 7 score of 20.  I felt like her Paxil was quite ineffective and so switched him to Effexor.   She has also had a cold for the past few days with ST and post nasal drip and cough. No fever , chills or seats. Started on Sunday about 5 days ago.  . She has been using some chloroseptic.  + HA.  Says at first she thought it was strep throat because her throat burned most like strep throat.  Came in today because she says today is the worst she has felt since it started.  Now he she is starting to feel achy all over  Past medical history, Surgical history, Family history not pertinant except as noted below, Social history, Allergies, and medications have been entered into the medical record, reviewed, and corrections made.   Review of Systems: No fevers, chills, night sweats, weight loss, chest pain, or shortness of breath.   Objective:    General: Well Developed, well nourished, and in no acute distress.  Neuro: Alert and oriented x3, extra-ocular muscles intact, sensation grossly intact.  HEENT: Normocephalic, atraumatic, oropharynx is clear, TMs and canals are clear bilaterally.  Significant cervical lymphadenopathy. Skin: Warm and dry, no rashes. Cardiac: Regular rate and rhythm, no murmurs rubs or gallops, no lower extremity edema.  Respiratory: Clear to auscultation bilaterally. Not using accessory muscles, speaking in full sentences.   Impression and Recommendations:    ADD -gust options.  Will increase Adderall to 30 mg new prescription sent to pharmacy.  We can still consider adding a short acting in the  afternoon if needed and she will let me know in a couple weeks.  Depression anxiety -continue current dose of Effexor for right now she wants to hold off and give it a few more weeks to really give it a fair chance and then we can adjust the dose at that time.  Sinusitis-we will treat with Augmentin.  Explained that it still could be viral at this point she is really only been sick for about 5 days but she has been running a low-grade temperature 100.7 and actually feels worse today.

## 2018-04-02 ENCOUNTER — Other Ambulatory Visit: Payer: Self-pay | Admitting: Family Medicine

## 2018-04-03 MED ORDER — AMPHETAMINE-DEXTROAMPHET ER 30 MG PO CP24: 30.0000 mg | ORAL_CAPSULE | ORAL | 0 refills | Status: DC

## 2018-04-03 MED ORDER — VENLAFAXINE HCL ER 75 MG PO CP24: 75.0000 mg | ORAL_CAPSULE | Freq: Every day | ORAL | 1 refills | Status: DC

## 2018-04-20 ENCOUNTER — Other Ambulatory Visit: Payer: Self-pay

## 2018-04-20 MED ORDER — HYDROXYZINE HCL 25 MG PO TABS: 25.0000 mg | ORAL_TABLET | Freq: Three times a day (TID) | ORAL | 0 refills | Status: DC | PRN

## 2018-04-25 ENCOUNTER — Encounter: Payer: Self-pay | Admitting: Family Medicine

## 2018-05-28 ENCOUNTER — Other Ambulatory Visit: Payer: Self-pay | Admitting: Family Medicine

## 2018-05-30 ENCOUNTER — Telehealth: Payer: Self-pay | Admitting: *Deleted

## 2018-05-30 NOTE — Telephone Encounter (Signed)
LVM asking pt to rtn call to schedule a Virtual appt for f/u on her medication .Marland KitchenElouise Marquez, [place]

## 2018-06-09 ENCOUNTER — Other Ambulatory Visit: Payer: Self-pay | Admitting: Family Medicine

## 2018-06-12 ENCOUNTER — Telehealth (INDEPENDENT_AMBULATORY_CARE_PROVIDER_SITE_OTHER): Payer: 59 | Admitting: Family Medicine

## 2018-06-12 ENCOUNTER — Encounter: Payer: Self-pay | Admitting: Family Medicine

## 2018-06-12 VITALS — Temp 99.1°F | Ht 67.0 in | Wt 229.8 lb

## 2018-06-12 DIAGNOSIS — F902 Attention-deficit hyperactivity disorder, combined type: Secondary | ICD-10-CM

## 2018-06-12 DIAGNOSIS — F411 Generalized anxiety disorder: Secondary | ICD-10-CM | POA: Diagnosis not present

## 2018-06-12 DIAGNOSIS — R002 Palpitations: Secondary | ICD-10-CM | POA: Diagnosis not present

## 2018-06-12 DIAGNOSIS — F332 Major depressive disorder, recurrent severe without psychotic features: Secondary | ICD-10-CM

## 2018-06-12 MED ORDER — AMPHETAMINE-DEXTROAMPHET ER 30 MG PO CP24: 30.0000 mg | ORAL_CAPSULE | ORAL | 0 refills | Status: DC

## 2018-06-12 MED ORDER — VENLAFAXINE HCL ER 150 MG PO CP24: 150.0000 mg | ORAL_CAPSULE | Freq: Every day | ORAL | 0 refills | Status: DC

## 2018-06-12 NOTE — Progress Notes (Signed)
Pt has labs that need to be collected since January that are active.Andrea Marquez, [place]

## 2018-06-12 NOTE — Progress Notes (Signed)
Virtual Visit via Video Note  I connected with Normand Sloop Sunga on 06/12/18 at  2:20 PM EDT by a video enabled telemedicine application and verified that I am speaking with the correct person using two identifiers.   I discussed the limitations of evaluation and management by telemedicine and the availability of in person appointments. The patient expressed understanding and agreed to proceed.  Subjective:    CC: ADHD  HPI: ADHD - Reports symptoms are well controlled on current regime. Denies any problems with insomnia, chest pain, palpitations, or SOB.  She says she takes the medication first thing in the morning.  She said when she first started it she was not convinced that it really seem to be helping her but now she feels like it is she feels like she is getting things done a little bit more efficiently.  F/U MDD - she is doing OK overall. Says the effexor is helping. She is still having some heart racing at night when she is stressed. Says it lasts about 30 minutes.     GAD - she is still feeling some anxiety she does feel like the Effexor has been helpful.  She finally feels like things are leveling out a little bit.  She has been using hydroxyzine for anxiety for as needed use as well and says sometimes she actually will take it up to 3 times a day.  She just feels overly anxious and feels like she does not have time to slow down or stop when she is doing things.  She says even her children get on her sometimes and tell her to slow down and she will just say I do not have time.   She also reports some palpitations that seem to be occurring mostly at night when she is try to slow down and go to bed.  She does not feel like it is associated at all with the Adderall.  She says when it happens she just feels like she has to do some deep breathing usually last about 20 to 30 minutes and then eventually goes away on its own.  Past medical history, Surgical history, Family history not pertinant  except as noted below, Social history, Allergies, and medications have been entered into the medical record, reviewed, and corrections made.   Review of Systems: No fevers, chills, night sweats, weight loss, chest pain, or shortness of breath.   Objective:    General: Speaking clearly in complete sentences without any shortness of breath.  Alert and oriented x3.  Normal judgment. No apparent acute distress.    Impression and Recommendations:    MDD - she is doing OK but I recommend increase the effexor to 150mg .  Plan will be to follow-up in 4 weeks to make sure that she is doing well and improving.  I still think she would benefit from some therapy/counseling.  GAD - taking hydroxyzone TID some days and says wishes she could take it even more often than that at times.  We discussed that that means that her controller is not working really well and I want to increase the Effexor 250 mg and try that for a month and see if her use of the hydroxyzine decreases.  ADHD - doing well on current regimen. Will refill her medication for the next 90 days.   Palpitations - Discussed options including referral to cardiology and or heart monitor for further work-up.  She feels like it is stress/anxiety related and it very well may be.Marland Kitchen  She wants to work on mood for the next month. If not resolving then consider heart monitor for further work-up.      I discussed the assessment and treatment plan with the patient. The patient was provided an opportunity to ask questions and all were answered. The patient agreed with the plan and demonstrated an understanding of the instructions.   The patient was advised to call back or seek an in-person evaluation if the symptoms worsen or if the condition fails to improve as anticipated.   Beatrice Lecher, MD

## 2018-06-25 ENCOUNTER — Encounter: Payer: Self-pay | Admitting: Family Medicine

## 2018-06-26 NOTE — Telephone Encounter (Signed)
Patient scheduled.

## 2018-06-28 ENCOUNTER — Ambulatory Visit (INDEPENDENT_AMBULATORY_CARE_PROVIDER_SITE_OTHER): Payer: 59 | Admitting: Family Medicine

## 2018-06-28 ENCOUNTER — Encounter: Payer: Self-pay | Admitting: Family Medicine

## 2018-06-28 VITALS — BP 137/87 | HR 79 | Ht 67.0 in | Wt 227.0 lb

## 2018-06-28 DIAGNOSIS — R0789 Other chest pain: Secondary | ICD-10-CM | POA: Diagnosis not present

## 2018-06-28 DIAGNOSIS — R51 Headache: Secondary | ICD-10-CM

## 2018-06-28 DIAGNOSIS — R519 Headache, unspecified: Secondary | ICD-10-CM

## 2018-06-28 DIAGNOSIS — I10 Essential (primary) hypertension: Secondary | ICD-10-CM

## 2018-06-28 DIAGNOSIS — R002 Palpitations: Secondary | ICD-10-CM

## 2018-06-28 DIAGNOSIS — Z1231 Encounter for screening mammogram for malignant neoplasm of breast: Secondary | ICD-10-CM | POA: Diagnosis not present

## 2018-06-28 MED ORDER — HYDROCHLOROTHIAZIDE 12.5 MG PO CAPS: 12.5000 mg | ORAL_CAPSULE | Freq: Every day | ORAL | 1 refills | Status: DC

## 2018-06-28 NOTE — Assessment & Plan Note (Signed)
New diagnosis.  Based on current guidelines systolic blood pressure greater than 130 is consistent with hypertension and recommended treatment.  I still wonder if the Adderall could be contributing.  Though she says this really started while she was actually off her Adderall for over a week.  But certainly it could be aggravating her symptoms.  We will start HCTZ 12.5 mg which should help with some of the swelling that she has been experiencing as well as lower her blood pressure.  But have her check her pressure at home once or twice daily and then bring those numbers in when she comes in in 2 weeks so that we can make adjustments at that time.  I suspect that this is present probably more of a gradual onset though she has had significant spikes over the last couple of weeks.  We will do additional work-up just to rule out thyroid disorder, anemia etc.

## 2018-06-28 NOTE — Patient Instructions (Signed)
DASH Eating Plan  DASH stands for "Dietary Approaches to Stop Hypertension." The DASH eating plan is a healthy eating plan that has been shown to reduce high blood pressure (hypertension). It may also reduce your risk for type 2 diabetes, heart disease, and stroke. The DASH eating plan may also help with weight loss.  What are tips for following this plan?    General guidelines   Avoid eating more than 2,300 mg (milligrams) of salt (sodium) a day. If you have hypertension, you may need to reduce your sodium intake to 1,500 mg a day.   Limit alcohol intake to no more than 1 drink a day for nonpregnant women and 2 drinks a day for men. One drink equals 12 oz of beer, 5 oz of wine, or 1 oz of hard liquor.   Work with your health care provider to maintain a healthy body weight or to lose weight. Ask what an ideal weight is for you.   Get at least 30 minutes of exercise that causes your heart to beat faster (aerobic exercise) most days of the week. Activities may include walking, swimming, or biking.   Work with your health care provider or diet and nutrition specialist (dietitian) to adjust your eating plan to your individual calorie needs.  Reading food labels     Check food labels for the amount of sodium per serving. Choose foods with less than 5 percent of the Daily Value of sodium. Generally, foods with less than 300 mg of sodium per serving fit into this eating plan.   To find whole grains, look for the word "whole" as the first word in the ingredient list.  Shopping   Buy products labeled as "low-sodium" or "no salt added."   Buy fresh foods. Avoid canned foods and premade or frozen meals.  Cooking   Avoid adding salt when cooking. Use salt-free seasonings or herbs instead of table salt or sea salt. Check with your health care provider or pharmacist before using salt substitutes.   Do not fry foods. Cook foods using healthy methods such as baking, boiling, grilling, and broiling instead.   Cook with  heart-healthy oils, such as olive, canola, soybean, or sunflower oil.  Meal planning   Eat a balanced diet that includes:  ? 5 or more servings of fruits and vegetables each day. At each meal, try to fill half of your plate with fruits and vegetables.  ? Up to 6-8 servings of whole grains each day.  ? Less than 6 oz of lean meat, poultry, or fish each day. A 3-oz serving of meat is about the same size as a deck of cards. One egg equals 1 oz.  ? 2 servings of low-fat dairy each day.  ? A serving of nuts, seeds, or beans 5 times each week.  ? Heart-healthy fats. Healthy fats called Omega-3 fatty acids are found in foods such as flaxseeds and coldwater fish, like sardines, salmon, and mackerel.   Limit how much you eat of the following:  ? Canned or prepackaged foods.  ? Food that is high in trans fat, such as fried foods.  ? Food that is high in saturated fat, such as fatty meat.  ? Sweets, desserts, sugary drinks, and other foods with added sugar.  ? Full-fat dairy products.   Do not salt foods before eating.   Try to eat at least 2 vegetarian meals each week.   Eat more home-cooked food and less restaurant, buffet, and fast food.     When eating at a restaurant, ask that your food be prepared with less salt or no salt, if possible.  What foods are recommended?  The items listed may not be a complete list. Talk with your dietitian about what dietary choices are best for you.  Grains  Whole-grain or whole-wheat bread. Whole-grain or whole-wheat pasta. Brown rice. Oatmeal. Quinoa. Bulgur. Whole-grain and low-sodium cereals. Pita bread. Low-fat, low-sodium crackers. Whole-wheat flour tortillas.  Vegetables  Fresh or frozen vegetables (raw, steamed, roasted, or grilled). Low-sodium or reduced-sodium tomato and vegetable juice. Low-sodium or reduced-sodium tomato sauce and tomato paste. Low-sodium or reduced-sodium canned vegetables.  Fruits  All fresh, dried, or frozen fruit. Canned fruit in natural juice (without  added sugar).  Meat and other protein foods  Skinless chicken or turkey. Ground chicken or turkey. Pork with fat trimmed off. Fish and seafood. Egg whites. Dried beans, peas, or lentils. Unsalted nuts, nut butters, and seeds. Unsalted canned beans. Lean cuts of beef with fat trimmed off. Low-sodium, lean deli meat.  Dairy  Low-fat (1%) or fat-free (skim) milk. Fat-free, low-fat, or reduced-fat cheeses. Nonfat, low-sodium ricotta or cottage cheese. Low-fat or nonfat yogurt. Low-fat, low-sodium cheese.  Fats and oils  Soft margarine without trans fats. Vegetable oil. Low-fat, reduced-fat, or light mayonnaise and salad dressings (reduced-sodium). Canola, safflower, olive, soybean, and sunflower oils. Avocado.  Seasoning and other foods  Herbs. Spices. Seasoning mixes without salt. Unsalted popcorn and pretzels. Fat-free sweets.  What foods are not recommended?  The items listed may not be a complete list. Talk with your dietitian about what dietary choices are best for you.  Grains  Baked goods made with fat, such as croissants, muffins, or some breads. Dry pasta or rice meal packs.  Vegetables  Creamed or fried vegetables. Vegetables in a cheese sauce. Regular canned vegetables (not low-sodium or reduced-sodium). Regular canned tomato sauce and paste (not low-sodium or reduced-sodium). Regular tomato and vegetable juice (not low-sodium or reduced-sodium). Pickles. Olives.  Fruits  Canned fruit in a light or heavy syrup. Fried fruit. Fruit in cream or butter sauce.  Meat and other protein foods  Fatty cuts of meat. Ribs. Fried meat. Bacon. Sausage. Bologna and other processed lunch meats. Salami. Fatback. Hotdogs. Bratwurst. Salted nuts and seeds. Canned beans with added salt. Canned or smoked fish. Whole eggs or egg yolks. Chicken or turkey with skin.  Dairy  Whole or 2% milk, cream, and half-and-half. Whole or full-fat cream cheese. Whole-fat or sweetened yogurt. Full-fat cheese. Nondairy creamers. Whipped toppings.  Processed cheese and cheese spreads.  Fats and oils  Butter. Stick margarine. Lard. Shortening. Ghee. Bacon fat. Tropical oils, such as coconut, palm kernel, or palm oil.  Seasoning and other foods  Salted popcorn and pretzels. Onion salt, garlic salt, seasoned salt, table salt, and sea salt. Worcestershire sauce. Tartar sauce. Barbecue sauce. Teriyaki sauce. Soy sauce, including reduced-sodium. Steak sauce. Canned and packaged gravies. Fish sauce. Oyster sauce. Cocktail sauce. Horseradish that you find on the shelf. Ketchup. Mustard. Meat flavorings and tenderizers. Bouillon cubes. Hot sauce and Tabasco sauce. Premade or packaged marinades. Premade or packaged taco seasonings. Relishes. Regular salad dressings.  Where to find more information:   National Heart, Lung, and Blood Institute: www.nhlbi.nih.gov   American Heart Association: www.heart.org  Summary   The DASH eating plan is a healthy eating plan that has been shown to reduce high blood pressure (hypertension). It may also reduce your risk for type 2 diabetes, heart disease, and stroke.   With the   DASH eating plan, you should limit salt (sodium) intake to 2,300 mg a day. If you have hypertension, you may need to reduce your sodium intake to 1,500 mg a day.   When on the DASH eating plan, aim to eat more fresh fruits and vegetables, whole grains, lean proteins, low-fat dairy, and heart-healthy fats.   Work with your health care provider or diet and nutrition specialist (dietitian) to adjust your eating plan to your individual calorie needs.  This information is not intended to replace advice given to you by your health care provider. Make sure you discuss any questions you have with your health care provider.  Document Released: 12/31/2010 Document Revised: 01/05/2016 Document Reviewed: 01/05/2016  [employer]vier Interactive Patient Education  2019 [employer]

## 2018-06-28 NOTE — Progress Notes (Signed)
She reports that last Thursday her hands,ankles and feet began to swell and headaches, and some chest discomfort.  Maryruth Eve, [place] Crocker, CMA

## 2018-06-28 NOTE — Progress Notes (Addendum)
Acute Office Visit  Subjective:    Patient ID: Andrea Marquez, female    DOB: 17-Sep-1970, 48 y.o.   MRN: [id number]  Chief Complaint  Patient presents with  . Hypertension  . Edema  . Headache    HPI Patient is in today for elevated blood pressure and swelling.  She says around last Thursday she was actually going to the pharmacy to pick up her prescription, particularly her Adderall which she had actually been off of for about a week and had noticed that she been having some swelling in her ankles and then in her hands.  She had been doing a lot of yard work and thought maybe that was part of it.  She is also been having headache that day which she thought might be from allergies.  She says she spoke to the pharmacist about the swelling to see if they would recommend something.  They asked if she had been monitoring her blood pressure.  She said she went home that day and checked her pressure and has been checking it since.  Her blood pressures have ranged from the 130s up to 190.  She is continued to have a headache on and off since then.  And has continued to notice some swelling in her hands and feet.  She does still notice some occasional chest flutters though this is not new.  She denies any significant chest pain though occasionally will notice a little pressure.  no shortness of breath.  She has been exercising regularly.  She does that she did cut back on her caffeine to just 1 cup of coffee in the morning.  But she does take Adderall daily.  Though she was actually off of her Adderall when this first started.  She says she actually eats a pretty low-salt diet and is noticed lately that she is much more sensitive to the taste of salt.  Reports family history of atrial fibrillation and COPD in her father.  Has noticed that she has been flushing more easily.  She also noticed that on her Fitbit that sometimes she will skip some heartbeats.  She is not sure if it is movement of the band or  if it may be something significant.    History reviewed. No pertinent past medical history.  Past Surgical History:  Procedure Laterality Date  . BREAST ENHANCEMENT SURGERY    . OOPHORECTOMY     Single   . VAGINAL HYSTERECTOMY      Family History  Problem Relation Age of Onset  . COPD Father   . Atrial fibrillation Father     Social History   Socioeconomic History  . Marital status: Married    Spouse name: Randall Hiss  . Number of children: 2  . Years of education: Not on file  . Highest education level: Not on file  Occupational History  . Not on file  Social Needs  . Financial resource strain: Not on file  . Food insecurity:    Worry: Not on file    Inability: Not on file  . Transportation needs:    Medical: Not on file    Non-medical: Not on file  Tobacco Use  . Smoking status: Never Smoker  . Smokeless tobacco: Never Used  Substance and Sexual Activity  . Alcohol use: Not on file  . Drug use: No  . Sexual activity: Never  Lifestyle  . Physical activity:    Days per week: Not on file    Minutes  per session: Not on file  . Stress: Not on file  Relationships  . Social connections:    Talks on phone: Not on file    Gets together: Not on file    Attends religious service: Not on file    Active member of club or organization: Not on file    Attends meetings of clubs or organizations: Not on file    Relationship status: Not on file  . Intimate partner violence:    Fear of current or ex partner: Not on file    Emotionally abused: Not on file    Physically abused: Not on file    Forced sexual activity: Not on file  Other Topics Concern  . Not on file  Social History Narrative   Daily caffeine. Works on regularly.     Outpatient Medications Prior to Visit  Medication Sig Dispense Refill  . [START ON 08/10/2018] amphetamine-dextroamphetamine (ADDERALL XR) 30 MG 24 hr capsule Take 1 capsule (30 mg total) by mouth every morning. 30 capsule 0  . [START ON  07/12/2018] amphetamine-dextroamphetamine (ADDERALL XR) 30 MG 24 hr capsule Take 1 capsule (30 mg total) by mouth every morning. 30 capsule 0  . amphetamine-dextroamphetamine (ADDERALL XR) 30 MG 24 hr capsule Take 1 capsule (30 mg total) by mouth every morning. 30 capsule 0  . hydrOXYzine (ATARAX/VISTARIL) 25 MG tablet TAKE 1 TABLET (25 MG TOTAL) BY MOUTH 3 (THREE) TIMES DAILY AS NEEDED FOR ANXIETY. 60 tablet 0  . valACYclovir (VALTREX) 1000 MG tablet Take 2 tablets (2,000 mg total) by mouth 2 (two) times daily. X 1 day. ( 4 tabs per episdoe) 20 tablet 0  . venlafaxine XR (EFFEXOR-XR) 150 MG 24 hr capsule Take 1 capsule (150 mg total) by mouth daily with breakfast. 90 capsule 0   No facility-administered medications prior to visit.     Allergies  Allergen Reactions  . Fluoxetine Other (See Comments)    Sweating, nightmares, insomnia     ROS     Objective:    Physical Exam  Constitutional: She is oriented to person, place, and time. She appears well-developed and well-nourished.  HENT:  Head: Normocephalic and atraumatic.  Right Ear: External ear normal.  Left Ear: External ear normal.  Eyes: Conjunctivae are normal.  Neck: Neck supple. No thyromegaly present.  Cardiovascular: Normal rate, regular rhythm and normal heart sounds.  Pulmonary/Chest: Effort normal and breath sounds normal.  Musculoskeletal:        General: Edema present.     Comments: Trace edema in the hands and ankles.    Lymphadenopathy:    She has no cervical adenopathy.  Neurological: She is alert and oriented to person, place, and time. No cranial nerve deficit.  Skin: Skin is warm and dry.  Psychiatric: She has a normal mood and affect. Her behavior is normal.    BP 137/87   Pulse 79   Ht 5\' 7"  (1.702 m)   Wt 227 lb (103 kg)   SpO2 98%   BMI 35.55 kg/m  Wt Readings from Last 3 Encounters:  06/28/18 227 lb (103 kg)  06/12/18 229 lb 12.8 oz (104.2 kg)  03/09/18 233 lb (105.7 kg)    Health  Maintenance Due  Topic Date Due  . MAMMOGRAM  06/06/2018  . COLONOSCOPY  06/06/2018    There are no preventive care reminders to display for this patient.   Lab Results  Component Value Date   TSH 3.58 04/07/2015   Lab Results  Component Value  Date   WBC 5.3 05/23/2017   HGB 14.3 05/23/2017   HCT 41.4 05/23/2017   MCV 93.0 05/23/2017   PLT 260 05/23/2017   Lab Results  Component Value Date   NA 140 05/23/2017   K 4.6 05/23/2017   CO2 30 05/23/2017   GLUCOSE 99 05/23/2017   BUN 12 05/23/2017   CREATININE 0.65 05/23/2017   BILITOT 0.5 05/23/2017   ALKPHOS 49 08/21/2015   AST 13 05/23/2017   ALT 13 05/23/2017   PROT 6.9 05/23/2017   ALBUMIN 4.6 08/21/2015   CALCIUM 9.2 05/23/2017   Lab Results  Component Value Date   CHOL 205 (H) 05/23/2017   Lab Results  Component Value Date   HDL 62 05/23/2017   Lab Results  Component Value Date   LDLCALC 125 (H) 05/23/2017   Lab Results  Component Value Date   TRIG 82 05/23/2017   Lab Results  Component Value Date   CHOLHDL 3.3 05/23/2017   No results found for: HGBA1C     Assessment & Plan:   Problem List Items Addressed This Visit      Cardiovascular and Mediastinum   Hypertension    New diagnosis.  Based on current guidelines systolic blood pressure greater than 130 is consistent with hypertension and recommended treatment.  I still wonder if the Adderall could be contributing.  Though she says this really started while she was actually off her Adderall for over a week.  But certainly it could be aggravating her symptoms.  We will start HCTZ 12.5 mg which should help with some of the swelling that she has been experiencing as well as lower her blood pressure.  But have her check her pressure at home once or twice daily and then bring those numbers in when she comes in in 2 weeks so that we can make adjustments at that time.  I suspect that this is present probably more of a gradual onset though she has had  significant spikes over the last couple of weeks.  We will do additional work-up just to rule out thyroid disorder, anemia etc.      Relevant Medications   hydrochlorothiazide (MICROZIDE) 12.5 MG capsule   Other Relevant Orders   CBC   COMPLETE METABOLIC PANEL WITH GFR   Lipid panel   TSH   CK (Creatine Kinase)   Urine Microalbumin w/creat. ratio    Other Visit Diagnoses    Palpitations    -  Primary   Relevant Orders   EKG 12-Lead   CBC   COMPLETE METABOLIC PANEL WITH GFR   Lipid panel   TSH   CK (Creatine Kinase)   Urine Microalbumin w/creat. ratio   Atypical chest pain       Relevant Orders   CBC   COMPLETE METABOLIC PANEL WITH GFR   Lipid panel   TSH   CK (Creatine Kinase)   Urine Microalbumin w/creat. ratio   Screening mammogram, encounter for       Relevant Orders   MM 3D SCREEN BREAST BILATERAL   Nonintractable episodic headache, unspecified headache type         Palpitations- EKG today shows rate of 79 bpm, normal sinus rhythm with no acute ST-T wave changes.  No concerning findings.  Consider further work-up with heart monitor and possible echocardiogram.  Atypical chest pain-it does not seem to be necessarily related to when her blood pressure is elevated and comes and goes.  It does not sound too concerning for cardiac  disease.  Headache-suspect it is directly related to her elevated blood pressures.  We will see if this improves over the next week or 2 as she starts the HCTZ.  It sounds like she really does a good job and eating a low-salt diet but additional information provided about the DASH diet.  Continue to minimize caffeine use.  Meds ordered this encounter  Medications  . hydrochlorothiazide (MICROZIDE) 12.5 MG capsule    Sig: Take 1 capsule (12.5 mg total) by mouth daily.    Dispense:  30 capsule    Refill:  1     Beatrice Lecher, MD

## 2018-06-29 LAB — CBC
HCT: 43.5 % (ref 35.0–45.0)
Hemoglobin: 14.7 g/dL (ref 11.7–15.5)
MCH: 31.7 pg (ref 27.0–33.0)
MCHC: 33.8 g/dL (ref 32.0–36.0)
MCV: 93.8 fL (ref 80.0–100.0)
MPV: 11.5 fL (ref 7.5–12.5)
Platelets: 286 10*3/uL (ref 140–400)
RBC: 4.64 10*6/uL (ref 3.80–5.10)
RDW: 13 % (ref 11.0–15.0)
WBC: 5.5 10*3/uL (ref 3.8–10.8)

## 2018-06-29 LAB — MICROALBUMIN / CREATININE URINE RATIO
Creatinine, Urine: 189 mg/dL (ref 20–275)
Microalb Creat Ratio: 7 mcg/mg creat (ref <30)
Microalb, Ur: 1.4 mg/dL

## 2018-06-29 LAB — LIPID PANEL
Cholesterol: 223 mg/dL — ABNORMAL HIGH (ref <200)
HDL: 62 mg/dL (ref >=50)
LDL Cholesterol (Calc): 145 mg/dL (calc) — ABNORMAL HIGH
Non-HDL Cholesterol (Calc): 161 mg/dL (calc) — ABNORMAL HIGH (ref <130)
Total CHOL/HDL Ratio: 3.6 (calc) (ref <5.0)
Triglycerides: 68 mg/dL (ref <150)

## 2018-06-29 LAB — CK: Total CK: 111 U/L (ref 29–143)

## 2018-06-29 LAB — COMPLETE METABOLIC PANEL WITH GFR
AG Ratio: 1.8 (calc) (ref 1.0–2.5)
ALT: 35 U/L — ABNORMAL HIGH (ref 6–29)
AST: 24 U/L (ref 10–35)
Albumin: 4.8 g/dL (ref 3.6–5.1)
Alkaline phosphatase (APISO): 68 U/L (ref 37–153)
BUN: 21 mg/dL (ref 7–25)
CO2: 26 mmol/L (ref 20–32)
Calcium: 10 mg/dL (ref 8.6–10.4)
Chloride: 105 mmol/L (ref 98–110)
Creat: 0.75 mg/dL (ref 0.50–1.05)
GFR, Est African American: 108 mL/min/{1.73_m2} (ref >=60)
GFR, Est Non African American: 93 mL/min/{1.73_m2} (ref >=60)
Globulin: 2.7 g/dL (calc) (ref 1.9–3.7)
Glucose, Bld: 102 mg/dL — ABNORMAL HIGH (ref 65–99)
Potassium: 4.4 mmol/L (ref 3.5–5.3)
Sodium: 141 mmol/L (ref 135–146)
Total Bilirubin: 0.5 mg/dL (ref 0.2–1.2)
Total Protein: 7.5 g/dL (ref 6.1–8.1)

## 2018-06-29 LAB — TSH: TSH: 2.06 mIU/L

## 2018-07-01 ENCOUNTER — Encounter: Payer: Self-pay | Admitting: Family Medicine

## 2018-07-01 DIAGNOSIS — R0789 Other chest pain: Secondary | ICD-10-CM

## 2018-07-01 DIAGNOSIS — I10 Essential (primary) hypertension: Secondary | ICD-10-CM

## 2018-07-01 DIAGNOSIS — R002 Palpitations: Secondary | ICD-10-CM

## 2018-07-06 ENCOUNTER — Other Ambulatory Visit: Payer: Self-pay

## 2018-07-06 ENCOUNTER — Ambulatory Visit (INDEPENDENT_AMBULATORY_CARE_PROVIDER_SITE_OTHER): Payer: 59

## 2018-07-06 DIAGNOSIS — Z1231 Encounter for screening mammogram for malignant neoplasm of breast: Secondary | ICD-10-CM

## 2018-07-12 ENCOUNTER — Encounter: Payer: Self-pay | Admitting: Family Medicine

## 2018-07-12 DIAGNOSIS — Z1211 Encounter for screening for malignant neoplasm of colon: Secondary | ICD-10-CM

## 2018-07-13 ENCOUNTER — Encounter: Payer: Self-pay | Admitting: Gastroenterology

## 2018-07-13 ENCOUNTER — Other Ambulatory Visit: Payer: Self-pay | Admitting: Family Medicine

## 2018-07-13 ENCOUNTER — Other Ambulatory Visit: Payer: Self-pay | Admitting: *Deleted

## 2018-07-13 DIAGNOSIS — R002 Palpitations: Secondary | ICD-10-CM

## 2018-07-13 DIAGNOSIS — I10 Essential (primary) hypertension: Secondary | ICD-10-CM

## 2018-07-13 DIAGNOSIS — R0789 Other chest pain: Secondary | ICD-10-CM

## 2018-07-13 DIAGNOSIS — R928 Other abnormal and inconclusive findings on diagnostic imaging of breast: Secondary | ICD-10-CM

## 2018-07-13 NOTE — Telephone Encounter (Signed)
Please check with Jenny Reichmann on echocardiogram.  The order has been placed.  I did go ahead and place the GI referral for screening colonoscopy today.  So hopefully she should hear from them sometime next week.

## 2018-07-18 ENCOUNTER — Telehealth: Payer: Self-pay | Admitting: *Deleted

## 2018-07-18 ENCOUNTER — Telehealth (INDEPENDENT_AMBULATORY_CARE_PROVIDER_SITE_OTHER): Payer: 59 | Admitting: Family Medicine

## 2018-07-18 ENCOUNTER — Encounter: Payer: Self-pay | Admitting: Family Medicine

## 2018-07-18 VITALS — BP 152/96 | HR 68 | Temp 97.6°F | Ht 67.0 in | Wt 223.0 lb

## 2018-07-18 DIAGNOSIS — N393 Stress incontinence (female) (male): Secondary | ICD-10-CM | POA: Insufficient documentation

## 2018-07-18 DIAGNOSIS — R61 Generalized hyperhidrosis: Secondary | ICD-10-CM | POA: Insufficient documentation

## 2018-07-18 DIAGNOSIS — I10 Essential (primary) hypertension: Secondary | ICD-10-CM | POA: Diagnosis not present

## 2018-07-18 DIAGNOSIS — R635 Abnormal weight gain: Secondary | ICD-10-CM | POA: Diagnosis not present

## 2018-07-18 DIAGNOSIS — R350 Frequency of micturition: Secondary | ICD-10-CM

## 2018-07-18 MED ORDER — LISINOPRIL-HYDROCHLOROTHIAZIDE 20-25 MG PO TABS: 1.0000 | ORAL_TABLET | Freq: Every day | ORAL | 3 refills | Status: DC

## 2018-07-18 MED ORDER — HYDROXYZINE HCL 25 MG PO TABS: 25.0000 mg | ORAL_TABLET | Freq: Three times a day (TID) | ORAL | 2 refills | Status: DC | PRN

## 2018-07-18 NOTE — Telephone Encounter (Signed)
Left message for patient to return call to discuss arrangement of 14 day live ZIO monitor that will be mailed to her. Will verify address and insurance information when patient calls back.

## 2018-07-18 NOTE — Progress Notes (Signed)
Virtual Visit via Video Note  I connected with Andrea Marquez on 07/18/18 at  9:50 AM EDT by a video enabled telemedicine application and verified that I am speaking with the correct person using two identifiers.   I discussed the limitations of evaluation and management by telemedicine and the availability of in person appointments. The patient expressed understanding and agreed to proceed.  Pt was at home and I was in my office for the virtual visit.     Subjective:    CC: F/U elevated BP  HPI: F/U HTN - Home BPs running in the 140-150s.  She did start the hydrochlorothiazide 12.5 mg and says she did notice that it seemed to improve some of the swelling in her hands and feet.  She does not feel like it is caused any increased urination but she feels like she urinates all the time anyway.  Has always had really sweaty feet and hands and then will get cold. She has gained weight but is very active.     Will occ leak urine when she tries to jump rope.  She also urinates very frequently and that has been going on for years.  Says her anxiety is high right now.  She says that her children joke that her blood pressure always seems to be a little bit higher when her husband is her home.  Still taking the Effexor and feels like it has been helpful.  She does feel that some of her focus has shifted in a positive way more recently.  She is really working on losing weight and is down about 4 lbs. she has really tried hard to make some changes and is actually been doing really well.  She says just not coming off as fast as she would like it to.   Past medical history, Surgical history, Family history not pertinant except as noted below, Social history, Allergies, and medications have been entered into the medical record, reviewed, and corrections made.   Review of Systems: No fevers, chills, night sweats, weight loss, chest pain, or shortness of breath.   Objective:    General: Speaking clearly  in complete sentences without any shortness of breath.  Alert and oriented x3.  Normal judgment. No apparent acute distress. Well groomed.     Impression and Recommendations:    Assessment and plan:  HTN -  BPs running in the 140s-150s.  Discussed options.  Will increase the hydrochlorothiazide as well as add lisinopril.  She said she like to try increasing the diuretic dose to see if it continues to improve the swelling that she occasionally notices. F/U  3 to 4 weeks.  Abnormal weight gain - she has lost about 5 lbs.  She is doing great.  Kercher to keep up the great work.  Of hoping that this will improve her blood pressure as well.  I love to see her weight get under 200.  Sweaty hands/feet- discussed treatment options occluding topical aluminum chloride versus Botox injections.  Right now she says she is fine and does not want to be aggressive in any type of treatment.  Frequent urination/stress incontinence -gust possible referral to urology for the symptoms.  She wants to hold off for now.  Mood -  continue with Effexor for now.   I discussed the assessment and treatment plan with the patient. The patient was provided an opportunity to ask questions and all were answered. The patient agreed with the plan and demonstrated an understanding of the  instructions.   The patient was advised to call back or seek an in-person evaluation if the symptoms worsen or if the condition fails to improve as anticipated.   Beatrice Lecher, MD

## 2018-07-18 NOTE — Progress Notes (Signed)
She hasn't heard about the Heart monitor or the echo.  She took her bp AGAIN @ 909 AM: 162/103 p:68  Pt wants to understand why her BP readings are all over the place.  Pt will send over her previous BP readings after we hang up.Andrea Marquez, [place]

## 2018-07-19 NOTE — Telephone Encounter (Signed)
Patient returned call. Explained that the monitor will be mailed to her with instructions as to how to apply it. Insurance information and address verified with patient. Informed patient that [employer] will call her once she receives her monitor so make sure to answer the call. Patient is agreeable and verbalized understanding. Monitor has been registered on the [employer]. No further questions.

## 2018-07-25 ENCOUNTER — Other Ambulatory Visit (INDEPENDENT_AMBULATORY_CARE_PROVIDER_SITE_OTHER): Payer: 59

## 2018-07-25 DIAGNOSIS — I10 Essential (primary) hypertension: Secondary | ICD-10-CM | POA: Diagnosis not present

## 2018-07-25 DIAGNOSIS — R002 Palpitations: Secondary | ICD-10-CM | POA: Diagnosis not present

## 2018-07-25 DIAGNOSIS — R0789 Other chest pain: Secondary | ICD-10-CM | POA: Diagnosis not present

## 2018-07-26 NOTE — Telephone Encounter (Signed)
Colonoscopy complete will check on Echo order. - CF

## 2018-07-28 ENCOUNTER — Other Ambulatory Visit: Payer: Self-pay | Admitting: Family Medicine

## 2018-08-09 ENCOUNTER — Ambulatory Visit (HOSPITAL_BASED_OUTPATIENT_CLINIC_OR_DEPARTMENT_OTHER)
Admission: RE | Admit: 2018-08-09 | Discharge: 2018-08-09 | Disposition: A | Discharge status: Home / Self Care | Payer: 59 | Source: Ambulatory Visit | Attending: Family Medicine | Admitting: Family Medicine

## 2018-08-09 ENCOUNTER — Other Ambulatory Visit: Payer: Self-pay

## 2018-08-09 DIAGNOSIS — R002 Palpitations: Secondary | ICD-10-CM

## 2018-08-09 DIAGNOSIS — R0789 Other chest pain: Secondary | ICD-10-CM

## 2018-08-09 DIAGNOSIS — I10 Essential (primary) hypertension: Secondary | ICD-10-CM | POA: Diagnosis present

## 2018-08-09 NOTE — Progress Notes (Signed)
  Echocardiogram 2D Echocardiogram has been performed.  Andrea Marquez 08/09/2018, 11:29 AM

## 2018-08-10 ENCOUNTER — Other Ambulatory Visit: Payer: Self-pay

## 2018-08-10 ENCOUNTER — Ambulatory Visit: Payer: 59 | Admitting: *Deleted

## 2018-08-10 VITALS — Ht 68.0 in | Wt 221.0 lb

## 2018-08-10 DIAGNOSIS — Z1211 Encounter for screening for malignant neoplasm of colon: Secondary | ICD-10-CM

## 2018-08-10 MED ORDER — PLENVU 140 G PO SOLR: 1.0000 | ORAL | 0 refills | Status: DC

## 2018-08-10 NOTE — Progress Notes (Signed)
No egg or soy allergy known to patient  No issues with past sedation with any surgeries  or procedures, no intubation problems  No diet pills per patient No home 02 use per patient  No blood thinners per patient  Pt denies issues with constipation  No A fib or A flutter  EMMI video sent to pt's e mail  Pt states no new card- the scanned in 11-2017 card is the newest card for her  Echo yesterday EF 60-65%- normal- no concerns or issues    Pt verified name, DOB, address and insurance during PV today. Pt mailed instruction packet to included paper to complete and mail back to [facility] with addressed and stamped envelope, Emmi video, copy of consent form to read and not return, and instructions. [employer]  coupon mailed in packet. PV completed over the phone. Pt encouraged to call with questions or issues   Pt is aware that care partner will wait in the car during procedure; if they feel like they will be too hot to wait in the car; they may wait in the lobby.  We want them to wear a mask (we do not have any that we can provide them), practice social distancing, and we will check their temperatures when they get here.  I did remind patient that their care partner needs to stay in the parking lot the entire time. Pt will wear mask into building.

## 2018-08-22 ENCOUNTER — Encounter: Payer: Self-pay | Admitting: Family Medicine

## 2018-08-22 NOTE — Telephone Encounter (Signed)
There should actually already be a refill sitting at her pharmacy for Adderall for July she just needs to call them and make sure that they have filled it and it is ready for her to pick up.  I had sent 3 prescriptions in May.  One for May June and July.  It may just be that she has not picked up the July prescription yet.  Also okay to change her visit from virtual to in person.

## 2018-08-23 ENCOUNTER — Telehealth: Payer: Self-pay | Admitting: Gastroenterology

## 2018-08-23 ENCOUNTER — Ambulatory Visit (INDEPENDENT_AMBULATORY_CARE_PROVIDER_SITE_OTHER): Payer: 59 | Admitting: Family Medicine

## 2018-08-23 ENCOUNTER — Other Ambulatory Visit: Payer: Self-pay

## 2018-08-23 ENCOUNTER — Encounter: Payer: Self-pay | Admitting: Family Medicine

## 2018-08-23 VITALS — BP 119/85 | HR 75 | Ht 68.0 in | Wt 231.0 lb

## 2018-08-23 DIAGNOSIS — F902 Attention-deficit hyperactivity disorder, combined type: Secondary | ICD-10-CM | POA: Diagnosis not present

## 2018-08-23 DIAGNOSIS — R635 Abnormal weight gain: Secondary | ICD-10-CM | POA: Diagnosis not present

## 2018-08-23 DIAGNOSIS — I10 Essential (primary) hypertension: Secondary | ICD-10-CM | POA: Diagnosis not present

## 2018-08-23 DIAGNOSIS — I471 Supraventricular tachycardia: Secondary | ICD-10-CM

## 2018-08-23 DIAGNOSIS — I4719 Other supraventricular tachycardia: Secondary | ICD-10-CM | POA: Insufficient documentation

## 2018-08-23 MED ORDER — AMPHETAMINE-DEXTROAMPHET ER 30 MG PO CP24: 30.0000 mg | ORAL_CAPSULE | ORAL | 0 refills | Status: DC

## 2018-08-23 MED ORDER — METOPROLOL SUCCINATE ER 25 MG PO TB24: 25.0000 mg | ORAL_TABLET | Freq: Every day | ORAL | 2 refills | Status: DC

## 2018-08-23 NOTE — Telephone Encounter (Signed)

## 2018-08-23 NOTE — Assessment & Plan Note (Signed)
Discussed options.  One we can just leave it be.  But to we can consider starting a beta-blocker which would help lower her pulse and decrease her max heart rate while exercising and it might make her more comfortable than feeling like she is having these racing episodes with her heart.  I think it is worth a try so she is can split her lisinopril HCTZ in half and start just a low dose 25 mg extended release metoprolol.  Would like her to track her blood pressures at home over the next couple weeks.  Plan for follow-up in 1 month.

## 2018-08-23 NOTE — Progress Notes (Signed)
Established Patient Office Visit  Subjective:  Patient ID: Andrea Marquez, female    DOB: 1970/02/09  Age: 48 y.o. MRN: [id number]  CC:  Chief Complaint  Patient presents with  . Follow-up    HPI SPARKLES MCNEELY presents for   ADHD - Reports symptoms are well controlled on current regime. Denies any problems with insomnia, chest pain, or SOB.  She has been off her medicaitons for the heart monitor test.   She also wanted to go over her heart monitor and echocardiogram results.  She had come in complaining about palpitations.  She also noticed that with working out she would hit her really her heart rate sometimes around 200 and she could feel it pounding in her chest.  And occasionally she would just have episodes even at rest where she would feel her heart race.  She is also been really frustrated with her weight.  She was doing well initially but then over the last couple of weeks while she was doing a heart monitor she was afraid to exercise because she tends to sweat a lot.  She says even without exercising the monitor fell off after 3 days.  She also admits that she was stress eating during that time and was not sticking strictly to her diet.  So unfortunately based on our scale she is actually gained back about 10 pounds.  Past Medical History:  Diagnosis Date  . ADHD   . Anxiety   . Depression   . GERD (gastroesophageal reflux disease)   . Hypertension   . PONV (postoperative nausea and vomiting)     Past Surgical History:  Procedure Laterality Date  . AUGMENTATION MAMMAPLASTY    . BREAST ENHANCEMENT SURGERY    . LAPAROSCOPY    . NSVD     x2  . OOPHORECTOMY     Single   . TUBAL LIGATION    . VAGINAL HYSTERECTOMY      Family History  Problem Relation Age of Onset  . COPD Father   . Atrial fibrillation Father   . Esophageal cancer Maternal Grandfather   . Colon cancer Neg Hx   . Colon polyps Neg Hx   . Rectal cancer Neg Hx   . Stomach cancer Neg Hx      Social History   Socioeconomic History  . Marital status: Married    Spouse name: Randall Hiss  . Number of children: 2  . Years of education: Not on file  . Highest education level: Not on file  Occupational History  . Not on file  Social Needs  . Financial resource strain: Not on file  . Food insecurity    Worry: Not on file    Inability: Not on file  . Transportation needs    Medical: Not on file    Non-medical: Not on file  Tobacco Use  . Smoking status: Never Smoker  . Smokeless tobacco: Never Used  Substance and Sexual Activity  . Alcohol use: Yes    Alcohol/week: 0.0 standard drinks    Comment: socially on weekends   . Drug use: No  . Sexual activity: Never  Lifestyle  . Physical activity    Days per week: Not on file    Minutes per session: Not on file  . Stress: Not on file  Relationships  . Social Herbalist on phone: Not on file    Gets together: Not on file    Attends religious service: Not on file  Active member of club or organization: Not on file    Attends meetings of clubs or organizations: Not on file    Relationship status: Not on file  . Intimate partner violence    Fear of current or ex partner: Not on file    Emotionally abused: Not on file    Physically abused: Not on file    Forced sexual activity: Not on file  Other Topics Concern  . Not on file  Social History Narrative   Daily caffeine. Works on regularly.     Outpatient Medications Prior to Visit  Medication Sig Dispense Refill  . hydrOXYzine (ATARAX/VISTARIL) 25 MG tablet Take 1 tablet (25 mg total) by mouth 3 (three) times daily as needed for anxiety. 60 tablet 2  . lisinopril-hydrochlorothiazide (ZESTORETIC) 20-25 MG tablet Take 1 tablet by mouth daily. 30 tablet 3  . PEG-KCl-NaCl-NaSulf-Na Asc-C (PLENVU) 140 g SOLR Take 1 kit by mouth as directed. 1 each 0  . valACYclovir (VALTREX) 1000 MG tablet Take 2 tablets (2,000 mg total) by mouth 2 (two) times daily. X 1 day. ( 4  tabs per episdoe) 20 tablet 0  . venlafaxine XR (EFFEXOR-XR) 150 MG 24 hr capsule Take 1 capsule (150 mg total) by mouth daily with breakfast. 90 capsule 0  . amphetamine-dextroamphetamine (ADDERALL XR) 30 MG 24 hr capsule Take 1 capsule (30 mg total) by mouth every morning. 30 capsule 0  . amphetamine-dextroamphetamine (ADDERALL XR) 30 MG 24 hr capsule Take 1 capsule (30 mg total) by mouth every morning. 30 capsule 0  . amphetamine-dextroamphetamine (ADDERALL XR) 30 MG 24 hr capsule Take 1 capsule (30 mg total) by mouth every morning. 30 capsule 0   No facility-administered medications prior to visit.     Allergies  Allergen Reactions  . Fluoxetine Other (See Comments)    Sweating, nightmares, insomnia     ROS Review of Systems    Objective:    Physical Exam  Constitutional: She is oriented to person, place, and time. She appears well-developed and well-nourished.  HENT:  Head: Normocephalic and atraumatic.  Eyes: Conjunctivae and EOM are normal.  Cardiovascular: Normal rate.  Pulmonary/Chest: Effort normal.  Neurological: She is alert and oriented to person, place, and time.  Skin: Skin is dry. No pallor.  Psychiatric: She has a normal mood and affect. Her behavior is normal.  Vitals reviewed.   BP 119/85   Pulse 75   Ht '5\' 8"'  (1.727 m)   Wt 231 lb (104.8 kg)   BMI 35.12 kg/m  Wt Readings from Last 3 Encounters:  08/23/18 231 lb (104.8 kg)  08/10/18 221 lb (100.2 kg)  07/18/18 223 lb (101.2 kg)     Health Maintenance Due  Topic Date Due  . COLONOSCOPY  06/06/2018    There are no preventive care reminders to display for this patient.  Lab Results  Component Value Date   TSH 2.06 06/28/2018   Lab Results  Component Value Date   WBC 5.5 06/28/2018   HGB 14.7 06/28/2018   HCT 43.5 06/28/2018   MCV 93.8 06/28/2018   PLT 286 06/28/2018   Lab Results  Component Value Date   NA 141 06/28/2018   K 4.4 06/28/2018   CO2 26 06/28/2018   GLUCOSE 102 (H)  06/28/2018   BUN 21 06/28/2018   CREATININE 0.75 06/28/2018   BILITOT 0.5 06/28/2018   ALKPHOS 49 08/21/2015   AST 24 06/28/2018   ALT 35 (H) 06/28/2018   PROT 7.5 06/28/2018  ALBUMIN 4.6 08/21/2015   CALCIUM 10.0 06/28/2018   Lab Results  Component Value Date   CHOL 223 (H) 06/28/2018   Lab Results  Component Value Date   HDL 62 06/28/2018   Lab Results  Component Value Date   LDLCALC 145 (H) 06/28/2018   Lab Results  Component Value Date   TRIG 68 06/28/2018   Lab Results  Component Value Date   CHOLHDL 3.6 06/28/2018   No results found for: HGBA1C    Assessment & Plan:   Problem List Items Addressed This Visit      Cardiovascular and Mediastinum   Hypertension    Blood pressure actually looks fantastic today.  We are can make a slight change just because we can add in the beta-blocker.  So she is can cut her lisinopril HCTZ in half.  At next visit let us plan to check a BMP.      Relevant Medications   metoprolol succinate (TOPROL-XL) 25 MG 24 hr tablet   Atrial tachycardia (HCC)    Discussed options.  One we can just leave it be.  But to we can consider starting a beta-blocker which would help lower her pulse and decrease her max heart rate while exercising and it might make her more comfortable than feeling like she is having these racing episodes with her heart.  I think it is worth a try so she is can split her lisinopril HCTZ in half and start just a low dose 25 mg extended release metoprolol.  Would like her to track her blood pressures at home over the next couple weeks.  Plan for follow-up in 1 month.      Relevant Medications   metoprolol succinate (TOPROL-XL) 25 MG 24 hr tablet     Other   Attention deficit hyperactivity disorder (ADHD), combined type - Primary    Went ahead and refilled her Adderall for the next 90 days.  3 new prescription sent to the pharmacy.      Abnormal weight gain    Just encouraged her now that we know the results and  overall her heart looks pretty healthy echocardiogram is normal and cardiac tracing is reassuring okay for her to start exercising again encouraged her to get back on track with her diet.         Meds ordered this encounter  Medications  . amphetamine-dextroamphetamine (ADDERALL XR) 30 MG 24 hr capsule    Sig: Take 1 capsule (30 mg total) by mouth every morning.    Dispense:  30 capsule    Refill:  0  . amphetamine-dextroamphetamine (ADDERALL XR) 30 MG 24 hr capsule    Sig: Take 1 capsule (30 mg total) by mouth every morning.    Dispense:  30 capsule    Refill:  0  . amphetamine-dextroamphetamine (ADDERALL XR) 30 MG 24 hr capsule    Sig: Take 1 capsule (30 mg total) by mouth every morning.    Dispense:  30 capsule    Refill:  0  . metoprolol succinate (TOPROL-XL) 25 MG 24 hr tablet    Sig: Take 1 tablet (25 mg total) by mouth daily.    Dispense:  30 tablet    Refill:  2    Follow-up: No follow-ups on file.    Beatrice Lecher, MD

## 2018-08-23 NOTE — Assessment & Plan Note (Signed)
Blood pressure actually looks fantastic today.  We are can make a slight change just because we can add in the beta-blocker.  So she is can cut her lisinopril HCTZ in half.  At next visit let us plan to check a BMP.

## 2018-08-23 NOTE — Assessment & Plan Note (Signed)
Just encouraged her now that we know the results and overall her heart looks pretty healthy echocardiogram is normal and cardiac tracing is reassuring okay for her to start exercising again encouraged her to get back on track with her diet.

## 2018-08-23 NOTE — Assessment & Plan Note (Signed)
Went ahead and refilled her Adderall for the next 90 days.  3 new prescription sent to the pharmacy.

## 2018-08-24 ENCOUNTER — Encounter: Payer: Self-pay | Admitting: Gastroenterology

## 2018-08-24 ENCOUNTER — Ambulatory Visit (AMBULATORY_SURGERY_CENTER): Payer: 59 | Admitting: Gastroenterology

## 2018-08-24 VITALS — BP 119/80 | HR 75 | Temp 98.7°F | Resp 24 | Ht 68.0 in | Wt 221.0 lb

## 2018-08-24 DIAGNOSIS — Z1211 Encounter for screening for malignant neoplasm of colon: Secondary | ICD-10-CM | POA: Diagnosis present

## 2018-08-24 DIAGNOSIS — D122 Benign neoplasm of ascending colon: Secondary | ICD-10-CM | POA: Diagnosis not present

## 2018-08-24 MED ORDER — SODIUM CHLORIDE 0.9 % IV SOLN: 500.0000 mL | Freq: Once | INTRAVENOUS | Status: DC

## 2018-08-24 MED ORDER — METFORMIN HCL 500 MG PO TABS: 500.0000 mg | ORAL_TABLET | Freq: Two times a day (BID) | ORAL | 1 refills | Status: DC

## 2018-08-24 NOTE — Progress Notes (Signed)
Called to room to assist during endoscopic procedure.  Patient ID and intended procedure confirmed with present staff. Received instructions for my participation in the procedure from the performing physician.  

## 2018-08-24 NOTE — Progress Notes (Signed)
To PACU, VSS. Report to [email or web address] 

## 2018-08-24 NOTE — Addendum Note (Signed)
Addended by: Beatrice Lecher D on: 08/24/2018 07:37 AM   Modules accepted: Orders

## 2018-08-24 NOTE — Patient Instructions (Signed)
YOU HAD AN ENDOSCOPIC PROCEDURE TODAY AT THE [facility]:   Refer to the procedure report that was given to you for any specific questions about what was found during the examination.  If the procedure report does not answer your questions, please call your gastroenterologist to clarify.  If you requested that your care partner not be given the details of your procedure findings, then the procedure report has been included in a sealed envelope for you to review at your convenience later.  YOU SHOULD EXPECT: Some feelings of bloating in the abdomen. Passage of more gas than usual.  Walking can help get rid of the air that was put into your GI tract during the procedure and reduce the bloating. If you had a lower endoscopy (such as a colonoscopy or flexible sigmoidoscopy) you may notice spotting of blood in your stool or on the toilet paper. If you underwent a bowel prep for your procedure, you may not have a normal bowel movement for a few days.  Please Note:  You might notice some irritation and congestion in your nose or some drainage.  This is from the oxygen used during your procedure.  There is no need for concern and it should clear up in a day or so.  SYMPTOMS TO REPORT IMMEDIATELY:   Following lower endoscopy (colonoscopy or flexible sigmoidoscopy):  Excessive amounts of blood in the stool  Significant tenderness or worsening of abdominal pains  Swelling of the abdomen that is new, acute  Fever of 100F or higher  For urgent or emergent issues, a gastroenterologist can be reached at any hour by calling [phone].   DIET:  We do recommend a small meal at first, but then you may proceed to your regular diet.  Drink plenty of fluids but you should avoid alcoholic beverages for 24 hours.  ACTIVITY:  You should plan to take it easy for the rest of today and you should NOT DRIVE or use heavy machinery until tomorrow (because of the sedation medicines used during the test).     FOLLOW UP: Our staff will call the number listed on your records 48-72 hours following your procedure to check on you and address any questions or concerns that you may have regarding the information given to you following your procedure. If we do not reach you, we will leave a message.  We will attempt to reach you two times.  During this call, we will ask if you have developed any symptoms of COVID 19. If you develop any symptoms (ie: fever, flu-like symptoms, shortness of breath, cough etc.) before then, please call [phone].  If you test positive for Covid 19 in the 2 weeks post procedure, please call and report this information to us.    If any biopsies were taken you will be contacted by phone or by letter within the next 1-3 weeks.  Please call us at [phone] if you have not heard about the biopsies in 3 weeks.    SIGNATURES/CONFIDENTIALITY: You and/or your care partner have signed paperwork which will be entered into your electronic medical record.  These signatures attest to the fact that that the information above on your After Visit Summary has been reviewed and is understood.  Full responsibility of the confidentiality of this discharge information lies with you and/or your care-partner. 

## 2018-08-24 NOTE — Op Note (Signed)
[facility] Patient Name: Andrea Marquez Procedure Date: 08/24/2018 8:56 AM MRN: [id number] Endoscopist: Dona Ana. Loletha Carrow , MD Age: 48 Referring MD:  Date of Birth: 11-02-1970 Gender: Female Account #: [id number] Procedure:                Colonoscopy Indications:              Screening for colorectal malignant neoplasm, This                            is the patient's first colonoscopy Medicines:                Monitored Anesthesia Care Procedure:                Pre-Anesthesia Assessment:                           - Prior to the procedure, a History and Physical                            was performed, and patient medications and                            allergies were reviewed. The patient's tolerance of                            previous anesthesia was also reviewed. The risks                            and benefits of the procedure and the sedation                            options and risks were discussed with the patient.                            All questions were answered, and informed consent                            was obtained. Prior Anticoagulants: The patient has                            taken no previous anticoagulant or antiplatelet                            agents. ASA Grade Assessment: II - A patient with                            mild systemic disease. After reviewing the risks                            and benefits, the patient was deemed in                            satisfactory condition to undergo the procedure.  After obtaining informed consent, the colonoscope                            was passed under direct vision. Throughout the                            procedure, the patient's blood pressure, pulse, and                            oxygen saturations were monitored continuously. The                            Colonoscope was introduced through the anus and                            advanced to the the cecum,  identified by                            appendiceal orifice and ileocecal valve. The                            colonoscopy was performed without difficulty. The                            patient tolerated the procedure well. The quality                            of the bowel preparation was excellent. The                            ileocecal valve, appendiceal orifice, and rectum                            were photographed. Scope In: 9:15:34 AM Scope Out: 9:29:28 AM Scope Withdrawal Time: 0 hours 8 minutes 48 seconds  Total Procedure Duration: 0 hours 13 minutes 54 seconds  Findings:                 The perianal and digital rectal examinations were                            normal.                           A 5 mm polyp was found in the proximal ascending                            colon. The polyp was sessile. The polyp was removed                            with a cold snare. Resection and retrieval were                            complete.  The exam was otherwise without abnormality on                            direct and retroflexion views. Complications:            No immediate complications. Estimated Blood Loss:     Estimated blood loss was minimal. Impression:               - One 5 mm polyp in the proximal ascending colon,                            removed with a cold snare. Resected and retrieved.                           - The examination was otherwise normal on direct                            and retroflexion views. Recommendation:           - Patient has a contact number available for                            emergencies. The signs and symptoms of potential                            delayed complications were discussed with the                            patient. Return to normal activities tomorrow.                            Written discharge instructions were provided to the                            patient.                            - Resume previous diet.                           - Continue present medications.                           - Await pathology results.                           - Repeat colonoscopy is recommended for                            surveillance. The colonoscopy date will be                            determined after pathology results from today's                            exam become available for review. Andrea Marquez L. Loletha Carrow, MD 08/24/2018 9:32:13 AM This report  has been signed electronically.

## 2018-08-25 ENCOUNTER — Telehealth: Payer: 59 | Admitting: Family Medicine

## 2018-08-28 ENCOUNTER — Telehealth: Payer: Self-pay

## 2018-08-28 ENCOUNTER — Encounter: Payer: Self-pay | Admitting: Gastroenterology

## 2018-08-28 NOTE — Telephone Encounter (Signed)
Attempted to reach pt. With follow-up call following endoscopic procedure 08/24/2018.  Unable to LM.  Phone hung up after multiple rings.  Will try to reach pt. Again later today.

## 2018-08-28 NOTE — Telephone Encounter (Signed)
Left message on 2nd follow up call. 

## 2018-09-14 ENCOUNTER — Other Ambulatory Visit: Payer: Self-pay | Admitting: Family Medicine

## 2018-09-20 ENCOUNTER — Other Ambulatory Visit: Payer: Self-pay | Admitting: Family Medicine

## 2018-10-23 ENCOUNTER — Other Ambulatory Visit: Payer: Self-pay | Admitting: Family Medicine

## 2018-11-26 ENCOUNTER — Other Ambulatory Visit: Payer: Self-pay | Admitting: Family Medicine

## 2018-12-04 ENCOUNTER — Encounter: Payer: Self-pay | Admitting: Family Medicine

## 2018-12-04 ENCOUNTER — Telehealth (INDEPENDENT_AMBULATORY_CARE_PROVIDER_SITE_OTHER): Payer: 59 | Admitting: Family Medicine

## 2018-12-04 VITALS — BP 140/90 | HR 70 | Ht 68.0 in | Wt 234.0 lb

## 2018-12-04 DIAGNOSIS — R635 Abnormal weight gain: Secondary | ICD-10-CM

## 2018-12-04 DIAGNOSIS — H8110 Benign paroxysmal vertigo, unspecified ear: Secondary | ICD-10-CM | POA: Diagnosis not present

## 2018-12-04 DIAGNOSIS — F332 Major depressive disorder, recurrent severe without psychotic features: Secondary | ICD-10-CM | POA: Diagnosis not present

## 2018-12-04 DIAGNOSIS — F411 Generalized anxiety disorder: Secondary | ICD-10-CM | POA: Diagnosis not present

## 2018-12-04 MED ORDER — LISINOPRIL-HYDROCHLOROTHIAZIDE 20-25 MG PO TABS: 1.0000 | ORAL_TABLET | Freq: Every day | ORAL | 1 refills | Status: DC

## 2018-12-04 MED ORDER — VENLAFAXINE HCL ER 150 MG PO CP24: 150.0000 mg | ORAL_CAPSULE | Freq: Every day | ORAL | 1 refills | Status: DC

## 2018-12-04 MED ORDER — METOPROLOL SUCCINATE ER 50 MG PO TB24: 25.0000 mg | ORAL_TABLET | Freq: Every day | ORAL | 1 refills | Status: DC

## 2018-12-04 NOTE — Assessment & Plan Note (Signed)
Mood has improved soon.  Continue current regimen.   PHQ= 13 SWD (PREV=18 SWD), GAD 18=(PREV= 21 SWD)

## 2018-12-04 NOTE — Progress Notes (Signed)
Established Patient Office Visit  Subjective:  Patient ID: Andrea Marquez, female    DOB: 05-14-1970  Age: 48 y.o. MRN: [id number]  CC:  Chief Complaint  Patient presents with  . Palpitations    she gets dizzy when looking down.   . Weight Gain    she reports that she has been watching what she's eating but still has not been able to loose any weight. she feels that her metabolism has decreased along with her muscle strength  . mood    PHQ= 13 SWD (PREV=18 SWD), GAD 18=(PREV= 21 SWD)    HPI Andrea Marquez presents for palpitations.  Please see previous office visit notes we did work this up with an echocardiogram as well as a heart monitor.  The heart monitor did show a run of atrial tachycardia but it did not associate with when she was actually feeling symptoms.  Highest heart rate was in the 160s and so we had discussed putting her on a beta-blocker.  She is currently on metoprolol 25 mg daily.  Echocardiogram was also normal showing an EF of 60 to 65%.  She also complains of weight gain. she reports that she has been watching what she's eating but still has not been able to loose any weight. she feels that her metabolism has decreased along with her muscle strength.  She feels like her diet has not changed at all and she is still very active and is even walking for exercise on top of her daily activity and cannot figure out why she is starting to gain weight again.  She says the Metformin has not been helpful we had tried that for time.  Follow-up depression/anxiety-PHQ-9 score today of 13.  And GAD-7 score of 18.  Some mild improvement from previous.  She has had some positive things.  Her son actually just got his law degree and they actually just had a big celebration.  But he and his girlfriend will be moving out in December and so she is a little bit nervous about that.  With her daughter they have a good relationship but she is concerned that she is abusing alcohol and maybe even  other drugs and this has been very stressful for her.  She also complains of dizziness.  It seems to be worse when she first gets out of bed in the morning.  And with the doing things like bending over and then standing up she said recently she tried to paint her bathroom and had to get her brother to do it because every time she would tilt her head back she would feel dizzy.  She describes it as a "room spinning".  She said it was so bad the other morning that she actually checked her blood pressure thinking it was related to her blood pressure it was a little elevated at 150/96 but pulse was 68.  And then as she checked her blood pressure throughout the day it gradually came down.  Past Medical History:  Diagnosis Date  . ADHD   . Anxiety   . Atrial tachycardia ([place])   . Depression   . GERD (gastroesophageal reflux disease)   . Hypertension   . PONV (postoperative nausea and vomiting)     Past Surgical History:  Procedure Laterality Date  . AUGMENTATION MAMMAPLASTY    . BREAST ENHANCEMENT SURGERY    . LAPAROSCOPY    . NSVD     x2  . OOPHORECTOMY     Single   .  TUBAL LIGATION    . VAGINAL HYSTERECTOMY      Family History  Problem Relation Age of Onset  . COPD Father   . Atrial fibrillation Father   . Esophageal cancer Maternal Grandfather   . Colon cancer Neg Hx   . Colon polyps Neg Hx   . Rectal cancer Neg Hx   . Stomach cancer Neg Hx     Social History   Socioeconomic History  . Marital status: Married    Spouse name: Randall Hiss  . Number of children: 2  . Years of education: Not on file  . Highest education level: Not on file  Occupational History  . Not on file  Social Needs  . Financial resource strain: Not on file  . Food insecurity    Worry: Not on file    Inability: Not on file  . Transportation needs    Medical: Not on file    Non-medical: Not on file  Tobacco Use  . Smoking status: Never Smoker  . Smokeless tobacco: Never Used  Substance and Sexual  Activity  . Alcohol use: Yes    Alcohol/week: 0.0 standard drinks    Comment: socially on weekends   . Drug use: No  . Sexual activity: Never  Lifestyle  . Physical activity    Days per week: Not on file    Minutes per session: Not on file  . Stress: Not on file  Relationships  . Social Herbalist on phone: Not on file    Gets together: Not on file    Attends religious service: Not on file    Active member of club or organization: Not on file    Attends meetings of clubs or organizations: Not on file    Relationship status: Not on file  . Intimate partner violence    Fear of current or ex partner: Not on file    Emotionally abused: Not on file    Physically abused: Not on file    Forced sexual activity: Not on file  Other Topics Concern  . Not on file  Social History Narrative   Daily caffeine. Works on regularly.     Outpatient Medications Prior to Visit  Medication Sig Dispense Refill  . amphetamine-dextroamphetamine (ADDERALL XR) 30 MG 24 hr capsule Take 1 capsule (30 mg total) by mouth every morning. 30 capsule 0  . hydrOXYzine (ATARAX/VISTARIL) 25 MG tablet Take 1 tablet (25 mg total) by mouth 3 (three) times daily as needed for anxiety. 60 tablet 2  . lisinopril-hydrochlorothiazide (ZESTORETIC) 20-25 MG tablet TAKE 1 TABLET BY MOUTH EVERY DAY 90 tablet 0  . metFORMIN (GLUCOPHAGE) 500 MG tablet Take 1 tablet (500 mg total) by mouth 2 (two) times daily with a meal. Due for follow up 60 tablet 0  . metoprolol succinate (TOPROL-XL) 25 MG 24 hr tablet TAKE 1 TABLET BY MOUTH EVERY DAY 30 tablet 2  . venlafaxine XR (EFFEXOR-XR) 150 MG 24 hr capsule TAKE 1 CAPSULE (150 MG TOTAL) BY MOUTH DAILY WITH BREAKFAST. 90 capsule 0  . valACYclovir (VALTREX) 1000 MG tablet Take 2 tablets (2,000 mg total) by mouth 2 (two) times daily. X 1 day. ( 4 tabs per episdoe) 20 tablet 0   No facility-administered medications prior to visit.     Allergies  Allergen Reactions  .  Fluoxetine Other (See Comments)    Sweating, nightmares, insomnia     ROS Review of Systems    Objective:    Physical Exam  Constitutional: She is oriented to person, place, and time. She appears well-developed and well-nourished.  HENT:  Head: Normocephalic and atraumatic.  Eyes: Conjunctivae and EOM are normal.  Pulmonary/Chest: Effort normal.  Neurological: She is alert and oriented to person, place, and time.  Skin: Skin is dry. No pallor.  Psychiatric: She has a normal mood and affect. Her behavior is normal.  Vitals reviewed.   BP 140/90   Pulse 70   Ht 5\' 8"  (1.727 m)   Wt 234 lb (106.1 kg)   BMI 35.58 kg/m  Wt Readings from Last 3 Encounters:  12/04/18 234 lb (106.1 kg)  08/24/18 221 lb (100.2 kg)  08/23/18 231 lb (104.8 kg)     There are no preventive care reminders to display for this patient.  There are no preventive care reminders to display for this patient.  Lab Results  Component Value Date   TSH 2.06 06/28/2018   Lab Results  Component Value Date   WBC 5.5 06/28/2018   HGB 14.7 06/28/2018   HCT 43.5 06/28/2018   MCV 93.8 06/28/2018   PLT 286 06/28/2018   Lab Results  Component Value Date   NA 141 06/28/2018   K 4.4 06/28/2018   CO2 26 06/28/2018   GLUCOSE 102 (H) 06/28/2018   BUN 21 06/28/2018   CREATININE 0.75 06/28/2018   BILITOT 0.5 06/28/2018   ALKPHOS 49 08/21/2015   AST 24 06/28/2018   ALT 35 (H) 06/28/2018   PROT 7.5 06/28/2018   ALBUMIN 4.6 08/21/2015   CALCIUM 10.0 06/28/2018   Lab Results  Component Value Date   CHOL 223 (H) 06/28/2018   Lab Results  Component Value Date   HDL 62 06/28/2018   Lab Results  Component Value Date   LDLCALC 145 (H) 06/28/2018   Lab Results  Component Value Date   TRIG 68 06/28/2018   Lab Results  Component Value Date   CHOLHDL 3.6 06/28/2018   No results found for: HGBA1C    Assessment & Plan:   Problem List Items Addressed This Visit      Other   Severe episode of  recurrent major depressive disorder, without psychotic features ([place])    Mood has improved soon.  Continue current regimen.   PHQ= 13 SWD (PREV=18 SWD), GAD 18=(PREV= 21 SWD)      Relevant Medications   venlafaxine XR (EFFEXOR-XR) 150 MG 24 hr capsule   Generalized anxiety disorder - Primary   Relevant Medications   venlafaxine XR (EFFEXOR-XR) 150 MG 24 hr capsule   Abnormal weight gain   Relevant Orders   Amb Referral to Bariatric Surgery    Other Visit Diagnoses    Benign paroxysmal positional vertigo, unspecified laterality       Relevant Orders   Ambulatory referral to Physical Therapy     BPPV-discussed treatment options including referral to vestibular rehab versus home therapy.  Since she is not here for a in person visit to determine if her BPPV is right or left-sided then I recommend formal vestibular rehab.  Abnormal weight gain-discontinue Metformin since it has not been helpful.  Discussed referral to bariatric clinician for further guidance and treatment.  It sounds like she really is trying to eat healthy and has been trying to exercise regularly and in fact has actually gained weight.  Meds ordered this encounter  Medications  . lisinopril-hydrochlorothiazide (ZESTORETIC) 20-25 MG tablet    Sig: Take 1 tablet by mouth daily.    Dispense:  90 tablet  Refill:  1  . metoprolol succinate (TOPROL-XL) 50 MG 24 hr tablet    Sig: Take 1 tablet (50 mg total) by mouth daily.    Dispense:  90 tablet    Refill:  1  . venlafaxine XR (EFFEXOR-XR) 150 MG 24 hr capsule    Sig: Take 1 capsule (150 mg total) by mouth daily with breakfast.    Dispense:  90 capsule    Refill:  1    Follow-up: No follow-ups on file.    Beatrice Lecher, MD

## 2018-12-12 ENCOUNTER — Encounter: Payer: Self-pay | Admitting: Family Medicine

## 2018-12-12 ENCOUNTER — Ambulatory Visit (INDEPENDENT_AMBULATORY_CARE_PROVIDER_SITE_OTHER): Payer: 59 | Admitting: Rehabilitative and Restorative Service Providers"

## 2018-12-12 ENCOUNTER — Encounter: Payer: Self-pay | Admitting: Rehabilitative and Restorative Service Providers"

## 2018-12-12 ENCOUNTER — Ambulatory Visit (INDEPENDENT_AMBULATORY_CARE_PROVIDER_SITE_OTHER): Payer: 59 | Admitting: Family Medicine

## 2018-12-12 ENCOUNTER — Other Ambulatory Visit: Payer: Self-pay

## 2018-12-12 VITALS — BP 121/74 | HR 86 | Ht 68.0 in | Wt 232.0 lb

## 2018-12-12 DIAGNOSIS — Z23 Encounter for immunization: Secondary | ICD-10-CM

## 2018-12-12 DIAGNOSIS — R070 Pain in throat: Secondary | ICD-10-CM

## 2018-12-12 DIAGNOSIS — H9202 Otalgia, left ear: Secondary | ICD-10-CM

## 2018-12-12 DIAGNOSIS — R42 Dizziness and giddiness: Secondary | ICD-10-CM | POA: Diagnosis not present

## 2018-12-12 DIAGNOSIS — R2689 Other abnormalities of gait and mobility: Secondary | ICD-10-CM

## 2018-12-12 MED ORDER — ACETIC ACID 2 % OT SOLN: 4.0000 [drp] | Freq: Three times a day (TID) | OTIC | 0 refills | Status: DC

## 2018-12-12 MED ORDER — PREDNISONE 20 MG PO TABS: 40.0000 mg | ORAL_TABLET | Freq: Every day | ORAL | 0 refills | Status: DC

## 2018-12-12 NOTE — Progress Notes (Signed)
She report her sxs with her ear started at the end of last week. And she also has some neck/throat pain on the L side.Elouise Munroe, [place]

## 2018-12-12 NOTE — Therapy (Signed)
[facility] [place], [place], [address] Phone: [phone]   Fax:  [phone]  Physical Therapy Evaluation  Patient Details  Name: Andrea Marquez MRN: [id number] Date of Birth: 11-Jan-1971 Referring Provider (PT): Hali Marry, MD   Encounter Date: 12/12/2018  PT End of Session - 12/12/18 1306    Visit Number  1    Number of Visits  6    Date for PT Re-Evaluation  01/26/19    Authorization Type  [employer]    PT Start Time  [id number]    PT Stop Time  1100    PT Time Calculation (min)  31 min       Past Medical History:  Diagnosis Date  . ADHD   . Anxiety   . Atrial tachycardia ([place])   . Depression   . GERD (gastroesophageal reflux disease)   . Hypertension   . PONV (postoperative nausea and vomiting)     Past Surgical History:  Procedure Laterality Date  . AUGMENTATION MAMMAPLASTY    . BREAST ENHANCEMENT SURGERY    . LAPAROSCOPY    . NSVD     x2  . OOPHORECTOMY     Single   . TUBAL LIGATION    . VAGINAL HYSTERECTOMY      There were no vitals filed for this visit.   Subjective Assessment - 12/12/18 1031    Subjective  The patient began with a tickle in her ear in 2018.  She noted sensations of lightheadedness and imbalance intermittently and attributed it to low iron.  In May 2020, she began with huge episodes of vertigo.  She reports if she got up to use the bathroom the whole world would be spinning.  She also experienced an episode of losing her balance to the left side while walking.   She has currently been experiencing daily vertigo with L ear pain.  Last week, the sensation was constant with some body numbness (like a floating sensation).   She does note still having some tickling sensation in her ear.  Symptoms are worsened by head motion.    Pertinent History  low back pain (h/o herniated disc), hypertension    Patient Stated Goals  Not to experience vertigo    Currently in Pain?  Yes     Pain Location  Ear    Pain Orientation  Left    Pain Descriptors / Indicators  Aching;Dull    Pain Onset  --   intermittently > 1 month   Pain Frequency  Constant         [facility] PT Assessment - 12/12/18 1307      Assessment   Medical Diagnosis  H81.10 (ICD-10-CM) - Benign paroxysmal positional vertigo, unspecified laterality    Referring Provider (PT)  Hali Marry, MD    Onset Date/Surgical Date  --   05/2018   Prior Therapy  none      Precautions   Precautions  None      Balance Screen   Has the patient fallen in the past 6 months  No    Has the patient had a decrease in activity level because of a fear of falling?   No    Is the patient reluctant to leave their home because of a fear of falling?   No      Home Film/video editor residence      Prior Function   Level of Independence  Independent  Vestibular Assessment - 12/12/18 1038      Vestibular Assessment   General Observation  She reports vision changes of general blurriness (she does ot think it is related to dry eye), denies hearing changes.   Did have migraines prior to having kids.       Symptom Behavior   Subjective history of current problem  Current episode is the worst it has ever been.    Type of Dizziness   Spinning;Imbalance   rocking sensation   Frequency of Dizziness  daily    Duration of Dizziness  worse with movement, sensation of general vertigo; has some days where it is not present    Symptom Nature  Motion provoked   spinning wakes her up   Aggravating Factors  Activity in general;Looking up to the ceiling;Turning head quickly   wakes up with spinning   Relieving Factors  Head stationary    Progression of Symptoms  Worse    History of similar episodes  began in 2018, is worsening      Oculomotor Exam   Oculomotor Alignment  Normal    Ocular ROM  WFLs    Spontaneous  Absent    Gaze-induced   Absent    Smooth Pursuits  Intact     Saccades  Intact      Vestibulo-Ocular Reflex   VOR 1 Head Only (x 1 viewing)  x 5 reps at self regulated pace, patient has difficulty maintaining gaze with L head rotation    Comment  Head impulse test= + left refixation saccade (without lenses); with lenses donned, the patient has a covert saccade noted where she is correcting to target during the movement.      Positional Testing   Dix-Hallpike  Dix-Hallpike Right;Dix-Hallpike Left    Sidelying Test  Sidelying Right;Sidelying Left    Horizontal Canal Testing  Horizontal Canal Right;Horizontal Canal Left      Dix-Hallpike Right   Dix-Hallpike Right Duration  none    Dix-Hallpike Right Symptoms  No nystagmus      Dix-Hallpike Left   Dix-Hallpike Left Duration  none    Dix-Hallpike Left Symptoms  No nystagmus      Sidelying Right   Sidelying Right Duration  trace of dizziness (sensation of movement to the right)-- nystagmus not viewed in room light    Sidelying Right Symptoms  No nystagmus      Sidelying Left   Sidelying Left Duration  none    Sidelying Left Symptoms  No nystagmus      Horizontal Canal Right   Horizontal Canal Right Duration  none    Horizontal Canal Right Symptoms  Normal      Horizontal Canal Left   Horizontal Canal Left Duration  none    Horizontal Canal Left Symptoms  Normal          Objective measurements completed on examination: See above findings.       Vestibular Treatment/Exercise - 12/12/18 1052      Vestibular Treatment/Exercise   Vestibular Treatment Provided  Habituation;Gaze    Habituation Exercises  Nestor Lewandowsky    Gaze Exercises  X1 Viewing Horizontal      Nestor Lewandowsky   Number of Reps   3      X1 Viewing Horizontal   Foot Position  standing feet apart    Comments  30 seconds with cues on technique and visual fixation            PT Education - 12/12/18 1306  Education Details  HEP established    Person(s) Educated  Patient    Methods   Explanation;Demonstration;Handout    Comprehension  Verbalized understanding;Returned demonstration          PT Long Term Goals - 12/12/18 1309      PT LONG TERM GOAL #1   Title  The patient will be indep with HEP progression.    Time  6    Period  Weeks    Target Date  01/26/19      PT LONG TERM GOAL #2   Title  The patient will tolerate gaze x 1 adaptation x 60 seconds without visual blurring.    Time  6    Period  Weeks    Target Date  01/26/19      PT LONG TERM GOAL #3   Title  The patient will be further assessed for multi-sensory balance strategies and HEP to follow, if indicated.    Time  6    Period  Weeks    Target Date  01/26/19      PT LONG TERM GOAL #4   Title  The patient will report 50% reduction in dizziness.    Time  6    Period  Weeks    Target Date  01/26/19             Plan - 12/12/18 1312    Clinical Impression Statement  The patient is a 49 yo female presenting to OP physical therapy for dizziness.  She has positive head impulse test to L side indicating decreased VOR, decreased tolerance to gaze adaptation.  PT to address deficits to promote improved mobility during IADls/recreational activities.    Examination-Activity Limitations  Locomotion Level;Bend    Stability/Clinical Decision Making  Stable/Uncomplicated    Clinical Decision Making  Low    Rehab Potential  Good    PT Frequency  1x / week    PT Duration  6 weeks    PT Treatment/Interventions  ADLs/Self Care Home Management;Canalith Repostioning;Vestibular;Patient/family education;Neuromuscular re-education;Gait training;Functional mobility training;Therapeutic activities;Therapeutic exercise;Balance training    PT Next Visit Plan  check HEP and progress for habituation, gaze adaptation.    Consulted and Agree with Plan of Care  Patient       Patient will benefit from skilled therapeutic intervention in order to improve the following deficits and impairments:  Dizziness, Decreased  activity tolerance, Decreased balance  Visit Diagnosis: Dizziness and giddiness  Other abnormalities of gait and mobility     Problem List Patient Active Problem List   Diagnosis Date Noted  . Atrial tachycardia ([place]) 08/23/2018  . Hyperhidrosis 07/18/2018  . Stress incontinence 07/18/2018  . Frequent urination 07/18/2018  . Hematuria 10/24/2017  . Attention deficit hyperactivity disorder (ADHD), combined type 10/07/2017  . Coccyx pain 10/07/2017  . 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 Severe episode of recurrent major depressive disorder, without psychotic features ([place]) 08/21/2015  . Generalized anxiety disorder 08/21/2015  . Hypertension 04/07/2015  . Abnormal weight gain 04/07/2015  . Shingles 05/05/2012  . Left lumbar radiculitis 03/17/2012  . OVERWEIGHT 03/21/2008  . INSOMNIA 06/20/2007  . Depression with anxiety 04/19/2007    [place], PT 12/12/2018, 1:24 PM  [facility] [place], [place], [address] Phone: [phone]   Fax:  [phone]  Name: Andrea Marquez MRN: [id number] Date of Birth: [birth year]

## 2018-12-12 NOTE — Progress Notes (Signed)
Acute Office Visit  Subjective:    Patient ID: Andrea Marquez, female    DOB: May 24, 1970, 48 y.o.   MRN: [id number]  Chief Complaint  Patient presents with  . Ear Pain    HPI Patient is in today for left ear pain. She says about a month ago noticed some pain and itching about a month ago.  Says it started about a week ago again so had som old vosol drops and it bubbled. Using IBU.  No drainage. No URI sxs or headaches.  No loss of taste or smell.  He says most like a pressure or ache sensation it does feel like it is going a little bit down to the left side of the neck and sometimes almost into the jaw area.  She did start her initial physical therapy treatment for vestibular rehab for positional vertigo.  She does have the home exercise and is starting to do those.  She is also following up with them in about 10 days.  Past Medical History:  Diagnosis Date  . ADHD   . Anxiety   . Atrial tachycardia ([place])   . Depression   . GERD (gastroesophageal reflux disease)   . Hypertension   . PONV (postoperative nausea and vomiting)     Past Surgical History:  Procedure Laterality Date  . AUGMENTATION MAMMAPLASTY    . BREAST ENHANCEMENT SURGERY    . LAPAROSCOPY    . NSVD     x2  . OOPHORECTOMY     Single   . TUBAL LIGATION    . VAGINAL HYSTERECTOMY      Family History  Problem Relation Age of Onset  . COPD Father   . Atrial fibrillation Father   . Esophageal cancer Maternal Grandfather   . Colon cancer Neg Hx   . Colon polyps Neg Hx   . Rectal cancer Neg Hx   . Stomach cancer Neg Hx     Social History   Socioeconomic History  . Marital status: Married    Spouse name: Randall Hiss  . Number of children: 2  . Years of education: Not on file  . Highest education level: Not on file  Occupational History  . Not on file  Social Needs  . Financial resource strain: Not on file  . Food insecurity    Worry: Not on file    Inability: Not on file  . Transportation needs   Medical: Not on file    Non-medical: Not on file  Tobacco Use  . Smoking status: Never Smoker  . Smokeless tobacco: Never Used  Substance and Sexual Activity  . Alcohol use: Yes    Alcohol/week: 0.0 standard drinks    Comment: socially on weekends   . Drug use: No  . Sexual activity: Never  Lifestyle  . Physical activity    Days per week: Not on file    Minutes per session: Not on file  . Stress: Not on file  Relationships  . Social Herbalist on phone: Not on file    Gets together: Not on file    Attends religious service: Not on file    Active member of club or organization: Not on file    Attends meetings of clubs or organizations: Not on file    Relationship status: Not on file  . Intimate partner violence    Fear of current or ex partner: Not on file    Emotionally abused: Not on file  Physically abused: Not on file    Forced sexual activity: Not on file  Other Topics Concern  . Not on file  Social History Narrative   Daily caffeine. Works on regularly.     Outpatient Medications Prior to Visit  Medication Sig Dispense Refill  . amphetamine-dextroamphetamine (ADDERALL XR) 30 MG 24 hr capsule Take 1 capsule (30 mg total) by mouth every morning. 30 capsule 0  . hydrOXYzine (ATARAX/VISTARIL) 25 MG tablet Take 1 tablet (25 mg total) by mouth 3 (three) times daily as needed for anxiety. 60 tablet 2  . lisinopril-hydrochlorothiazide (ZESTORETIC) 20-25 MG tablet Take 1 tablet by mouth daily. 90 tablet 1  . metoprolol succinate (TOPROL-XL) 50 MG 24 hr tablet Take 1 tablet (50 mg total) by mouth daily. 90 tablet 1  . venlafaxine XR (EFFEXOR-XR) 150 MG 24 hr capsule Take 1 capsule (150 mg total) by mouth daily with breakfast. 90 capsule 1   No facility-administered medications prior to visit.     Allergies  Allergen Reactions  . Fluoxetine Other (See Comments)    Sweating, nightmares, insomnia     ROS     Objective:    Physical Exam  Constitutional:  She is oriented to person, place, and time. She appears well-developed and well-nourished.  HENT:  Head: Normocephalic and atraumatic.  Right Ear: External ear normal.  Left Ear: External ear normal.  Nose: Nose normal.  Mouth/Throat: Oropharynx is clear and moist.  TMs and canals are clear.   Eyes: Pupils are equal, round, and reactive to light. Conjunctivae and EOM are normal.  Neck: Neck supple. No thyromegaly present.  Pulmonary/Chest: She has no wheezes.  Lymphadenopathy:    She has no cervical adenopathy.  Neurological: She is alert and oriented to person, place, and time.  Skin: Skin is warm and dry.  Psychiatric: She has a normal mood and affect. Her behavior is normal.    BP 121/74   Pulse 86   Ht 5\' 8"  (1.727 m)   Wt 232 lb (105.2 kg)   SpO2 98%   BMI 35.28 kg/m  Wt Readings from Last 3 Encounters:  12/12/18 232 lb (105.2 kg)  12/04/18 234 lb (106.1 kg)  08/24/18 221 lb (100.2 kg)    There are no preventive care reminders to display for this patient.  There are no preventive care reminders to display for this patient.   Lab Results  Component Value Date   TSH 2.06 06/28/2018   Lab Results  Component Value Date   WBC 5.5 06/28/2018   HGB 14.7 06/28/2018   HCT 43.5 06/28/2018   MCV 93.8 06/28/2018   PLT 286 06/28/2018   Lab Results  Component Value Date   NA 141 06/28/2018   K 4.4 06/28/2018   CO2 26 06/28/2018   GLUCOSE 102 (H) 06/28/2018   BUN 21 06/28/2018   CREATININE 0.75 06/28/2018   BILITOT 0.5 06/28/2018   ALKPHOS 49 08/21/2015   AST 24 06/28/2018   ALT 35 (H) 06/28/2018   PROT 7.5 06/28/2018   ALBUMIN 4.6 08/21/2015   CALCIUM 10.0 06/28/2018   Lab Results  Component Value Date   CHOL 223 (H) 06/28/2018   Lab Results  Component Value Date   HDL 62 06/28/2018   Lab Results  Component Value Date   LDLCALC 145 (H) 06/28/2018   Lab Results  Component Value Date   TRIG 68 06/28/2018   Lab Results  Component Value Date    CHOLHDL 3.6 06/28/2018   No  results found for: HGBA1C     Assessment & Plan:   Problem List Items Addressed This Visit    None    Visit Diagnoses    Left ear pain    -  Primary   Need for immunization against influenza       Relevant Orders   Flu Vaccine QUAD 36+ mos IM (Completed)   Throat pain         Left ear pain/itching-do not think this is actually causing her vertigo but certainly we can try a round of prednisone I did not see anything that looked worrisome for any type of ear infection.  No swollen lymph nodes in the neck.  Also consider TMJ as a possibility if she is not improving.  If the oral prednisone does help then recommend a trial of fluticasone nasal as it could also be somewhat allergic causing the fullness and pressure and itching.  Left sided throat pain - exam is normal. No lymphadenopathy.    Meds ordered this encounter  Medications  . predniSONE (DELTASONE) 20 MG tablet    Sig: Take 2 tablets (40 mg total) by mouth daily with breakfast. Then 1 tab QD x 2 days.    Dispense:  12 tablet    Refill:  0  . acetic acid 2 % otic solution    Sig: Place 4 drops into the left ear 3 (three) times daily. For ear itching    Dispense:  15 mL    Refill:  0     Beatrice Lecher, MD

## 2018-12-12 NOTE — Patient Instructions (Signed)
Access Code: [email or web address]  URL: [email or web address]  Date: 12/12/2018  Prepared by: Rudell Cobb   Exercises Brandt-Daroff Vestibular Exercise - 5 reps - 1 sets - 2x daily - 7x weekly Standing Gaze Stabilization with Head Rotation - 1 reps - 2 sets - 30 seconds hold - 3x daily - 7x weekly

## 2018-12-26 ENCOUNTER — Other Ambulatory Visit: Payer: Self-pay

## 2018-12-26 ENCOUNTER — Ambulatory Visit (INDEPENDENT_AMBULATORY_CARE_PROVIDER_SITE_OTHER): Payer: 59 | Admitting: Rehabilitative and Restorative Service Providers"

## 2018-12-26 DIAGNOSIS — R42 Dizziness and giddiness: Secondary | ICD-10-CM

## 2018-12-26 DIAGNOSIS — R2689 Other abnormalities of gait and mobility: Secondary | ICD-10-CM | POA: Diagnosis not present

## 2018-12-26 NOTE — Therapy (Addendum)
[place], [place], [address] Phone: [phone]   Fax:  [phone]  Physical Therapy Treatment and Discharge Summary  Patient Details  Name: Andrea Marquez MRN: [id number] Date of Birth: January 31, 1970 Referring Provider (PT): Hali Marry, MD   Encounter Date: 12/26/2018  PT End of Session - 12/26/18 1103    Visit Number  2    Number of Visits  6    Date for PT Re-Evaluation  01/26/19    Authorization Type  [employer]    PT Start Time  1010    PT Stop Time  1050    PT Time Calculation (min)  40 min    Activity Tolerance  Patient tolerated treatment well    Behavior During Therapy  [facility] for tasks assessed/performed       Past Medical History:  Diagnosis Date  . ADHD   . Anxiety   . Atrial tachycardia ([place])   . Depression   . GERD (gastroesophageal reflux disease)   . Hypertension   . PONV (postoperative nausea and vomiting)     Past Surgical History:  Procedure Laterality Date  . AUGMENTATION MAMMAPLASTY    . BREAST ENHANCEMENT SURGERY    . LAPAROSCOPY    . NSVD     x2  . OOPHORECTOMY     Single   . TUBAL LIGATION    . VAGINAL HYSTERECTOMY      There were no vitals filed for this visit.  Subjective Assessment - 12/26/18 1010    Subjective  The patient notes exercises are working because she is not dizzy, but gets a sensation of "queasiness" when rolling to the left.  She cannot tolerate > 30 seconds on gaze adaptation due to queasiness. She got prednisone from MD last week and feels that improved with ear pain, but continues with itching in her ears.  The patient reports that she fell the day before thanksgiving when stepping over a small baby gate.    Pertinent History  low back pain (h/o herniated disc), hypertension    Patient Stated Goals  Not to experience vertigo             Vestibular Assessment - 12/26/18 1023      Positional Testing   Sidelying Test  Sidelying  Right;Sidelying Left    Horizontal Canal Testing  Horizontal Canal Right;Horizontal Canal Left      Sidelying Right   Sidelying Right Duration  none moving onto R side, mild dizziness returning to sitting    Sidelying Right Symptoms  No nystagmus      Sidelying Left   Sidelying Left Duration  none    Sidelying Left Symptoms  No nystagmus      Horizontal Canal Right   Horizontal Canal Right Duration  none    Horizontal Canal Right Symptoms  Normal      Horizontal Canal Left   Horizontal Canal Left Duration  none    Horizontal Canal Left Symptoms  Normal               [facility] Adult PT Treatment/Exercise - 12/26/18 1040      Self-Care   Self-Care  Other Self-Care Comments    Other Self-Care Comments   The patient and PT discussed return to walking program, progression of exercises and self management of symptoms due to recurring nature.      Neuro Re-ed    Neuro Re-ed Details   The patient performed compliant surface standing  with eyes open, eyes closed, and then compliant surface with horizontal and vertical head motion.       Vestibular Treatment/Exercise - 12/26/18 1023      Vestibular Treatment/Exercise   Vestibular Treatment Provided  Habituation;Gaze    Habituation Exercises  Laruth Bouchard Daroff;Horizontal Roll    Gaze Exercises  X1 Viewing Horizontal;X1 Viewing Vertical      Nestor Lewandowsky   Number of Reps   2    Symptom Description   mild symptoms on first rep      Horizontal Roll   Number of Reps   3    Symptom Description   mild symptoms on first rep, resolves with repetition.      X1 Viewing Horizontal   Foot Position  standing feet apart      X1 Viewing Vertical   Foot Position  standing feet apart            PT Education - 12/26/18 1307    Education Details  HEP updated.    Person(s) Educated  Patient    Methods  Explanation;Demonstration;Handout    Comprehension  Verbalized understanding;Returned demonstration          PT Long Term Goals  - 12/26/18 1030      PT LONG TERM GOAL #1   Title  The patient will be indep with HEP progression.    Time  6    Period  Weeks      PT LONG TERM GOAL #2   Title  The patient will tolerate gaze x 1 adaptation x 60 seconds without visual blurring.    Time  6    Period  Weeks      PT LONG TERM GOAL #3   Title  The patient will be further assessed for multi-sensory balance strategies and HEP to follow, if indicated.    Baseline  provided balance HEP.    Time  6    Period  Weeks    Status  Achieved      PT LONG TERM GOAL #4   Title  The patient will report 50% reduction in dizziness.    Baseline  Dizziness is 80% better.    Time  6    Period  Weeks    Status  Achieved            Plan - 12/26/18 1307    Clinical Impression Statement  The patient met 2 LTGs.  Plan to continue progressing to LTGs.  She notes 80-90% improvement in symptoms since evaluation.  Continue to LTGs.    PT Treatment/Interventions  ADLs/Self Care Home Management;Canalith Repostioning;Vestibular;Patient/family education;Neuromuscular re-education;Gait training;Functional mobility training;Therapeutic activities;Therapeutic exercise;Balance training    PT Next Visit Plan  check HEP and progress for habituation, gaze adaptation.    Consulted and Agree with Plan of Care  Patient       Patient will benefit from skilled therapeutic intervention in order to improve the following deficits and impairments:     Visit Diagnosis: Dizziness and giddiness  Other abnormalities of gait and mobility     Problem List Patient Active Problem List   Diagnosis Date Noted  . Atrial tachycardia ([place]) 08/23/2018  . Hyperhidrosis 07/18/2018  . Stress incontinence 07/18/2018  . Frequent urination 07/18/2018  . Hematuria 10/24/2017  . Attention deficit hyperactivity disorder (ADHD), combined type 10/07/2017  . Coccyx pain 10/07/2017  . 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 Severe episode of recurrent  major depressive disorder, without psychotic features ([place]) 08/21/2015  .  Generalized anxiety disorder 08/21/2015  . Hypertension 04/07/2015  . Abnormal weight gain 04/07/2015  . Shingles 05/05/2012  . Left lumbar radiculitis 03/17/2012  . OVERWEIGHT 03/21/2008  . INSOMNIA 06/20/2007  . Depression with anxiety 04/19/2007    PHYSICAL THERAPY DISCHARGE SUMMARY  Visits from Start of Care: see above  Current functional level related to goals / functional outcomes: See above   Remaining deficits: See eval-- patient did not return   Education / Equipment: Initial HEP established  Plan: Patient agrees to discharge.  Patient goals were not met. Patient is being discharged due to a change in medical status.  ?????         Thank you for the referral of this patient. Rudell Cobb, MPT   [place], [place] 12/26/2018, 1:10 PM  [facility] [place], [place], [address] Phone: [phone]   Fax:  [phone]  Name: Andrea Marquez MRN: [id number] Date of Birth: 1970/12/14

## 2018-12-26 NOTE — Patient Instructions (Signed)
Access Code: [email or web address]  URL: [email or web address]  Date: 12/26/2018  Prepared by: Rudell Cobb   Program Notes  If you have 2 days in a row that the first 2 exercises (sit<>sidelying, rolling) do not bring on dizziness, nausea, or a sensation of eyes and head having to catch up, you can stop those. On exercises with foam, you can bring your feet more narrow as you can tolerate.   Exercises Brandt-Daroff Vestibular Exercise - 5 reps - 1 sets - 2x daily - 7x weekly Rolling right<>left sides for vestibular habituation - 5 reps - 1 sets                            - 2x daily - 7x weekly Standing Gaze Stabilization with Head Rotation - 1 reps - 2 sets - 30 seconds hold - 3x daily - 7x weekly Standing Gaze Stabilization with Head Nod - 1 reps - 2 sets - 30 seconds hold - 3x daily - 7x weekly Wide Stance with Eyes Closed on Foam Pad - 1 reps - 1 sets - 30 seconds hold - 2x daily - 7x weekly Wide Stance with Head Rotation on Foam Pad - 10 reps - 1 sets - 2x daily - 7x weekly Wide Stance with Head Nods on Foam Pad - 10 reps - 1 sets - 2x daily - 7x weekly

## 2019-01-10 ENCOUNTER — Encounter: Payer: 59 | Admitting: Rehabilitative and Restorative Service Providers"

## 2019-02-06 ENCOUNTER — Encounter: Payer: Self-pay | Admitting: Family Medicine

## 2019-02-06 ENCOUNTER — Other Ambulatory Visit: Payer: Self-pay | Admitting: Family Medicine

## 2019-02-08 NOTE — Telephone Encounter (Signed)
Received RX request from pharmacy. Willl address there

## 2019-02-09 MED ORDER — AMPHETAMINE-DEXTROAMPHET ER 30 MG PO CP24: 30.0000 mg | ORAL_CAPSULE | ORAL | 0 refills | Status: DC

## 2019-02-11 ENCOUNTER — Encounter: Payer: Self-pay | Admitting: Family Medicine

## 2019-03-24 ENCOUNTER — Other Ambulatory Visit: Payer: Self-pay | Admitting: Family Medicine

## 2019-03-24 DIAGNOSIS — F411 Generalized anxiety disorder: Secondary | ICD-10-CM

## 2019-03-24 DIAGNOSIS — F332 Major depressive disorder, recurrent severe without psychotic features: Secondary | ICD-10-CM

## 2019-03-26 ENCOUNTER — Other Ambulatory Visit: Payer: Self-pay

## 2019-03-26 NOTE — Telephone Encounter (Signed)
Sent patient a MyChart message asking patient to schedule a follow up appointment.

## 2019-03-27 MED ORDER — AMPHETAMINE-DEXTROAMPHET ER 30 MG PO CP24: 30.0000 mg | ORAL_CAPSULE | ORAL | 0 refills | Status: DC

## 2019-03-28 NOTE — Telephone Encounter (Signed)
Her prescription was filled yesterday so it should be at her pharmacy available to pick up.  It has been over 4 months since I have seen her for her ADHD.  She was here in November but was seen for her ear so we do need to get her scheduled but she is welcome to wait until the end of the month before her next refill and that way we can get them set up for the next 4 months.

## 2019-04-05 ENCOUNTER — Ambulatory Visit (INDEPENDENT_AMBULATORY_CARE_PROVIDER_SITE_OTHER): Payer: 59 | Admitting: Family Medicine

## 2019-04-05 ENCOUNTER — Other Ambulatory Visit: Payer: Self-pay

## 2019-04-05 ENCOUNTER — Encounter: Payer: Self-pay | Admitting: Family Medicine

## 2019-04-05 VITALS — BP 108/75 | HR 77 | Ht 68.0 in | Wt 241.0 lb

## 2019-04-05 DIAGNOSIS — I10 Essential (primary) hypertension: Secondary | ICD-10-CM

## 2019-04-05 DIAGNOSIS — F411 Generalized anxiety disorder: Secondary | ICD-10-CM

## 2019-04-05 DIAGNOSIS — F332 Major depressive disorder, recurrent severe without psychotic features: Secondary | ICD-10-CM

## 2019-04-05 DIAGNOSIS — R0789 Other chest pain: Secondary | ICD-10-CM

## 2019-04-05 DIAGNOSIS — B001 Herpesviral vesicular dermatitis: Secondary | ICD-10-CM

## 2019-04-05 DIAGNOSIS — L299 Pruritus, unspecified: Secondary | ICD-10-CM

## 2019-04-05 DIAGNOSIS — F902 Attention-deficit hyperactivity disorder, combined type: Secondary | ICD-10-CM

## 2019-04-05 DIAGNOSIS — F418 Other specified anxiety disorders: Secondary | ICD-10-CM

## 2019-04-05 MED ORDER — VALACYCLOVIR HCL 1 G PO TABS: 2000.0000 mg | ORAL_TABLET | Freq: Two times a day (BID) | ORAL | 0 refills | Status: DC

## 2019-04-05 MED ORDER — VENLAFAXINE HCL ER 37.5 MG PO CP24: 37.5000 mg | ORAL_CAPSULE | Freq: Every day | ORAL | 1 refills | Status: DC

## 2019-04-05 MED ORDER — HYDROXYZINE HCL 25 MG PO TABS: 25.0000 mg | ORAL_TABLET | Freq: Three times a day (TID) | ORAL | 2 refills | Status: DC | PRN

## 2019-04-05 NOTE — Progress Notes (Signed)
Established Patient Office Visit  Subjective:  Patient ID: Andrea Marquez, female    DOB: [birth year]  Age: 49 y.o. MRN: [id number]  CC:  Chief Complaint  Patient presents with  . ADHD    HPI EDWARDA SAUNDERS presents for   ADD - Reports symptoms are well controlled on current regime. Denies any problems with insomnia, chest pain, palpitations, or SOB.    Hypertension- Pt denies SOB or heart palpitations.  Taking meds as directed w/o problems.  Denies medication side effects.  Actually has had some chest discomfort.  In fact yesterday she had some chest pain.  He also reports that she still struggling with dizziness.  She was diagnosed with vertigo in early November and did 2 sessions of vestibular rehab.  She actually came in a week or 2 later with some left ear pain.  She says the ear pain has been better overall but still gets some intermittent discomfort and sometimes will even use peroxide in the ear.  Most the time it is just itching not hearing loss or significant pain.  F/U depression/Anxiety -she does feel like she has been a lot more anxious she is not sure why.  In fact her daughter who is very supportive is here with her today.  She just feels really ramped up at times.  She has been taking her to hydroxyzine almost twice a day every day but has been running out.  It does seem to help and it does seem to calm her down when she is getting ramped up.  She just feels irritable at times.  She is currently on Effexor 150 mg daily.  Her cold sores have been breaking out more frequently and would like a refill on her antiviral.  C/O  Of occ itching in left ear.    Her daughter is with her today   Past Medical History:  Diagnosis Date  . ADHD   . Anxiety   . Atrial tachycardia ([place])   . Depression   . GERD (gastroesophageal reflux disease)   . Hypertension   . PONV (postoperative nausea and vomiting)     Past Surgical History:  Procedure Laterality Date  . AUGMENTATION  MAMMAPLASTY    . BREAST ENHANCEMENT SURGERY    . LAPAROSCOPY    . NSVD     x2  . OOPHORECTOMY     Single   . TUBAL LIGATION    . VAGINAL HYSTERECTOMY      Family History  Problem Relation Age of Onset  . COPD Father   . Atrial fibrillation Father   . Esophageal cancer Maternal Grandfather   . Colon cancer Neg Hx   . Colon polyps Neg Hx   . Rectal cancer Neg Hx   . Stomach cancer Neg Hx     Social History   Socioeconomic History  . Marital status: Married    Spouse name: Randall Hiss  . Number of children: 2  . Years of education: Not on file  . Highest education level: Not on file  Occupational History  . Not on file  Tobacco Use  . Smoking status: Never Smoker  . Smokeless tobacco: Never Used  Substance and Sexual Activity  . Alcohol use: Yes    Alcohol/week: 0.0 standard drinks    Comment: socially on weekends   . Drug use: No  . Sexual activity: Never  Other Topics Concern  . Not on file  Social History Narrative   Daily caffeine. Works on regularly.  Social Determinants of Health   Financial Resource Strain:   . Difficulty of Paying Living Expenses:   Food Insecurity:   . Worried About Charity fundraiser in the Last Year:   . Arboriculturist in the Last Year:   Transportation Needs:   . Film/video editor (Medical):   Marland Kitchen Lack of Transportation (Non-Medical):   Physical Activity:   . Days of Exercise per Week:   . Minutes of Exercise per Session:   Stress:   . Feeling of Stress :   Social Connections:   . Frequency of Communication with Friends and Family:   . Frequency of Social Gatherings with Friends and Family:   . Attends Religious Services:   . Active Member of Clubs or Organizations:   . Attends Archivist Meetings:   Marland Kitchen Marital Status:   Intimate Partner Violence:   . Fear of Current or Ex-Partner:   . Emotionally Abused:   Marland Kitchen Physically Abused:   . Sexually Abused:     Outpatient Medications Prior to Visit  Medication Sig  Dispense Refill  . acetic acid 2 % otic solution Place 4 drops into the left ear 3 (three) times daily. For ear itching 15 mL 0  . amphetamine-dextroamphetamine (ADDERALL XR) 30 MG 24 hr capsule Take 1 capsule (30 mg total) by mouth every morning. 30 capsule 0  . lisinopril-hydrochlorothiazide (ZESTORETIC) 20-25 MG tablet Take 1 tablet by mouth daily. 90 tablet 1  . metoprolol succinate (TOPROL-XL) 50 MG 24 hr tablet Take 1 tablet (50 mg total) by mouth daily. 90 tablet 1  . venlafaxine XR (EFFEXOR-XR) 150 MG 24 hr capsule TAKE 1 CAPSULE (150 MG TOTAL) BY MOUTH DAILY WITH BREAKFAST. 30 capsule 2  . hydrOXYzine (ATARAX/VISTARIL) 25 MG tablet TAKE 1 TABLET (25 MG TOTAL) BY MOUTH 3 (THREE) TIMES DAILY AS NEEDED FOR ANXIETY. 60 tablet 2   No facility-administered medications prior to visit.    Allergies  Allergen Reactions  . Fluoxetine Other (See Comments)    Sweating, nightmares, insomnia     ROS Review of Systems    Objective:    Physical Exam  Constitutional: She is oriented to person, place, and time. She appears well-developed and well-nourished.  HENT:  Head: Normocephalic and atraumatic.  Right Ear: External ear normal.  Left Ear: External ear normal.  TMs and canals are clear bilaterally.  In fact there is absolutely no wax in either ear.  The base of both ears looks just a little bit irritated.  The tympanic membranes are normal.  Eyes: Conjunctivae are normal.  Cardiovascular: Normal rate, regular rhythm and normal heart sounds.  Pulmonary/Chest: Effort normal and breath sounds normal.  Neurological: She is alert and oriented to person, place, and time.  Skin: Skin is warm and dry.  Psychiatric: She has a normal mood and affect. Her behavior is normal.    BP 108/75   Pulse 77   Ht 5\' 8"  (1.727 m)   Wt 241 lb (109.3 kg)   SpO2 99%   BMI 36.64 kg/m  Wt Readings from Last 3 Encounters:  04/05/19 241 lb (109.3 kg)  12/12/18 232 lb (105.2 kg)  12/04/18 234 lb (106.1  kg)     There are no preventive care reminders to display for this patient.  There are no preventive care reminders to display for this patient.  Lab Results  Component Value Date   TSH 2.06 06/28/2018   Lab Results  Component Value Date  WBC 5.5 06/28/2018   HGB 14.7 06/28/2018   HCT 43.5 06/28/2018   MCV 93.8 06/28/2018   PLT 286 06/28/2018   Lab Results  Component Value Date   NA 141 06/28/2018   K 4.4 06/28/2018   CO2 26 06/28/2018   GLUCOSE 102 (H) 06/28/2018   BUN 21 06/28/2018   CREATININE 0.75 06/28/2018   BILITOT 0.5 06/28/2018   ALKPHOS 49 08/21/2015   AST 24 06/28/2018   ALT 35 (H) 06/28/2018   PROT 7.5 06/28/2018   ALBUMIN 4.6 08/21/2015   CALCIUM 10.0 06/28/2018   Lab Results  Component Value Date   CHOL 223 (H) 06/28/2018   Lab Results  Component Value Date   HDL 62 06/28/2018   Lab Results  Component Value Date   LDLCALC 145 (H) 06/28/2018   Lab Results  Component Value Date   TRIG 68 06/28/2018   Lab Results  Component Value Date   CHOLHDL 3.6 06/28/2018   No results found for: HGBA1C    Assessment & Plan:   Problem List Items Addressed This Visit      Cardiovascular and Mediastinum   Hypertension - Primary    Blood pressure looks great.  In fact it is a little bit on the low side.  I did encourage her to check her blood pressure when she starts to feel lightheaded and/or dizzy.  It sounds like it is more of a vertigo sensation but certainly could be blood pressure related especially if it is going a little on the low side.  She will let me know over the next couple of weeks as it happens.        Digestive   Cold sore    Refilled her valacyclovir today.      Relevant Medications   valACYclovir (VALTREX) 1000 MG tablet     Other   Severe episode of recurrent major depressive disorder, without psychotic features (HCC)   Relevant Medications   venlafaxine XR (EFFEXOR XR) 37.5 MG 24 hr capsule   hydrOXYzine  (ATARAX/VISTARIL) 25 MG tablet   Generalized anxiety disorder   Relevant Medications   venlafaxine XR (EFFEXOR XR) 37.5 MG 24 hr capsule   hydrOXYzine (ATARAX/VISTARIL) 25 MG tablet   Depression with anxiety    Discussed options.  I really encouraged her to get back in with [facility] for therapy who she has seen before.  Also discussed increasing her Effexor. will add an additional 37.5 mg to her and her to her 150 mg tab.       Relevant Medications   venlafaxine XR (EFFEXOR XR) 37.5 MG 24 hr capsule   hydrOXYzine (ATARAX/VISTARIL) 25 MG tablet   Attention deficit hyperactivity disorder (ADHD), combined type    Doing okay on current regimen.  I definitely 1 to be mindful about the ramp-up and anxiety and make sure that the medication is not potentially aggravating this.       Other Visit Diagnoses    Ear itch       Atypical chest pain         Ear itching, left -I feel like she is over cleaning her ears and removing too much wax explained that this is actually protective to the skin within the canal and I really feel like this is causing her itching I do not see any other sign of inflammation.  The eardrums themselves look great.  Atypical chest pain- les likley to be cardiac. Seem stress induced.  Nprmal heart monitor in 07/2018.  Meds ordered this encounter  Medications  . valACYclovir (VALTREX) 1000 MG tablet    Sig: Take 2 tablets (2,000 mg total) by mouth 2 (two) times daily. X 1 day. ( 4 tabs per episdoe)    Dispense:  20 tablet    Refill:  0  . venlafaxine XR (EFFEXOR XR) 37.5 MG 24 hr capsule    Sig: Take 1 capsule (37.5 mg total) by mouth daily with breakfast.    Dispense:  30 capsule    Refill:  1  . hydrOXYzine (ATARAX/VISTARIL) 25 MG tablet    Sig: Take 1 tablet (25 mg total) by mouth 3 (three) times daily as needed for anxiety.    Dispense:  90 tablet    Refill:  2    Follow-up: Return in about 6 weeks (around 05/17/2019) for Anxiety and BP.   Time  spent 40 minutes in encounter and reviewing notes and recent labs.  Beatrice Lecher, MD

## 2019-04-05 NOTE — Assessment & Plan Note (Signed)
Discussed options.  I really encouraged her to get back in with [facility] for therapy who she has seen before.  Also discussed increasing her Effexor. will add an additional 37.5 mg to her and her to her 150 mg tab.

## 2019-04-05 NOTE — Assessment & Plan Note (Signed)
Blood pressure looks great.  In fact it is a little bit on the low side.  I did encourage her to check her blood pressure when she starts to feel lightheaded and/or dizzy.  It sounds like it is more of a vertigo sensation but certainly could be blood pressure related especially if it is going a little on the low side.  She will let me know over the next couple of weeks as it happens.

## 2019-04-05 NOTE — Assessment & Plan Note (Signed)
Doing okay on current regimen.  I definitely 1 to be mindful about the ramp-up and anxiety and make sure that the medication is not potentially aggravating this.

## 2019-04-05 NOTE — Assessment & Plan Note (Signed)
Refilled her valacyclovir today.

## 2019-04-05 NOTE — Patient Instructions (Addendum)
We are going to add 37.5 mg Effexor to your 150 mg Effexor.  You can take them at the same time.  Doing your vertigo exercises again if it is not helping over the next 2 to 3 weeks and please let me know.  Since her blood pressure is a little on the lower side today I am can have you cut your metoprolol in half and take a half a tab daily.  Continue your lisinopril HCTZ with 1 tablet daily.  Do not adjust the dose on that one.

## 2019-04-08 ENCOUNTER — Encounter: Payer: Self-pay | Admitting: Family Medicine

## 2019-05-17 ENCOUNTER — Ambulatory Visit (INDEPENDENT_AMBULATORY_CARE_PROVIDER_SITE_OTHER): Payer: 59 | Admitting: Family Medicine

## 2019-05-17 ENCOUNTER — Encounter: Payer: Self-pay | Admitting: Family Medicine

## 2019-05-17 ENCOUNTER — Other Ambulatory Visit: Payer: Self-pay

## 2019-05-17 VITALS — BP 137/89 | HR 100 | Ht 68.11 in | Wt 245.0 lb

## 2019-05-17 DIAGNOSIS — F332 Major depressive disorder, recurrent severe without psychotic features: Secondary | ICD-10-CM | POA: Diagnosis not present

## 2019-05-17 DIAGNOSIS — J302 Other seasonal allergic rhinitis: Secondary | ICD-10-CM | POA: Diagnosis not present

## 2019-05-17 DIAGNOSIS — I471 Supraventricular tachycardia: Secondary | ICD-10-CM

## 2019-05-17 DIAGNOSIS — F411 Generalized anxiety disorder: Secondary | ICD-10-CM

## 2019-05-17 DIAGNOSIS — I10 Essential (primary) hypertension: Secondary | ICD-10-CM

## 2019-05-17 MED ORDER — BENZONATATE 200 MG PO CAPS: 200.0000 mg | ORAL_CAPSULE | Freq: Three times a day (TID) | ORAL | 0 refills | Status: DC | PRN

## 2019-05-17 MED ORDER — FLUTICASONE PROPIONATE 50 MCG/ACT NA SUSP: 1.0000 | Freq: Every day | NASAL | 3 refills | Status: DC

## 2019-05-17 NOTE — Assessment & Plan Note (Signed)
Recommend a trial of a nasal steroid spray.  Reviewed how to appropriately use the medication.  Prescription sent for fluticasone.

## 2019-05-17 NOTE — Assessment & Plan Note (Signed)
She has been having more palpitation episodes.  We discussed that some of it could be coming from recent cough medications and that she has been taking.  We did discuss that if it persist after she is feeling better that we can consider increasing her metoprolol to 50 mg and decreasing her lisinopril HCT to get better control over the palpitations.  She has had a previous heart monitor.

## 2019-05-17 NOTE — Assessment & Plan Note (Signed)
We increased her Effexor just slightly she has noticed some improvement in mood and irritability but has had a lot of stressful things going on since I last saw her.  Her daughter who is here with her today feels like she is just mentating a little bit more slowly than she was before.  Discussed options.  We will keep an eye on this and plan to see her back again in about 2 months.  We can always discontinue medication if needed.

## 2019-05-17 NOTE — Assessment & Plan Note (Signed)
Blood pressure little elevated today but she is also been off her medication for a couple of days just encouraged her to get back on it.  If she starts having low blood pressures consistently under 110 then let me know and we can adjust her medication.

## 2019-05-17 NOTE — Progress Notes (Signed)
Established Patient Office Visit  Subjective:  Patient ID: Andrea Marquez, female    DOB: 1970-04-27  Age: 49 y.o. MRN: [id number]  CC: No chief complaint on file.   HPI [name] presents for 6-week follow-up  Anxiety with depression-last saw her approximately 6 weeks ago at that time we did increase her Effexor as she was really just juggling with feeling more anxious.  She could not pinpoint any specific stressors but just in general felt very ramped up and irritable.  Her daughter came with her for her last office visit.  Cough and ST from her allergies.  She has been really struggling particularly over the last week.  She has had a lot of persistent cough to the point that it is been keeping her awake at night she did start some over-the-counter generic Allegra.  She in the last couple days of had a little bit more pressure around her face and eyes.  She said some ear pressure and some persistent sore throat as well.  Past Medical History:  Diagnosis Date  . ADHD   . Anxiety   . Atrial tachycardia ([place])   . Depression   . GERD (gastroesophageal reflux disease)   . Hypertension   . PONV (postoperative nausea and vomiting)     Past Surgical History:  Procedure Laterality Date  . AUGMENTATION MAMMAPLASTY    . BREAST ENHANCEMENT SURGERY    . LAPAROSCOPY    . NSVD     x2  . OOPHORECTOMY     Single   . TUBAL LIGATION    . VAGINAL HYSTERECTOMY      Family History  Problem Relation Age of Onset  . COPD Father   . Atrial fibrillation Father   . Esophageal cancer Maternal Grandfather   . Colon cancer Neg Hx   . Colon polyps Neg Hx   . Rectal cancer Neg Hx   . Stomach cancer Neg Hx     Social History   Socioeconomic History  . Marital status: Married    Spouse name: Randall Hiss  . Number of children: 2  . Years of education: Not on file  . Highest education level: Not on file  Occupational History  . Not on file  Tobacco Use  . Smoking status: Never Smoker  .  Smokeless tobacco: Never Used  Substance and Sexual Activity  . Alcohol use: Yes    Alcohol/week: 0.0 standard drinks    Comment: socially on weekends   . Drug use: No  . Sexual activity: Never  Other Topics Concern  . Not on file  Social History Narrative   Daily caffeine. Works on regularly.    Social Determinants of Health   Financial Resource Strain:   . Difficulty of Paying Living Expenses:   Food Insecurity:   . Worried About Charity fundraiser in the Last Year:   . Arboriculturist in the Last Year:   Transportation Needs:   . Film/video editor (Medical):   Marland Kitchen Lack of Transportation (Non-Medical):   Physical Activity:   . Days of Exercise per Week:   . Minutes of Exercise per Session:   Stress:   . Feeling of Stress :   Social Connections:   . Frequency of Communication with Friends and Family:   . Frequency of Social Gatherings with Friends and Family:   . Attends Religious Services:   . Active Member of Clubs or Organizations:   . Attends Archivist Meetings:   .  Marital Status:   Intimate Partner Violence:   . Fear of Current or Ex-Partner:   . Emotionally Abused:   Marland Kitchen Physically Abused:   . Sexually Abused:     Outpatient Medications Prior to Visit  Medication Sig Dispense Refill  . acetic acid 2 % otic solution Place 4 drops into the left ear 3 (three) times daily. For ear itching 15 mL 0  . amphetamine-dextroamphetamine (ADDERALL XR) 30 MG 24 hr capsule Take 1 capsule (30 mg total) by mouth every morning. 30 capsule 0  . hydrOXYzine (ATARAX/VISTARIL) 25 MG tablet Take 1 tablet (25 mg total) by mouth 3 (three) times daily as needed for anxiety. 90 tablet 2  . lisinopril-hydrochlorothiazide (ZESTORETIC) 20-25 MG tablet Take 1 tablet by mouth daily. 90 tablet 1  . metoprolol succinate (TOPROL-XL) 50 MG 24 hr tablet Take 1 tablet (50 mg total) by mouth daily. 90 tablet 1  . valACYclovir (VALTREX) 1000 MG tablet Take 2 tablets (2,000 mg total) by  mouth 2 (two) times daily. X 1 day. ( 4 tabs per episdoe) 20 tablet 0  . venlafaxine XR (EFFEXOR XR) 37.5 MG 24 hr capsule Take 1 capsule (37.5 mg total) by mouth daily with breakfast. 30 capsule 1  . venlafaxine XR (EFFEXOR-XR) 150 MG 24 hr capsule TAKE 1 CAPSULE (150 MG TOTAL) BY MOUTH DAILY WITH BREAKFAST. 30 capsule 2   No facility-administered medications prior to visit.    Allergies  Allergen Reactions  . Fluoxetine Other (See Comments)    Sweating, nightmares, insomnia     ROS Review of Systems    Objective:    Physical Exam  Constitutional: She is oriented to person, place, and time. She appears well-developed and well-nourished.  HENT:  Head: Normocephalic and atraumatic.  Cardiovascular: Normal rate, regular rhythm and normal heart sounds.  Pulmonary/Chest: Effort normal and breath sounds normal.  Neurological: She is alert and oriented to person, place, and time.  Skin: Skin is warm and dry.  Psychiatric: She has a normal mood and affect. Her behavior is normal.    BP 137/89 (BP Location: Left Arm, Patient Position: Sitting, Cuff Size: Large)   Pulse 100   Ht 5' 8.11" (1.73 m)   Wt 245 lb (111.1 kg)   SpO2 100%   BMI 37.13 kg/m  Wt Readings from Last 3 Encounters:  05/17/19 245 lb (111.1 kg)  04/05/19 241 lb (109.3 kg)  12/12/18 232 lb (105.2 kg)     Health Maintenance Due  Topic Date Due  . COVID-19 Vaccine (1) Never done    There are no preventive care reminders to display for this patient.  Lab Results  Component Value Date   TSH 2.06 06/28/2018   Lab Results  Component Value Date   WBC 5.5 06/28/2018   HGB 14.7 06/28/2018   HCT 43.5 06/28/2018   MCV 93.8 06/28/2018   PLT 286 06/28/2018   Lab Results  Component Value Date   NA 141 06/28/2018   K 4.4 06/28/2018   CO2 26 06/28/2018   GLUCOSE 102 (H) 06/28/2018   BUN 21 06/28/2018   CREATININE 0.75 06/28/2018   BILITOT 0.5 06/28/2018   ALKPHOS 49 08/21/2015   AST 24 06/28/2018   ALT  35 (H) 06/28/2018   PROT 7.5 06/28/2018   ALBUMIN 4.6 08/21/2015   CALCIUM 10.0 06/28/2018   Lab Results  Component Value Date   CHOL 223 (H) 06/28/2018   Lab Results  Component Value Date   HDL 62 06/28/2018  Lab Results  Component Value Date   LDLCALC 145 (H) 06/28/2018   Lab Results  Component Value Date   TRIG 68 06/28/2018   Lab Results  Component Value Date   CHOLHDL 3.6 06/28/2018   No results found for: HGBA1C    Assessment & Plan:   Problem List Items Addressed This Visit      Cardiovascular and Mediastinum   Hypertension - Primary    Blood pressure little elevated today but she is also been off her medication for a couple of days just encouraged her to get back on it.  If she starts having low blood pressures consistently under 110 then let me know and we can adjust her medication.      Atrial tachycardia ([place])    She has been having more palpitation episodes.  We discussed that some of it could be coming from recent cough medications and that she has been taking.  We did discuss that if it persist after she is feeling better that we can consider increasing her metoprolol to 50 mg and decreasing her lisinopril HCT to get better control over the palpitations.  She has had a previous heart monitor.        Other   Severe episode of recurrent major depressive disorder, without psychotic features ([place])   Seasonal allergies    Recommend a trial of a nasal steroid spray.  Reviewed how to appropriately use the medication.  Prescription sent for fluticasone.      Generalized anxiety disorder    We increased her Effexor just slightly she has noticed some improvement in mood and irritability but has had a lot of stressful things going on since I last saw her.  Her daughter who is here with her today feels like she is just mentating a little bit more slowly than she was before.  Discussed options.  We will keep an eye on this and plan to see her back again in about 2  months.  We can always discontinue medication if needed.         Meds ordered this encounter  Medications  . benzonatate (TESSALON) 200 MG capsule    Sig: Take 1 capsule (200 mg total) by mouth 3 (three) times daily as needed for cough.    Dispense:  30 capsule    Refill:  0  . fluticasone (FLONASE) 50 MCG/ACT nasal spray    Sig: Place 1-2 sprays into both nostrils daily.    Dispense:  16 g    Refill:  3    Follow-up: Return in about 2 months (around 07/17/2019), or if symptoms worsen or fail to improve, for Follow up on mood medication  .    Beatrice Lecher, MD

## 2019-06-05 ENCOUNTER — Encounter (INDEPENDENT_AMBULATORY_CARE_PROVIDER_SITE_OTHER): Payer: Self-pay | Admitting: Family Medicine

## 2019-06-05 ENCOUNTER — Other Ambulatory Visit: Payer: Self-pay

## 2019-06-05 ENCOUNTER — Ambulatory Visit (INDEPENDENT_AMBULATORY_CARE_PROVIDER_SITE_OTHER): Payer: 59 | Admitting: Family Medicine

## 2019-06-05 VITALS — BP 115/73 | HR 63 | Temp 97.9°F | Ht 67.0 in | Wt 237.0 lb

## 2019-06-05 DIAGNOSIS — Z9189 Other specified personal risk factors, not elsewhere classified: Secondary | ICD-10-CM

## 2019-06-05 DIAGNOSIS — R7303 Prediabetes: Secondary | ICD-10-CM | POA: Diagnosis not present

## 2019-06-05 DIAGNOSIS — F418 Other specified anxiety disorders: Secondary | ICD-10-CM | POA: Diagnosis not present

## 2019-06-05 DIAGNOSIS — Z6837 Body mass index (BMI) 37.0-37.9, adult: Secondary | ICD-10-CM

## 2019-06-05 DIAGNOSIS — I10 Essential (primary) hypertension: Secondary | ICD-10-CM | POA: Diagnosis not present

## 2019-06-05 DIAGNOSIS — R5383 Other fatigue: Secondary | ICD-10-CM | POA: Diagnosis not present

## 2019-06-05 DIAGNOSIS — E7849 Other hyperlipidemia: Secondary | ICD-10-CM

## 2019-06-05 DIAGNOSIS — R0602 Shortness of breath: Secondary | ICD-10-CM

## 2019-06-05 DIAGNOSIS — Z0289 Encounter for other administrative examinations: Secondary | ICD-10-CM

## 2019-06-05 DIAGNOSIS — Z1331 Encounter for screening for depression: Secondary | ICD-10-CM | POA: Diagnosis not present

## 2019-06-06 LAB — LIPID PANEL WITH LDL/HDL RATIO
Cholesterol, Total: 234 mg/dL — ABNORMAL HIGH (ref 100–199)
HDL: 54 mg/dL (ref >39)
LDL Chol Calc (NIH): 157 mg/dL — ABNORMAL HIGH (ref 0–99)
LDL/HDL Ratio: 2.9 ratio (ref 0.0–3.2)
Triglycerides: 127 mg/dL (ref 0–149)
VLDL Cholesterol Cal: 23 mg/dL (ref 5–40)

## 2019-06-06 LAB — CBC WITH DIFFERENTIAL/PLATELET
Basophils Absolute: 0.1 10*3/uL (ref 0.0–0.2)
Basos: 1 %
EOS (ABSOLUTE): 0.1 10*3/uL (ref 0.0–0.4)
Eos: 2 %
Hematocrit: 46.2 % (ref 34.0–46.6)
Hemoglobin: 15.4 g/dL (ref 11.1–15.9)
Immature Grans (Abs): 0 10*3/uL (ref 0.0–0.1)
Immature Granulocytes: 0 %
Lymphocytes Absolute: 1.9 10*3/uL (ref 0.7–3.1)
Lymphs: 26 %
MCH: 32.2 pg (ref 26.6–33.0)
MCHC: 33.3 g/dL (ref 31.5–35.7)
MCV: 97 fL (ref 79–97)
Monocytes Absolute: 0.6 10*3/uL (ref 0.1–0.9)
Monocytes: 9 %
Neutrophils Absolute: 4.6 10*3/uL (ref 1.4–7.0)
Neutrophils: 62 %
Platelets: 289 10*3/uL (ref 150–450)
RBC: 4.78 x10E6/uL (ref 3.77–5.28)
RDW: 12.2 % (ref 11.7–15.4)
WBC: 7.3 10*3/uL (ref 3.4–10.8)

## 2019-06-06 LAB — TSH: TSH: 2.54 u[IU]/mL (ref 0.450–4.500)

## 2019-06-06 LAB — COMPREHENSIVE METABOLIC PANEL
ALT: 22 IU/L (ref 0–32)
AST: 17 IU/L (ref 0–40)
Albumin/Globulin Ratio: 1.8 (ref 1.2–2.2)
Albumin: 4.8 g/dL (ref 3.8–4.8)
Alkaline Phosphatase: 74 IU/L (ref 39–117)
BUN/Creatinine Ratio: 25 — ABNORMAL HIGH (ref 9–23)
BUN: 17 mg/dL (ref 6–24)
Bilirubin Total: 0.2 mg/dL (ref 0.0–1.2)
CO2: 26 mmol/L (ref 20–29)
Calcium: 9.8 mg/dL (ref 8.7–10.2)
Chloride: 98 mmol/L (ref 96–106)
Creatinine, Ser: 0.67 mg/dL (ref 0.57–1.00)
GFR calc Af Amer: 119 mL/min/{1.73_m2} (ref >59)
GFR calc non Af Amer: 103 mL/min/{1.73_m2} (ref >59)
Globulin, Total: 2.6 g/dL (ref 1.5–4.5)
Glucose: 95 mg/dL (ref 65–99)
Potassium: 4.6 mmol/L (ref 3.5–5.2)
Sodium: 139 mmol/L (ref 134–144)
Total Protein: 7.4 g/dL (ref 6.0–8.5)

## 2019-06-06 LAB — VITAMIN D 25 HYDROXY (VIT D DEFICIENCY, FRACTURES): Vit D, 25-Hydroxy: 38.1 ng/mL (ref 30.0–100.0)

## 2019-06-06 LAB — T3: T3, Total: 97 ng/dL (ref 71–180)

## 2019-06-06 LAB — T4, FREE: Free T4: 0.86 ng/dL (ref 0.82–1.77)

## 2019-06-06 LAB — VITAMIN B12: Vitamin B-12: 643 pg/mL (ref 232–1245)

## 2019-06-06 LAB — HEMOGLOBIN A1C
Est. average glucose Bld gHb Est-mCnc: 111 mg/dL
Hgb A1c MFr Bld: 5.5 % (ref 4.8–5.6)

## 2019-06-06 LAB — FOLATE: Folate: 7.8 ng/mL (ref >3.0)

## 2019-06-06 LAB — INSULIN, RANDOM: INSULIN: 11.8 u[IU]/mL (ref 2.6–24.9)

## 2019-06-06 NOTE — Progress Notes (Signed)
Chief Complaint:   OBESITY Andrea Marquez (MR# [id number]) is a 49 y.o. female who presents for evaluation and treatment of obesity and related comorbidities. Current BMI is Body mass index is 37.12 kg/m. Andrea Marquez has been struggling with her weight for many years and has been unsuccessful in either losing weight, maintaining weight loss, or reaching her healthy weight goal.  Andrea Marquez is currently in the action stage of change and ready to dedicate time achieving and maintaining a healthier weight. Andrea Marquez is interested in becoming our patient and working on intensive lifestyle modifications including (but not limited to) diet and exercise for weight loss.  Andrea Marquez's habits were reviewed today and are as follows: Her family eats meals together, she thinks her family will eat healthier with her, she struggles with family and or coworkers weight loss sabotage, her desired weight loss is 97 lbs, she started gaining weight in 2014, her heaviest weight ever was 242 pounds, she has significant food cravings issues, she snacks frequently in the evenings, she skips meals frequently, she is frequently drinking liquids with calories, she frequently makes poor food choices, she has problems with excessive hunger, she frequently eats larger portions than normal and she struggles with emotional eating.  Depression Screen Andrea Marquez's Food and Mood (modified PHQ-9) score was 24.  Depression screen PHQ 2/9 06/05/2019  Decreased Interest 3  Down, Depressed, Hopeless 3  PHQ - 2 Score 6  Altered sleeping 3  Tired, decreased energy 3  Change in appetite 3  Feeling bad or failure about yourself  3  Trouble concentrating 3  Moving slowly or fidgety/restless 3  Suicidal thoughts 0  PHQ-9 Score 24  Difficult doing work/chores Not difficult at all   Subjective:   1. Other fatigue Andrea Marquez admits to daytime somnolence and admits to waking up still tired. Patent has a history of symptoms of daytime fatigue and  morning headache. Andrea Marquez generally gets 5 hours of sleep per night, and states that she has nightime awakenings. Snoring is present. Apneic episodes are present. Epworth Sleepiness Score is 18.  2. Shortness of breath on exertion Andrea Marquez notes increasing shortness of breath with exercising and seems to be worsening over time with weight gain. She notes getting out of breath sooner with activity than she used to. This has not gotten worse recently. Andrea Marquez denies shortness of breath at rest or orthopnea.  3. Pre-diabetes Andrea Marquez has elevated BGs in Epic system. She is not on metformin.  4. Essential hypertension Andrea Marquez's blood pressure is well controlled at her office visit today. She is on lisinopril-hydrochlorothiazide 20-25 mg q daily and metoprolol succinate 50 mg q daily.  5. Other hyperlipidemia Andrea Marquez's lipid panel on 06/28/2018, showed a total cholesterol of 223, triglycerides 68, HDL 62, and LDL 145. She is not on statin therapy.  6. Depression with anxiety Andrea Marquez's PHQ-9 score is 24. She is currently on venlafaxine XR.  7. At risk for heart disease Andrea Marquez is at a higher than average risk for cardiovascular disease due to obesity, hypertension, and hyperlipidemia.   Assessment/Plan:   1. Other fatigue Andrea Marquez does feel that her weight is causing her energy to be lower than it should be. Fatigue may be related to obesity, depression or many other causes. Labs will be ordered, and in the meanwhile, Andrea Marquez will focus on self care including making healthy food choices, increasing physical activity and focusing on stress reduction.  - EKG 12-Lead - CBC with Differential/Platelet - Comprehensive metabolic panel - VITAMIN D  25 Hydroxy (Vit-D Deficiency, Fractures) - Vitamin B12 - Folate - T3 - T4, free - TSH  2. Shortness of breath on exertion Andrea Marquez does feel that she gets out of breath more easily that she used to when she exercises. Andrea Marquez's shortness of breath appears to be obesity  related and exercise induced. She has agreed to work on weight loss and gradually increase exercise to treat her exercise induced shortness of breath. Will continue to monitor closely.  3. Pre-diabetes Andrea Marquez will start Category 3 meal plan, and will continue to work on weight loss, exercise, and decreasing simple carbohydrates to help decrease the risk of diabetes. We will check labs today.  - Hemoglobin A1c - Insulin, random  4. Essential hypertension Andrea Marquez will start her Category 3 meal plan, and will continue her current anti-hypertensive therapy. She will continue working on healthy weight loss and exercise to improve blood pressure control. We will watch for signs of hypotension as she continues her lifestyle modifications. We will check labs today.  - CBC with Differential/Platelet - Comprehensive metabolic panel  5. Other hyperlipidemia Cardiovascular risk and specific lipid/LDL goals reviewed. We discussed several lifestyle modifications today. Andrea Marquez will start her Category 3 meal plan, and will continue to work on exercise and weight loss efforts. We will check labs today. Orders and follow up as documented in patient record.   - Comprehensive metabolic panel - Lipid Panel With LDL/HDL Ratio  6. Depression with anxiety Behavior modification techniques were discussed today to help Andrea Marquez deal with her emotional/non-hunger eating behaviors. Andrea Marquez will continue her current SSRI regimen. Orders and follow up as documented in patient record.   7. At risk for heart disease Andrea Marquez was given approximately 30 minutes of coronary artery disease prevention counseling today. She is 49 y.o. female and has risk factors for heart disease including obesity, hypertension, and hyperlipidemia. We discussed intensive lifestyle modifications today with an emphasis on specific weight loss instructions and strategies.   Repetitive spaced learning was employed today to elicit superior memory formation  and behavioral change.  8. Class 2 severe obesity with serious comorbidity and body mass index (BMI) of 37.0 to 37.9 in adult, unspecified obesity type (HCC) Andrea Marquez is currently in the action stage of change and her goal is to continue with weight loss efforts. I recommend Andrea Marquez begin the structured treatment plan as follows:  She has agreed to the Category 3 Plan.  Exercise goals: No exercise has been prescribed for now, while we concentrate on nutritional changes.   Behavioral modification strategies: increasing lean protein intake, decreasing simple carbohydrates, decreasing eating out, no skipping meals and meal planning and cooking strategies.  She was informed of the importance of frequent follow-up visits to maximize her success with intensive lifestyle modifications for her multiple health conditions. She was informed we would discuss her lab results at her next visit unless there is a critical issue that needs to be addressed sooner. Andrea Marquez agreed to keep her next visit at the agreed upon time to discuss these results.  Objective:   Blood pressure 115/73, pulse 63, temperature 97.9 F (36.6 C), temperature source Oral, height 5\' 7"  (1.702 m), weight 237 lb (107.5 kg), SpO2 98 %. Body mass index is 37.12 kg/m.  EKG: Normal sinus rhythm, rate 71 BPM.  Indirect Calorimeter completed today shows a VO2 of 290 and a REE of 2019.  Her calculated basal metabolic rate is [id number] thus her basal metabolic rate is better than expected.  General: Cooperative, alert, well  developed, in no acute distress. HEENT: Conjunctivae and lids unremarkable. Cardiovascular: Regular rhythm.  Lungs: Normal work of breathing. Neurologic: No focal deficits.   Lab Results  Component Value Date   CREATININE 0.67 06/05/2019   BUN 17 06/05/2019   NA 139 06/05/2019   K 4.6 06/05/2019   CL 98 06/05/2019   CO2 26 06/05/2019   Lab Results  Component Value Date   ALT 22 06/05/2019   AST 17 06/05/2019    ALKPHOS 74 06/05/2019   BILITOT 0.2 06/05/2019   Lab Results  Component Value Date   HGBA1C 5.5 06/05/2019   Lab Results  Component Value Date   INSULIN 11.8 06/05/2019   Lab Results  Component Value Date   TSH 2.540 06/05/2019   Lab Results  Component Value Date   CHOL 234 (H) 06/05/2019   HDL 54 06/05/2019   LDLCALC 157 (H) 06/05/2019   TRIG 127 06/05/2019   CHOLHDL 3.6 06/28/2018   Lab Results  Component Value Date   WBC 7.3 06/05/2019   HGB 15.4 06/05/2019   HCT 46.2 06/05/2019   MCV 97 06/05/2019   PLT 289 06/05/2019   No results found for: IRON, TIBC, FERRITIN  Attestation Statements:   Reviewed by clinician on day of visit: allergies, medications, problem list, medical history, surgical history, family history, social history, and previous encounter notes.   I, Trixie Dredge, am acting as transcriptionist for Dennard Nip, MD.  I have reviewed the above documentation for accuracy and completeness, and I agree with the above. - Dennard Nip, MD

## 2019-06-19 ENCOUNTER — Ambulatory Visit (INDEPENDENT_AMBULATORY_CARE_PROVIDER_SITE_OTHER): Payer: 59 | Admitting: Family Medicine

## 2019-06-19 ENCOUNTER — Other Ambulatory Visit: Payer: Self-pay | Admitting: Family Medicine

## 2019-06-19 ENCOUNTER — Other Ambulatory Visit: Payer: Self-pay

## 2019-06-19 ENCOUNTER — Encounter (INDEPENDENT_AMBULATORY_CARE_PROVIDER_SITE_OTHER): Payer: Self-pay | Admitting: Family Medicine

## 2019-06-19 VITALS — BP 113/77 | HR 63 | Temp 98.3°F | Ht 67.0 in | Wt 239.0 lb

## 2019-06-19 DIAGNOSIS — Z6837 Body mass index (BMI) 37.0-37.9, adult: Secondary | ICD-10-CM

## 2019-06-19 DIAGNOSIS — I10 Essential (primary) hypertension: Secondary | ICD-10-CM

## 2019-06-19 DIAGNOSIS — E8881 Metabolic syndrome: Secondary | ICD-10-CM | POA: Diagnosis not present

## 2019-06-19 DIAGNOSIS — E7849 Other hyperlipidemia: Secondary | ICD-10-CM | POA: Diagnosis not present

## 2019-06-19 DIAGNOSIS — Z1231 Encounter for screening mammogram for malignant neoplasm of breast: Secondary | ICD-10-CM

## 2019-06-19 DIAGNOSIS — E559 Vitamin D deficiency, unspecified: Secondary | ICD-10-CM

## 2019-06-19 DIAGNOSIS — Z9189 Other specified personal risk factors, not elsewhere classified: Secondary | ICD-10-CM

## 2019-06-19 DIAGNOSIS — E66812 Obesity, class 2: Secondary | ICD-10-CM

## 2019-06-19 DIAGNOSIS — B001 Herpesviral vesicular dermatitis: Secondary | ICD-10-CM

## 2019-06-19 DIAGNOSIS — E88819 Insulin resistance, unspecified: Secondary | ICD-10-CM

## 2019-06-19 MED ORDER — AMPHETAMINE-DEXTROAMPHET ER 30 MG PO CP24: 30.0000 mg | ORAL_CAPSULE | ORAL | 0 refills | Status: DC

## 2019-06-20 ENCOUNTER — Encounter: Payer: Self-pay | Admitting: Family Medicine

## 2019-06-20 NOTE — Progress Notes (Signed)
Chief Complaint:   OBESITY Andrea Marquez is here to discuss her progress with her obesity treatment plan along with follow-up of her obesity related diagnoses. Andrea Marquez is on the Category 3 Plan and states she is following her eating plan approximately 50% of the time. [name] states she is doing 0 minutes 0 times per week.  Today's visit was #: 2 Starting weight: 237 lbs Starting date: 06/05/2019 Today's weight: 239 lbs Today's date: 06/19/2019 Total lbs lost to date: 0 Total lbs lost since last in-office visit: 0  Interim History: [name] struggled with meal prepping and over snacking at night time. She preferred to prepare meals off the plan.  Subjective:   1. Other hyperlipidemia Andrea Marquez's lipid panel on 06/05/2019, showed a total cholesterol of 234, triglycerides 127, HDL 54, and LDL 157. She is not on statin therapy. I discussed labs with the patient today.  2. Vitamin D deficiency Andrea Marquez's Vit D level on 06/05/2019 was 38.1. She just started taking OTC Vit D3 5,000 IU q daily and is tolerating it well. I discussed labs with the patient today.  3. Insulin resistance Andrea Marquez's insulin level on 06/05/2019 was 11.8, blood glucose 95, and A1c 5.5. She is not on metformin. I discussed labs with the patient today.  4. Essential hypertension Andrea Marquez has soft blood pressure at today's office visit. She reports increase in fatigue the last few weeks. She will experience intermitted dizziness with position changes. She is on metoprolol XL 50 mg q daily and lisinopril-hydrochlorothiazide 20-25 mg q daily.  5. At risk for complication associated with hypotension The patient is at a higher than average risk of hypotension due to being on multiple anti-hypertensives, soft blood pressure today, and a history of intermittent dizziness with position changes.  Assessment/Plan:   1. Other hyperlipidemia Cardiovascular risk and specific lipid/LDL goals reviewed. We discussed several lifestyle modifications  today and Madalen will continue to work on diet, exercise and weight loss efforts. We will recheck labs in 3 months. Orders and follow up as documented in patient record.   2. Vitamin D deficiency Low Vitamin D level contributes to fatigue and are associated with obesity, breast, and colon cancer. Pebbles agreed to continue her current OTC Vit D supplementation and will follow-up for routine testing of Vitamin D, at least 2-3 times per year to avoid over-replacement. We will recheck labs in 3 months.  3. Insulin resistance Symphany will change to journaling, and will continue to work on weight loss, exercise, and decreasing simple carbohydrates to help decrease the risk of diabetes. We will recheck labs in 3 months. [name] agreed to follow-up with [place] as directed to closely monitor her progress.  4. Essential hypertension Andrea Marquez is working on healthy weight loss and exercise to improve blood pressure control. We will watch for signs of hypotension as she continues her lifestyle modifications.  5. At risk for complication associated with hypotension Andrea Marquez was given approximately 30 minutes of education and counseling today to help avoid hypotension. We discussed risks of hypotension with weight loss and signs of hypotension such as feeling lightheaded or unsteady.  Repetitive spaced learning was employed today to elicit superior memory formation and behavioral change.  6. Class 2 severe obesity with serious comorbidity and body mass index (BMI) of 37.0 to 37.9 in adult, unspecified obesity type (HCC) Andrea Marquez is currently in the action stage of change. As such, her goal is to continue with weight loss efforts. She has agreed to keeping a food journal and adhering  to recommended goals of 1200-1400 calories and 85+ grams of protein daily.   Andrea Marquez was changed to journaling. Handout provided today: Insulin Resistance and Pre-diabetes.  Exercise goals: No exercise has been prescribed at this  time.  Behavioral modification strategies: increasing water intake, decreasing liquid calories, no skipping meals, meal planning and cooking strategies and ways to avoid night time snacking.  Andrea Marquez has agreed to follow-up with our clinic in 2 weeks. She was informed of the importance of frequent follow-up visits to maximize her success with intensive lifestyle modifications for her multiple health conditions.   Objective:   Blood pressure 113/77, pulse 63, temperature 98.3 F (36.8 C), temperature source Oral, height 5\' 7"  (1.702 m), weight 239 lb (108.4 kg), SpO2 97 %. Body mass index is 37.43 kg/m.  General: Cooperative, alert, well developed, in no acute distress. HEENT: Conjunctivae and lids unremarkable. Cardiovascular: Regular rhythm.  Lungs: Normal work of breathing. Neurologic: No focal deficits.   Lab Results  Component Value Date   CREATININE 0.67 06/05/2019   BUN 17 06/05/2019   NA 139 06/05/2019   K 4.6 06/05/2019   CL 98 06/05/2019   CO2 26 06/05/2019   Lab Results  Component Value Date   ALT 22 06/05/2019   AST 17 06/05/2019   ALKPHOS 74 06/05/2019   BILITOT 0.2 06/05/2019   Lab Results  Component Value Date   HGBA1C 5.5 06/05/2019   Lab Results  Component Value Date   INSULIN 11.8 06/05/2019   Lab Results  Component Value Date   TSH 2.540 06/05/2019   Lab Results  Component Value Date   CHOL 234 (H) 06/05/2019   HDL 54 06/05/2019   LDLCALC 157 (H) 06/05/2019   TRIG 127 06/05/2019   CHOLHDL 3.6 06/28/2018   Lab Results  Component Value Date   WBC 7.3 06/05/2019   HGB 15.4 06/05/2019   HCT 46.2 06/05/2019   MCV 97 06/05/2019   PLT 289 06/05/2019   No results found for: IRON, TIBC, FERRITIN  Attestation Statements:   Reviewed by clinician on day of visit: allergies, medications, problem list, medical history, surgical history, family history, social history, and previous encounter notes.   I, Trixie Dredge, am acting as  transcriptionist for Dennard Nip, MD.  I have reviewed the above documentation for accuracy and completeness, and I agree with the above. -  Dennard Nip, MD

## 2019-06-21 ENCOUNTER — Encounter: Payer: Self-pay | Admitting: Family Medicine

## 2019-06-21 ENCOUNTER — Ambulatory Visit (INDEPENDENT_AMBULATORY_CARE_PROVIDER_SITE_OTHER): Payer: 59 | Admitting: Family Medicine

## 2019-06-21 ENCOUNTER — Other Ambulatory Visit: Payer: Self-pay

## 2019-06-21 VITALS — BP 110/65 | HR 62 | Ht 67.0 in | Wt 243.0 lb

## 2019-06-21 DIAGNOSIS — I959 Hypotension, unspecified: Secondary | ICD-10-CM

## 2019-06-21 DIAGNOSIS — M7711 Lateral epicondylitis, right elbow: Secondary | ICD-10-CM

## 2019-06-21 DIAGNOSIS — R42 Dizziness and giddiness: Secondary | ICD-10-CM | POA: Diagnosis not present

## 2019-06-21 DIAGNOSIS — F902 Attention-deficit hyperactivity disorder, combined type: Secondary | ICD-10-CM

## 2019-06-21 MED ORDER — AMPHETAMINE-DEXTROAMPHET ER 30 MG PO CP24: 30.0000 mg | ORAL_CAPSULE | ORAL | 0 refills | Status: DC

## 2019-06-21 MED ORDER — AMPHETAMINE-DEXTROAMPHET ER 30 MG PO CP24
30.0000 mg | ORAL_CAPSULE | ORAL | 0 refills | Status: DC
Start: 2019-08-17 — End: 2019-11-30

## 2019-06-21 MED ORDER — METOPROLOL SUCCINATE ER 25 MG PO TB24: 25.0000 mg | ORAL_TABLET | Freq: Every day | ORAL | 1 refills | Status: DC

## 2019-06-21 NOTE — Progress Notes (Signed)
Established Patient Office Visit  Subjective:  Patient ID: Andrea Marquez, female    DOB: 1970/07/31  Age: 49 y.o. MRN: [id number]  CC:  Chief Complaint  Patient presents with  . Blood Pressure Check    HPI Andrea Marquez presents for low blood pressure she says it been happening intermittently and will feel little lightheaded and dizzy with it.  In fact her blood pressure was a little bit low when I actually saw her in March and I encouraged her to check it at home to see if it was actually dropping low at home as well.  Is currently on lisinopril HCTZ as well as 50 mg of metoprolol daily.  We did do orthostatics today.  She did feel little dizzy when we went from lying to sitting.  She has been taking her blood pressure medications regularly.  Vitamin D 5000 IU daily.     Past Medical History:  Diagnosis Date  . ADHD   . Anxiety   . Atrial tachycardia ([place])   . Back pain   . Depression    severe  . Female infertility   . GERD (gastroesophageal reflux disease)   . Hyperlipidemia   . Hypertension   . Obesity   . Palpitations   . PONV (postoperative nausea and vomiting)   . Prediabetes   . Severe anxiety   . Vertigo     Past Surgical History:  Procedure Laterality Date  . AUGMENTATION MAMMAPLASTY    . BREAST ENHANCEMENT SURGERY    . LAPAROSCOPY    . NSVD     x2  . OOPHORECTOMY     Single   . TUBAL LIGATION    . VAGINAL HYSTERECTOMY      Family History  Problem Relation Age of Onset  . COPD Father   . Atrial fibrillation Father   . Diabetes Father   . Hypertension Father   . Depression Father   . Anxiety disorder Father   . Obesity Father   . Esophageal cancer Maternal Grandfather   . Hypertension Mother   . Skin cancer Mother   . Depression Mother   . Anxiety disorder Mother   . Bipolar disorder Mother   . Drug abuse Mother   . Obesity Mother   . Colon cancer Neg Hx   . Colon polyps Neg Hx   . Rectal cancer Neg Hx   . Stomach cancer Neg Hx      Social History   Socioeconomic History  . Marital status: Married    Spouse name: Randall Hiss  . Number of children: 2  . Years of education: Not on file  . Highest education level: Not on file  Occupational History  . Occupation: Work from [employer]  . Smoking status: Never Smoker  . Smokeless tobacco: Never Used  Substance and Sexual Activity  . Alcohol use: Yes    Alcohol/week: 0.0 standard drinks    Comment: socially on weekends   . Drug use: No  . Sexual activity: Never  Other Topics Concern  . Not on file  Social History Narrative   Daily caffeine. Works on regularly.    Social Determinants of Health   Financial Resource Strain:   . Difficulty of Paying Living Expenses:   Food Insecurity:   . Worried About Charity fundraiser in the Last Year:   . Arboriculturist in the Last Year:   Transportation Needs:   . Film/video editor (Medical):   Marland Kitchen  Lack of Transportation (Non-Medical):   Physical Activity:   . Days of Exercise per Week:   . Minutes of Exercise per Session:   Stress:   . Feeling of Stress :   Social Connections:   . Frequency of Communication with Friends and Family:   . Frequency of Social Gatherings with Friends and Family:   . Attends Religious Services:   . Active Member of Clubs or Organizations:   . Attends Archivist Meetings:   Marland Kitchen Marital Status:   Intimate Partner Violence:   . Fear of Current or Ex-Partner:   . Emotionally Abused:   Marland Kitchen Physically Abused:   . Sexually Abused:     Outpatient Medications Prior to Visit  Medication Sig Dispense Refill  . hydrOXYzine (ATARAX/VISTARIL) 25 MG tablet Take 1 tablet (25 mg total) by mouth 3 (three) times daily as needed for anxiety. 90 tablet 2  . lisinopril-hydrochlorothiazide (ZESTORETIC) 20-25 MG tablet Take 1 tablet by mouth daily. 90 tablet 1  . venlafaxine XR (EFFEXOR XR) 37.5 MG 24 hr capsule Take 1 capsule (37.5 mg total) by mouth daily with breakfast. 30 capsule 1  .  venlafaxine XR (EFFEXOR-XR) 150 MG 24 hr capsule TAKE 1 CAPSULE (150 MG TOTAL) BY MOUTH DAILY WITH BREAKFAST. 30 capsule 2  . amphetamine-dextroamphetamine (ADDERALL XR) 30 MG 24 hr capsule Take 1 capsule (30 mg total) by mouth every morning. 30 capsule 0  . metoprolol succinate (TOPROL-XL) 50 MG 24 hr tablet TAKE 1 TABLET BY MOUTH EVERY DAY 90 tablet 1   No facility-administered medications prior to visit.    Allergies  Allergen Reactions  . Fluoxetine Other (See Comments)    Sweating, nightmares, insomnia     ROS Review of Systems    Objective:    Physical Exam  Constitutional: She is oriented to person, place, and time. She appears well-developed and well-nourished.  HENT:  Head: Normocephalic and atraumatic.  Cardiovascular: Normal rate, regular rhythm and normal heart sounds.  Pulmonary/Chest: Effort normal and breath sounds normal.  Musculoskeletal:     Comments: Tender over the lateral epicondyle.  Pain with pronation against resistance.    Neurological: She is alert and oriented to person, place, and time.  Skin: Skin is warm and dry.  Psychiatric: She has a normal mood and affect. Her behavior is normal.    BP 110/65   Pulse 62   Ht 5\' 7"  (1.702 m)   Wt 243 lb (110.2 kg)   SpO2 98%   BMI 38.06 kg/m  Wt Readings from Last 3 Encounters:  06/21/19 243 lb (110.2 kg)  06/19/19 239 lb (108.4 kg)  06/05/19 237 lb (107.5 kg)     There are no preventive care reminders to display for this patient.  There are no preventive care reminders to display for this patient.  Lab Results  Component Value Date   TSH 2.540 06/05/2019   Lab Results  Component Value Date   WBC 7.3 06/05/2019   HGB 15.4 06/05/2019   HCT 46.2 06/05/2019   MCV 97 06/05/2019   PLT 289 06/05/2019   Lab Results  Component Value Date   NA 139 06/05/2019   K 4.6 06/05/2019   CO2 26 06/05/2019   GLUCOSE 95 06/05/2019   BUN 17 06/05/2019   CREATININE 0.67 06/05/2019   BILITOT 0.2  06/05/2019   ALKPHOS 74 06/05/2019   AST 17 06/05/2019   ALT 22 06/05/2019   PROT 7.4 06/05/2019   ALBUMIN 4.8 06/05/2019  CALCIUM 9.8 06/05/2019   Lab Results  Component Value Date   CHOL 234 (H) 06/05/2019   Lab Results  Component Value Date   HDL 54 06/05/2019   Lab Results  Component Value Date   LDLCALC 157 (H) 06/05/2019   Lab Results  Component Value Date   TRIG 127 06/05/2019   Lab Results  Component Value Date   CHOLHDL 3.6 06/28/2018   Lab Results  Component Value Date   HGBA1C 5.5 06/05/2019      Assessment & Plan:   Problem List Items Addressed This Visit      Other   Attention deficit hyperactivity disorder (ADHD), combined type    Doing well on her current regimen. Will send next 90 days.        Other Visit Diagnoses    Lateral epicondylitis of right elbow    -  Primary   Dizziness       Hypotension, unspecified hypotension type       Relevant Medications   metoprolol succinate (TOPROL-XL) 25 MG 24 hr tablet     Right lateral epicondylitis-discussed diagnosis.  Recommend getting an elbow strap, taking anti-inflammatory icing and doing the stretches.  Handout provided on the stretches.  Return in 2 to 3 weeks if not improving consider steroid injection if not improving.  Hypotension-we will decrease metoprolol down to 25 mg daily.Follow BPs over the last 2 weeks.  Hopefully fatigue will improve as well.   Meds ordered this encounter  Medications  . metoprolol succinate (TOPROL-XL) 25 MG 24 hr tablet    Sig: Take 1 tablet (25 mg total) by mouth daily. Take with or immediately following a meal.    Dispense:  90 tablet    Refill:  1  . amphetamine-dextroamphetamine (ADDERALL XR) 30 MG 24 hr capsule    Sig: Take 1 capsule (30 mg total) by mouth every morning.    Dispense:  30 capsule    Refill:  0  . amphetamine-dextroamphetamine (ADDERALL XR) 30 MG 24 hr capsule    Sig: Take 1 capsule (30 mg total) by mouth every morning.    Dispense:  30  capsule    Refill:  0  . amphetamine-dextroamphetamine (ADDERALL XR) 30 MG 24 hr capsule    Sig: Take 1 capsule (30 mg total) by mouth every morning.    Dispense:  30 capsule    Refill:  0    Follow-up: Return in about 6 weeks (around 08/02/2019) for Hypertension.    Beatrice Lecher, MD

## 2019-06-21 NOTE — Assessment & Plan Note (Signed)
Doing well on her current regimen. Will send next 90 days.

## 2019-07-03 ENCOUNTER — Other Ambulatory Visit: Payer: Self-pay

## 2019-07-03 ENCOUNTER — Ambulatory Visit (INDEPENDENT_AMBULATORY_CARE_PROVIDER_SITE_OTHER): Payer: 59 | Admitting: Family Medicine

## 2019-07-03 ENCOUNTER — Encounter (INDEPENDENT_AMBULATORY_CARE_PROVIDER_SITE_OTHER): Payer: Self-pay | Admitting: Family Medicine

## 2019-07-03 VITALS — BP 107/70 | HR 74 | Temp 98.3°F | Ht 67.0 in | Wt 234.0 lb

## 2019-07-03 DIAGNOSIS — Z6836 Body mass index (BMI) 36.0-36.9, adult: Secondary | ICD-10-CM | POA: Diagnosis not present

## 2019-07-03 DIAGNOSIS — E8881 Metabolic syndrome: Secondary | ICD-10-CM

## 2019-07-03 DIAGNOSIS — E7849 Other hyperlipidemia: Secondary | ICD-10-CM

## 2019-07-03 NOTE — Progress Notes (Signed)
Chief Complaint:   OBESITY Andrea Marquez is here to discuss her progress with her obesity treatment plan along with follow-up of her obesity related diagnoses. Andrea Marquez is on keeping a food journal and adhering to recommended goals of 1200-1400 calories and 85+ grams of protein daily and states she is following her eating plan approximately 50% of the time. Andrea Marquez states she is doing 0 minutes 0 times per week.  Today's visit was #: 3 Starting weight: 237 lbs Starting date: 06/05/2019 Today's weight: 234 lbs Today's date: 07/04/2019 Total lbs lost to date: 3 Total lbs lost since last in-office visit: 5  Interim History: Andrea Marquez was changed to journaling and she was able to journal at times, but she has tried to portion control and make smarter food choices and she has done well.  Subjective:   1. Insulin resistance Andrea Marquez has done well decreasing her simple carbohydrates, and her weight loss has improved. She notes decreased polyphagia.  2. Hyperlipidemia Andrea Marquez is working on diet and exercise, and she has decreased saturated fats.  Assessment/Plan:   1. Insulin resistance Andrea Marquez will continue to work on weight loss, diet, exercise, and decreasing simple carbohydrates to help decrease the risk of diabetes. We will recheck labs in 2 months. Andrea Marquez agreed to follow-up with [place] as directed to closely monitor her progress.  2. Hyperlipidemia Cardiovascular risk and specific lipid/LDL goals reviewed. We discussed several lifestyle modifications today. Andrea Marquez will continue her diet prescription, and she will continue to work on exercise and weight loss efforts. We will recheck labs in 2 months. Orders and follow up as documented in patient record.   3. Class 2 severe obesity with serious comorbidity and body mass index (BMI) of 36.0 to 36.9 in adult, unspecified obesity type (HCC) Andrea Marquez is currently in the action stage of change. As such, her goal is to continue with weight loss efforts. She has  agreed to keeping a food journal and adhering to recommended goals of 1200-1400 calories and 85+ grams of protein daily.   Behavioral modification strategies: increasing lean protein intake and keeping a strict food journal.  Andrea Marquez has agreed to follow-up with our clinic in 2 weeks. She was informed of the importance of frequent follow-up visits to maximize her success with intensive lifestyle modifications for her multiple health conditions.   Objective:   Blood pressure 107/70, pulse 74, temperature 98.3 F (36.8 C), temperature source Oral, height 5\' 7"  (1.702 m), weight 234 lb (106.1 kg), SpO2 97 %. Body mass index is 36.65 kg/m.  General: Cooperative, alert, well developed, in no acute distress. HEENT: Conjunctivae and lids unremarkable. Cardiovascular: Regular rhythm.  Lungs: Normal work of breathing. Neurologic: No focal deficits.   Lab Results  Component Value Date   CREATININE 0.67 06/05/2019   BUN 17 06/05/2019   NA 139 06/05/2019   K 4.6 06/05/2019   CL 98 06/05/2019   CO2 26 06/05/2019   Lab Results  Component Value Date   ALT 22 06/05/2019   AST 17 06/05/2019   ALKPHOS 74 06/05/2019   BILITOT 0.2 06/05/2019   Lab Results  Component Value Date   HGBA1C 5.5 06/05/2019   Lab Results  Component Value Date   INSULIN 11.8 06/05/2019   Lab Results  Component Value Date   TSH 2.540 06/05/2019   Lab Results  Component Value Date   CHOL 234 (H) 06/05/2019   HDL 54 06/05/2019   LDLCALC 157 (H) 06/05/2019   TRIG 127 06/05/2019   CHOLHDL  3.6 06/28/2018   Lab Results  Component Value Date   WBC 7.3 06/05/2019   HGB 15.4 06/05/2019   HCT 46.2 06/05/2019   MCV 97 06/05/2019   PLT 289 06/05/2019   No results found for: IRON, TIBC, FERRITIN  Attestation Statements:   Reviewed by clinician on day of visit: allergies, medications, problem list, medical history, surgical history, family history, social history, and previous encounter notes.  Time spent on  visit including pre-visit chart review and post-visit care and charting was 32 minutes.    I, Trixie Dredge, am acting as transcriptionist for Dennard Nip, MD.  I have reviewed the above documentation for accuracy and completeness, and I agree with the above. -  Dennard Nip, MD

## 2019-07-17 ENCOUNTER — Ambulatory Visit (INDEPENDENT_AMBULATORY_CARE_PROVIDER_SITE_OTHER): Payer: 59 | Admitting: Family Medicine

## 2019-07-17 ENCOUNTER — Other Ambulatory Visit: Payer: Self-pay

## 2019-07-17 VITALS — BP 118/80 | HR 64 | Temp 98.3°F | Ht 67.0 in | Wt 231.0 lb

## 2019-07-17 DIAGNOSIS — E7849 Other hyperlipidemia: Secondary | ICD-10-CM

## 2019-07-17 DIAGNOSIS — E8881 Metabolic syndrome: Secondary | ICD-10-CM

## 2019-07-17 DIAGNOSIS — Z6836 Body mass index (BMI) 36.0-36.9, adult: Secondary | ICD-10-CM

## 2019-07-18 ENCOUNTER — Encounter (INDEPENDENT_AMBULATORY_CARE_PROVIDER_SITE_OTHER): Payer: Self-pay | Admitting: Family Medicine

## 2019-07-18 ENCOUNTER — Ambulatory Visit (INDEPENDENT_AMBULATORY_CARE_PROVIDER_SITE_OTHER): Payer: 59

## 2019-07-18 DIAGNOSIS — Z1231 Encounter for screening mammogram for malignant neoplasm of breast: Secondary | ICD-10-CM

## 2019-07-18 NOTE — Progress Notes (Signed)
Chief Complaint:   OBESITY Andrea Marquez is here to discuss her progress with her obesity treatment plan along with follow-up of her obesity related diagnoses. Andrea Marquez is on keeping a food journal and adhering to recommended goals of 1200-1400 calories and 85+ grams of protein and states she is following her eating plan approximately 50% of the time. Andrea Marquez states she is being more active.  Today's visit was #: 4 Starting weight: 237 lbs Starting date: 06/05/2019 Today's weight: 231 lbs Today's date: 07/17/2019 Total lbs lost to date: 6 lbs Total lbs lost since last in-office visit: 3 lbs  Interim History: Andrea Marquez says that it is difficult to track in [employer].  She is only following the plan 50% of the time.  She is making her own meals and lives with her husband, who is only home on weekends.  Her daughter moves out soon.  At times, she skips lunch.  She says her cravings are pretty well-controlled.  Hunger is well-controlled.  Subjective:   1. Insulin resistance Andrea Marquez has a diagnosis of insulin resistance based on her elevated fasting insulin level >5. She continues to work on diet and exercise to decrease her risk of diabetes.  She is having some nighttime cravings for chocolate.  Lab Results  Component Value Date   INSULIN 11.8 06/05/2019   Lab Results  Component Value Date   HGBA1C 5.5 06/05/2019   2. Other hyperlipidemia Andrea Marquez has hyperlipidemia and has been trying to improve her cholesterol levels with intensive lifestyle modification including a low saturated fat diet, exercise and weight loss. She denies any chest pain, claudication or myalgias.    Lab Results  Component Value Date   ALT 22 06/05/2019   AST 17 06/05/2019   ALKPHOS 74 06/05/2019   BILITOT 0.2 06/05/2019   Lab Results  Component Value Date   CHOL 234 (H) 06/05/2019   HDL 54 06/05/2019   LDLCALC 157 (H) 06/05/2019   TRIG 127 06/05/2019   CHOLHDL 3.6 06/28/2018   Assessment/Plan:   1. Insulin  resistance Andrea Marquez will continue to work on weight loss, exercise, and decreasing simple carbohydrates to help decrease the risk of diabetes. Andrea Marquez agreed to follow-up with [place] as directed to closely monitor her progress.  Andrea Marquez will continue the prescribed diet and nutrition, weight loss.  Will continue to monitor for 3 months or so.  2. Other hyperlipidemia Cardiovascular risk and specific lipid/LDL goals reviewed.  We discussed several lifestyle modifications today and Andrea Marquez will continue to work on diet, exercise and weight loss efforts. Orders and follow up as documented in patient record. Continue weight loss, prudent diet.  Will continue to monitor alongside PCP.  Counseling Intensive lifestyle modifications are the first line treatment for this issue.  Dietary changes: Increase soluble fiber. Decrease simple carbohydrates.  Exercise changes: Moderate to vigorous-intensity aerobic activity 150 minutes per week if tolerated.  Lipid-lowering medications: see documented in medical record.  3. Class 2 severe obesity with serious comorbidity and body mass index (BMI) of 36.0 to 36.9 in adult, unspecified obesity type (HCC) Andrea Marquez is currently in the action stage of change. As such, her goal is to continue with weight loss efforts. She has agreed to keeping a food journal and adhering to recommended goals of 1200-1400 calories and 85+ grams of protein.   Exercise goals: All adults should avoid inactivity. Some physical activity is better than none, and adults who participate in any amount of physical activity gain some health benefits.  Behavioral  modification strategies: increasing lean protein intake and no skipping meals.  Focus n journaling at least 2 meals per day.  Breakfast and lunch is what she chooses.  Also, measure and look up all calories, etc. of each thing she eats.  Andrea Marquez has agreed to follow-up with our clinic in 2 weeks. She was informed of the importance of frequent  follow-up visits to maximize her success with intensive lifestyle modifications for her multiple health conditions.   Objective:   Blood pressure 118/80, pulse 64, temperature 98.3 F (36.8 C), temperature source Oral, height 5\' 7"  (1.702 m), weight 231 lb (104.8 kg), SpO2 98 %. Body mass index is 36.18 kg/m.  General: Cooperative, alert, well developed, in no acute distress. HEENT: Conjunctivae and lids unremarkable. Cardiovascular: Regular rhythm.  Lungs: Normal work of breathing. Neurologic: No focal deficits.   Lab Results  Component Value Date   CREATININE 0.67 06/05/2019   BUN 17 06/05/2019   NA 139 06/05/2019   K 4.6 06/05/2019   CL 98 06/05/2019   CO2 26 06/05/2019   Lab Results  Component Value Date   ALT 22 06/05/2019   AST 17 06/05/2019   ALKPHOS 74 06/05/2019   BILITOT 0.2 06/05/2019   Lab Results  Component Value Date   HGBA1C 5.5 06/05/2019   Lab Results  Component Value Date   INSULIN 11.8 06/05/2019   Lab Results  Component Value Date   TSH 2.540 06/05/2019   Lab Results  Component Value Date   CHOL 234 (H) 06/05/2019   HDL 54 06/05/2019   LDLCALC 157 (H) 06/05/2019   TRIG 127 06/05/2019   CHOLHDL 3.6 06/28/2018   Lab Results  Component Value Date   WBC 7.3 06/05/2019   HGB 15.4 06/05/2019   HCT 46.2 06/05/2019   MCV 97 06/05/2019   PLT 289 06/05/2019   Attestation Statements:   Reviewed by clinician on day of visit: allergies, medications, problem list, medical history, surgical history, family history, social history, and previous encounter notes.  Time spent on visit including pre-visit chart review and post-visit care and charting was 25 minutes.   I, Water quality scientist, CMA, am acting as Location manager for [employer], DO.  I have reviewed the above documentation for accuracy and completeness, and I agree with the above. Mellody Dance, DO

## 2019-07-22 ENCOUNTER — Other Ambulatory Visit: Payer: Self-pay | Admitting: Family Medicine

## 2019-07-22 DIAGNOSIS — F411 Generalized anxiety disorder: Secondary | ICD-10-CM

## 2019-07-22 DIAGNOSIS — F332 Major depressive disorder, recurrent severe without psychotic features: Secondary | ICD-10-CM

## 2019-07-23 NOTE — Telephone Encounter (Signed)
Last refill-04/05/2019  Last OV- 06/21/2019

## 2019-07-26 ENCOUNTER — Encounter (INDEPENDENT_AMBULATORY_CARE_PROVIDER_SITE_OTHER): Payer: Self-pay

## 2019-07-31 ENCOUNTER — Encounter (INDEPENDENT_AMBULATORY_CARE_PROVIDER_SITE_OTHER): Payer: Self-pay | Admitting: Family Medicine

## 2019-07-31 ENCOUNTER — Other Ambulatory Visit: Payer: Self-pay

## 2019-07-31 ENCOUNTER — Ambulatory Visit (INDEPENDENT_AMBULATORY_CARE_PROVIDER_SITE_OTHER): Payer: 59 | Admitting: Family Medicine

## 2019-07-31 VITALS — BP 115/81 | HR 78 | Temp 98.0°F | Ht 67.0 in | Wt 233.0 lb

## 2019-07-31 DIAGNOSIS — F39 Unspecified mood [affective] disorder: Secondary | ICD-10-CM

## 2019-07-31 DIAGNOSIS — Z6836 Body mass index (BMI) 36.0-36.9, adult: Secondary | ICD-10-CM

## 2019-07-31 DIAGNOSIS — F902 Attention-deficit hyperactivity disorder, combined type: Secondary | ICD-10-CM

## 2019-08-01 NOTE — Progress Notes (Signed)
Chief Complaint:   OBESITY Andrea Marquez is here to discuss her progress with her obesity treatment plan along with follow-up of her obesity related diagnoses. Andrea Marquez is on keeping a food journal and adhering to recommended goals of 1200-1400 calories and 85+ grams of protein and states she is following her eating plan approximately 50% of the time. Andrea Marquez states she is exercising for 0 minutes 0 times per week.  Today's visit was #: 5 Starting weight: 237 lbs Starting date: 06/05/2019 Today's weight: 233 lbs Today's date: 07/31/2019 Total lbs lost to date: 4 lbs Total lbs lost since last in-office visit: 2 lbs  Interim History: Andrea Marquez says, "I let every stress affect my success".  Her husband lost 40 pounds in 12 weeks.  She is stressed as her daughter Andrea Marquez is half moved out.  Also, her walking buddy quit on her recently.  She has also been out of her Adderall for 3 days now.  Subjective:   1. Attention deficit hyperactivity disorder (ADHD), combined type Andrea Marquez is not taking her ADHD medications.  She has increased stress in her life right now.  She stopped walking over the past couple of weeks as well.  2. Mood disorder (Andrea Marquez) Positive for mixed depression and anxiety.  She is taking Visteril for anxiety and Effexor for depression/mood stability, per PCP.  She has a follow-up with her PCP within the next couple of days for a refill.  Assessment/Plan:   1. Attention deficit hyperactivity disorder (ADHD), combined type Andrea Marquez was advised to not skip her ADHD medication.  2. Mood disorder (Andrea Marquez) Behavior modification, increase exercise, meditate twice daily, and medications per PCP recommendation.   3. Class 2 severe obesity with serious comorbidity and body mass index (BMI) of 36.0 to 36.9 in adult, unspecified obesity type (Andrea Marquez) Andrea Marquez is currently in the action stage of change. As such, her goal is to continue with weight loss efforts. She has agreed to keeping a food journal and  adhering to recommended goals of 1200-1400 calories and 90+ grams of protein.   Exercise goals: As is.  Behavioral modification strategies: increasing lean protein intake, increasing water intake, emotional eating strategies and decreasing junk food.  Use Calm app and do 10-15 minutes sessions twice daily "meditation for stress".  Walk for 30 minutes per day.  Take medications daily - do not skip Adderall.  Andrea Marquez has agreed to follow-up with our clinic in 2-3 weeks. She was informed of the importance of frequent follow-up visits to maximize her success with intensive lifestyle modifications for her multiple health conditions.   Objective:   Blood pressure 115/81, pulse 78, temperature 98 F (36.7 C), temperature source Oral, height 5\' 7"  (1.702 m), weight 233 lb (105.7 kg), SpO2 98 %. Body mass index is 36.49 kg/m.  General: Cooperative, alert, well developed, in no acute distress. HEENT: Conjunctivae and lids unremarkable. Cardiovascular: Regular rhythm.  Lungs: Normal work of breathing. Neurologic: No focal deficits.   Lab Results  Component Value Date   CREATININE 0.67 06/05/2019   BUN 17 06/05/2019   NA 139 06/05/2019   K 4.6 06/05/2019   CL 98 06/05/2019   CO2 26 06/05/2019   Lab Results  Component Value Date   ALT 22 06/05/2019   AST 17 06/05/2019   ALKPHOS 74 06/05/2019   BILITOT 0.2 06/05/2019   Lab Results  Component Value Date   HGBA1C 5.5 06/05/2019   Lab Results  Component Value Date   INSULIN 11.8 06/05/2019  Lab Results  Component Value Date   TSH 2.540 06/05/2019   Lab Results  Component Value Date   CHOL 234 (H) 06/05/2019   HDL 54 06/05/2019   LDLCALC 157 (H) 06/05/2019   TRIG 127 06/05/2019   CHOLHDL 3.6 06/28/2018   Lab Results  Component Value Date   WBC 7.3 06/05/2019   HGB 15.4 06/05/2019   HCT 46.2 06/05/2019   MCV 97 06/05/2019   PLT 289 06/05/2019   Attestation Statements:   Reviewed by clinician on day of visit:  allergies, medications, problem list, medical history, surgical history, family history, social history, and previous encounter notes.  Time spent on visit including pre-visit chart review and post-visit care and charting was 27 minutes.   I, Water quality scientist, CMA, am acting as Location manager for [employer], DO.  I have reviewed the above documentation for accuracy and completeness, and I agree with the above. Mellody Dance, DO

## 2019-08-02 ENCOUNTER — Encounter: Payer: Self-pay | Admitting: Family Medicine

## 2019-08-02 ENCOUNTER — Other Ambulatory Visit: Payer: Self-pay

## 2019-08-02 ENCOUNTER — Ambulatory Visit (INDEPENDENT_AMBULATORY_CARE_PROVIDER_SITE_OTHER): Payer: 59 | Admitting: Family Medicine

## 2019-08-02 VITALS — BP 112/71 | HR 79 | Ht 67.0 in | Wt 233.0 lb

## 2019-08-02 DIAGNOSIS — I10 Essential (primary) hypertension: Secondary | ICD-10-CM | POA: Diagnosis not present

## 2019-08-02 DIAGNOSIS — R42 Dizziness and giddiness: Secondary | ICD-10-CM

## 2019-08-02 DIAGNOSIS — F902 Attention-deficit hyperactivity disorder, combined type: Secondary | ICD-10-CM

## 2019-08-02 DIAGNOSIS — L03313 Cellulitis of chest wall: Secondary | ICD-10-CM | POA: Diagnosis not present

## 2019-08-02 DIAGNOSIS — F418 Other specified anxiety disorders: Secondary | ICD-10-CM

## 2019-08-02 MED ORDER — DOXYCYCLINE HYCLATE 100 MG PO TABS: 100.0000 mg | ORAL_TABLET | Freq: Two times a day (BID) | ORAL | 0 refills | Status: DC

## 2019-08-02 NOTE — Assessment & Plan Note (Signed)
Well on current regimen.  Did not seem to be contributing to her high blood pressures.

## 2019-08-02 NOTE — Progress Notes (Signed)
Established Patient Office Visit  Subjective:  Patient ID: Andrea Marquez, female    DOB: September 28, 1970  Age: 49 y.o. MRN: [id number]  CC:  Chief Complaint  Patient presents with  . Hypertension    HPI Andrea Marquez presents for follow-up of hypertension.  She has been taking lisinopril HCTZ as well as the metoprolol.  She was been doing well overall.  Though she started to feel dizzy after bending down and trying to stand back up.  It started last Thursday and lasted a couple of days.  She checked her blood pressure around that time and it was a little low at 107/67 so she actually held both of her blood pressure pills for several days then her blood pressure started to increase again and starting on Monday she started to take the medications again.  She said she felt really tired all weekend and just slept a lot.  She noted that Saturday morning she noted a little blood in her urine she was having a little bit of pressure and wondered if she might be passing a kidney stone.  She is actually felt okay since then she has not had any urgency or dysuria fevers or chills etc. and she has not noticed any more blood in the urine.  Also complains of a rash on her left breast.  She has not felt any swelling or induration.  She says initially it just showed up as a bump but now it almost looks erythematous with some streaking going towards the nipple.  She denies any itching or irritation.  The skin is actually starting to peel.  She does not think she had any type of insect bite or sting.  Past Medical History:  Diagnosis Date  . ADHD   . Anxiety   . Atrial tachycardia ([place])   . Back pain   . Depression    severe  . Female infertility   . GERD (gastroesophageal reflux disease)   . Hyperlipidemia   . Hypertension   . Obesity   . Palpitations   . PONV (postoperative nausea and vomiting)   . Prediabetes   . Severe anxiety   . Vertigo     Past Surgical History:  Procedure Laterality  Date  . AUGMENTATION MAMMAPLASTY    . BREAST ENHANCEMENT SURGERY    . LAPAROSCOPY    . NSVD     x2  . OOPHORECTOMY     Single   . TUBAL LIGATION    . VAGINAL HYSTERECTOMY      Family History  Problem Relation Age of Onset  . COPD Father   . Atrial fibrillation Father   . Diabetes Father   . Hypertension Father   . Depression Father   . Anxiety disorder Father   . Obesity Father   . Esophageal cancer Maternal Grandfather   . Hypertension Mother   . Skin cancer Mother   . Depression Mother   . Anxiety disorder Mother   . Bipolar disorder Mother   . Drug abuse Mother   . Obesity Mother   . Colon cancer Neg Hx   . Colon polyps Neg Hx   . Rectal cancer Neg Hx   . Stomach cancer Neg Hx     Social History   Socioeconomic History  . Marital status: Married    Spouse name: Randall Hiss  . Number of children: 2  . Years of education: Not on file  . Highest education level: Not on file  Occupational History  .  Occupation: Work from [employer]  . Smoking status: Never Smoker  . Smokeless tobacco: Never Used  Substance and Sexual Activity  . Alcohol use: Yes    Alcohol/week: 0.0 standard drinks    Comment: socially on weekends   . Drug use: No  . Sexual activity: Never  Other Topics Concern  . Not on file  Social History Narrative   Daily caffeine. Works on regularly.    Social Determinants of Health   Financial Resource Strain:   . Difficulty of Paying Living Expenses:   Food Insecurity:   . Worried About Charity fundraiser in the Last Year:   . Arboriculturist in the Last Year:   Transportation Needs:   . Film/video editor (Medical):   Marland Kitchen Lack of Transportation (Non-Medical):   Physical Activity:   . Days of Exercise per Week:   . Minutes of Exercise per Session:   Stress:   . Feeling of Stress :   Social Connections:   . Frequency of Communication with Friends and Family:   . Frequency of Social Gatherings with Friends and Family:   . Attends  Religious Services:   . Active Member of Clubs or Organizations:   . Attends Archivist Meetings:   Marland Kitchen Marital Status:   Intimate Partner Violence:   . Fear of Current or Ex-Partner:   . Emotionally Abused:   Marland Kitchen Physically Abused:   . Sexually Abused:     Outpatient Medications Prior to Visit  Medication Sig Dispense Refill  . amphetamine-dextroamphetamine (ADDERALL XR) 30 MG 24 hr capsule Take 1 capsule (30 mg total) by mouth every morning. 30 capsule 0  . [START ON 08/17/2019] amphetamine-dextroamphetamine (ADDERALL XR) 30 MG 24 hr capsule Take 1 capsule (30 mg total) by mouth every morning. 30 capsule 0  . [START ON 09/16/2019] amphetamine-dextroamphetamine (ADDERALL XR) 30 MG 24 hr capsule Take 1 capsule (30 mg total) by mouth every morning. 30 capsule 0  . hydrOXYzine (ATARAX/VISTARIL) 25 MG tablet Take 1 tablet (25 mg total) by mouth 3 (three) times daily as needed for anxiety. 90 tablet 2  . lisinopril-hydrochlorothiazide (ZESTORETIC) 20-25 MG tablet Take 1 tablet by mouth daily. 90 tablet 1  . metoprolol succinate (TOPROL-XL) 25 MG 24 hr tablet Take 1 tablet (25 mg total) by mouth daily. Take with or immediately following a meal. 90 tablet 1  . venlafaxine XR (EFFEXOR-XR) 150 MG 24 hr capsule TAKE 1 CAPSULE (150 MG TOTAL) BY MOUTH DAILY WITH BREAKFAST. 90 capsule 0  . venlafaxine XR (EFFEXOR-XR) 37.5 MG 24 hr capsule TAKE 1 CAPSULE BY MOUTH DAILY WITH BREAKFAST. 30 capsule 1   No facility-administered medications prior to visit.    No Active Allergies  ROS Review of Systems    Objective:    Physical Exam Constitutional:      Appearance: She is well-developed.  HENT:     Head: Normocephalic and atraumatic.  Cardiovascular:     Rate and Rhythm: Normal rate and regular rhythm.     Heart sounds: Normal heart sounds.  Pulmonary:     Effort: Pulmonary effort is normal.     Breath sounds: Normal breath sounds.  Skin:    General: Skin is warm and dry.     Comments:  She has an erythematous circular area on the left upper breast with a little fine scale just in the center no open wound or discharge.  No palpable nodule or induration.  There is actually  a little bit of streaking at the bottom of the lesion that is also erythematous.  She noticed it after having her mammogram done.  Neurological:     Mental Status: She is alert and oriented to person, place, and time.     Comments: Negative Dix-Hallpike maneuver.  Psychiatric:        Behavior: Behavior normal.     BP 112/71   Pulse 79   Ht 5\' 7"  (1.702 m)   Wt 233 lb (105.7 kg)   SpO2 97%   BMI 36.49 kg/m  Wt Readings from Last 3 Encounters:  08/02/19 233 lb (105.7 kg)  07/31/19 233 lb (105.7 kg)  07/17/19 231 lb (104.8 kg)     Health Maintenance Due  Topic Date Due  . Hepatitis C Screening  Never done  . COVID-19 Vaccine (1) Never done    There are no preventive care reminders to display for this patient.  Lab Results  Component Value Date   TSH 2.540 06/05/2019   Lab Results  Component Value Date   WBC 7.3 06/05/2019   HGB 15.4 06/05/2019   HCT 46.2 06/05/2019   MCV 97 06/05/2019   PLT 289 06/05/2019   Lab Results  Component Value Date   NA 139 06/05/2019   K 4.6 06/05/2019   CO2 26 06/05/2019   GLUCOSE 95 06/05/2019   BUN 17 06/05/2019   CREATININE 0.67 06/05/2019   BILITOT 0.2 06/05/2019   ALKPHOS 74 06/05/2019   AST 17 06/05/2019   ALT 22 06/05/2019   PROT 7.4 06/05/2019   ALBUMIN 4.8 06/05/2019   CALCIUM 9.8 06/05/2019   Lab Results  Component Value Date   CHOL 234 (H) 06/05/2019   Lab Results  Component Value Date   HDL 54 06/05/2019   Lab Results  Component Value Date   LDLCALC 157 (H) 06/05/2019   Lab Results  Component Value Date   TRIG 127 06/05/2019   Lab Results  Component Value Date   CHOLHDL 3.6 06/28/2018   Lab Results  Component Value Date   HGBA1C 5.5 06/05/2019      Assessment & Plan:   Problem List Items Addressed This Visit       Cardiovascular and Mediastinum   Hypertension - Primary    Blood pressure looks fantastic today it does sound like she has been having a couple of low pressures so we will continue with lisinopril HCTZ and have her split the metoprolol in half and just take a half a tab daily.  Follow back up in about 2 months.  Did discuss with her that if her blood pressure drops too low again to just hold the metoprolol and not stop both medications.        Other   Depression with anxiety    She feels like she is doing well overall on the venlafaxine.  We had fluoxetine on her intolerance list is causing sweating and insomnia but she feels like those are still persistent issues and likely not related to the fluoxetine so would like that removed from her intolerance list.      Attention deficit hyperactivity disorder (ADHD), combined type    Well on current regimen.  Did not seem to be contributing to her high blood pressures.       Other Visit Diagnoses    Dizziness       Cellulitis of chest wall         Dizziness as-based on clinical history it sounds most consistent  with benign positional vertigo though she is asymptomatic today and her Dix-Hallpike maneuver is also negative.  Certainly the low blood pressure could have contributed so I am going to adjust her metoprolol today.  Cellulitis versus rash on the left breast.  Because of the streaking I like to go ahead and be proactive by putting her on doxycycline for 5 days if she is not improving or the rash is getting worse please let us know.  She actually feels like it may actually be getting better.   Meds ordered this encounter  Medications  . doxycycline (VIBRA-TABS) 100 MG tablet    Sig: Take 1 tablet (100 mg total) by mouth 2 (two) times daily.    Dispense:  10 tablet    Refill:  0    Follow-up: Return in about 3 months (around 11/02/2019) for bp.    Beatrice Lecher, MD

## 2019-08-02 NOTE — Patient Instructions (Signed)
Cut the Metoprolol in half and continue to take the lisinopril-hctz.   Follow up in 2-3 months for BP.

## 2019-08-02 NOTE — Assessment & Plan Note (Signed)
She feels like she is doing well overall on the venlafaxine.  We had fluoxetine on her intolerance list is causing sweating and insomnia but she feels like those are still persistent issues and likely not related to the fluoxetine so would like that removed from her intolerance list.

## 2019-08-02 NOTE — Assessment & Plan Note (Signed)
Blood pressure looks fantastic today it does sound like she has been having a couple of low pressures so we will continue with lisinopril HCTZ and have her split the metoprolol in half and just take a half a tab daily.  Follow back up in about 2 months.  Did discuss with her that if her blood pressure drops too low again to just hold the metoprolol and not stop both medications.

## 2019-08-23 ENCOUNTER — Ambulatory Visit (INDEPENDENT_AMBULATORY_CARE_PROVIDER_SITE_OTHER): Payer: 59 | Admitting: Family Medicine

## 2019-08-23 ENCOUNTER — Other Ambulatory Visit: Payer: Self-pay | Admitting: Family Medicine

## 2019-08-28 ENCOUNTER — Ambulatory Visit (INDEPENDENT_AMBULATORY_CARE_PROVIDER_SITE_OTHER): Payer: 59 | Admitting: Medical-Surgical

## 2019-08-28 ENCOUNTER — Other Ambulatory Visit: Payer: Self-pay

## 2019-08-28 ENCOUNTER — Encounter: Payer: Self-pay | Admitting: Medical-Surgical

## 2019-08-28 ENCOUNTER — Ambulatory Visit (INDEPENDENT_AMBULATORY_CARE_PROVIDER_SITE_OTHER): Payer: 59

## 2019-08-28 VITALS — BP 112/80 | HR 67 | Temp 98.2°F | Ht 67.0 in | Wt 228.9 lb

## 2019-08-28 DIAGNOSIS — S99911A Unspecified injury of right ankle, initial encounter: Secondary | ICD-10-CM

## 2019-08-28 NOTE — Progress Notes (Signed)
Subjective:    CC: Right ankle injury  HPI: Pleasant 49 year old female presenting today after having a fall in the yard this morning.  She was outside trying to bring her dogs in around 5:45 AM and she missed stepped in the dark and fell into a hole.  Notes that her right ankle had quite a bit of popping and has since then been very painful.  Immediately after the injury she was unable to bear weight and crawled into her home.  Since then she has been using ice packs 20 minutes on followed by 20 minutes off as well as keeping the ankle elevated.  She also took a large dose of ibuprofen which has helped some.  She has a small compression brace to the right ankle and found that the neoprene knee sleeve to the right knee also help stabilize her ankle when walking.  She is now able to walk on her ankle but it is pretty tender and she wanted to make sure that everything was okay.  Notes that she does have a little bit of discomfort and soreness around her right knee as well as up into her right hip and lower back but denies numbness, tingling, saddle paresthesias, and color changes to the extremity.  I reviewed the past medical history, family history, social history, surgical history, and allergies today and no changes were needed.  Please see the problem list section below in epic for further details.  Past Medical History: Past Medical History:  Diagnosis Date  . ADHD   . Anxiety   . Atrial tachycardia ([place])   . Back pain   . Depression    severe  . Female infertility   . GERD (gastroesophageal reflux disease)   . Hyperlipidemia   . Hypertension   . Obesity   . Palpitations   . PONV (postoperative nausea and vomiting)   . Prediabetes   . Severe anxiety   . Vertigo    Past Surgical History: Past Surgical History:  Procedure Laterality Date  . AUGMENTATION MAMMAPLASTY    . BREAST ENHANCEMENT SURGERY    . LAPAROSCOPY    . NSVD     x2  . OOPHORECTOMY     Single   . TUBAL LIGATION     . VAGINAL HYSTERECTOMY     Social History: Social History   Socioeconomic History  . Marital status: Married    Spouse name: Randall Hiss  . Number of children: 2  . Years of education: Not on file  . Highest education level: Not on file  Occupational History  . Occupation: Work from [employer]  . Smoking status: Never Smoker  . Smokeless tobacco: Never Used  Substance and Sexual Activity  . Alcohol use: Yes    Alcohol/week: 0.0 standard drinks    Comment: socially on weekends   . Drug use: No  . Sexual activity: Never  Other Topics Concern  . Not on file  Social History Narrative   Daily caffeine. Works on regularly.    Social Determinants of Health   Financial Resource Strain:   . Difficulty of Paying Living Expenses:   Food Insecurity:   . Worried About Charity fundraiser in the Last Year:   . Arboriculturist in the Last Year:   Transportation Needs:   . Film/video editor (Medical):   Marland Kitchen Lack of Transportation (Non-Medical):   Physical Activity:   . Days of Exercise per Week:   . Minutes of Exercise  per Session:   Stress:   . Feeling of Stress :   Social Connections:   . Frequency of Communication with Friends and Family:   . Frequency of Social Gatherings with Friends and Family:   . Attends Religious Services:   . Active Member of Clubs or Organizations:   . Attends Archivist Meetings:   Marland Kitchen Marital Status:    Family History: Family History  Problem Relation Age of Onset  . COPD Father   . Atrial fibrillation Father   . Diabetes Father   . Hypertension Father   . Depression Father   . Anxiety disorder Father   . Obesity Father   . Esophageal cancer Maternal Grandfather   . Hypertension Mother   . Skin cancer Mother   . Depression Mother   . Anxiety disorder Mother   . Bipolar disorder Mother   . Drug abuse Mother   . Obesity Mother   . Colon cancer Neg Hx   . Colon polyps Neg Hx   . Rectal cancer Neg Hx   . Stomach cancer Neg  Hx    Allergies: No Known Allergies Medications: See med rec.  Review of Systems: See HPI for pertinent positives and negatives.   Objective:    General: Well Developed, well nourished, and in no acute distress.  Neuro: Alert and oriented x3.  HEENT: Normocephalic, atraumatic.  Skin: Warm and dry. Cardiac: Regular rate and rhythm, no murmurs rubs or gallops, no lower extremity edema.  Respiratory: Clear to auscultation bilaterally. Not using accessory muscles, speaking in full sentences. Right ankle/foot: Mild bruising to the lateral ankle.  Tenderness to palpation over the anterior and lateral ankle as well as the posterior ankle just lateral to the Achilles tendon.  Impression and Recommendations:    1. Right ankle injury, initial encounter Since she was unable to bear weight on it immediately after injury, we will go ahead and get x-rays today.  Recommend ibuprofen 800 mg every 8 hours, continued rest, elevation, and regular icing.  Although the small ankle brace she is wearing gives some support, recommend a more supportive brace that extends up the leg.  Patient reports her daughter had one that laces up and she is going to look for that one.  Return if symptoms worsen or fail to improve. ___________________________________________ Clearnce Sorrel, DNP, APRN, FNP-BC [facility] [place]

## 2019-09-14 ENCOUNTER — Other Ambulatory Visit: Payer: Self-pay | Admitting: Family Medicine

## 2019-10-10 ENCOUNTER — Encounter: Payer: Self-pay | Admitting: Family Medicine

## 2019-10-21 ENCOUNTER — Other Ambulatory Visit: Payer: Self-pay | Admitting: Physician Assistant

## 2019-10-21 DIAGNOSIS — F332 Major depressive disorder, recurrent severe without psychotic features: Secondary | ICD-10-CM

## 2019-10-22 ENCOUNTER — Other Ambulatory Visit: Payer: Self-pay | Admitting: Family Medicine

## 2019-10-22 DIAGNOSIS — F332 Major depressive disorder, recurrent severe without psychotic features: Secondary | ICD-10-CM

## 2019-10-22 DIAGNOSIS — F411 Generalized anxiety disorder: Secondary | ICD-10-CM

## 2019-10-22 MED ORDER — VENLAFAXINE HCL ER 150 MG PO CP24: 150.0000 mg | ORAL_CAPSULE | Freq: Every day | ORAL | 0 refills | Status: DC

## 2019-10-29 ENCOUNTER — Other Ambulatory Visit: Payer: Self-pay | Admitting: *Deleted

## 2019-10-29 ENCOUNTER — Other Ambulatory Visit: Payer: Self-pay | Admitting: Physician Assistant

## 2019-10-29 ENCOUNTER — Encounter: Payer: Self-pay | Admitting: Family Medicine

## 2019-10-29 DIAGNOSIS — F332 Major depressive disorder, recurrent severe without psychotic features: Secondary | ICD-10-CM

## 2019-10-29 MED ORDER — VENLAFAXINE HCL ER 37.5 MG PO CP24: ORAL_CAPSULE | ORAL | 1 refills | Status: DC

## 2019-11-05 ENCOUNTER — Ambulatory Visit: Payer: 59 | Admitting: Family Medicine

## 2019-11-20 ENCOUNTER — Other Ambulatory Visit: Payer: Self-pay

## 2019-11-20 ENCOUNTER — Encounter: Payer: Self-pay | Admitting: Family Medicine

## 2019-11-20 ENCOUNTER — Ambulatory Visit (INDEPENDENT_AMBULATORY_CARE_PROVIDER_SITE_OTHER): Payer: 59 | Admitting: Family Medicine

## 2019-11-20 DIAGNOSIS — F332 Major depressive disorder, recurrent severe without psychotic features: Secondary | ICD-10-CM

## 2019-11-20 DIAGNOSIS — I10 Essential (primary) hypertension: Secondary | ICD-10-CM | POA: Diagnosis not present

## 2019-11-20 DIAGNOSIS — R42 Dizziness and giddiness: Secondary | ICD-10-CM

## 2019-11-20 DIAGNOSIS — F902 Attention-deficit hyperactivity disorder, combined type: Secondary | ICD-10-CM | POA: Diagnosis not present

## 2019-11-20 MED ORDER — METOPROLOL SUCCINATE ER 25 MG PO TB24
12.5000 mg | ORAL_TABLET | Freq: Every day | ORAL | 0 refills | Status: DC
Start: 2019-11-20 — End: 2020-02-29

## 2019-11-20 MED ORDER — AMPHETAMINE-DEXTROAMPHETAMINE 10 MG PO TABS: 10.0000 mg | ORAL_TABLET | Freq: Every day | ORAL | 0 refills | Status: DC

## 2019-11-20 MED ORDER — VENLAFAXINE HCL ER 37.5 MG PO CP24: ORAL_CAPSULE | ORAL | 0 refills | Status: DC

## 2019-11-20 MED ORDER — AMPHETAMINE-DEXTROAMPHET ER 30 MG PO CP24
30.0000 mg | ORAL_CAPSULE | ORAL | 0 refills | Status: DC
Start: 2019-11-20 — End: 2020-01-21

## 2019-11-20 NOTE — Progress Notes (Signed)
Established Patient Office Visit  Subjective:  Patient ID: Andrea Marquez, female    DOB: 03-19-1970  Age: 49 y.o. MRN: [id number]  CC:  Chief Complaint  Patient presents with  . Hypertension    HPI Andrea Marquez presents for 3 months f/u  ADD - Reports her symptoms are not well controlled she still really struggling completing tasks.. Denies any problems with insomnia, chest pain, palpitations, or SOB.    Hypertension- Pt denies chest pain, SOB, dizziness, or heart palpitations.  Taking meds as directed w/o problems.  Denies medication side effects.   She still having episodes of feeling very hot and sweaty and anxious at times she says she will take her Atarax and usually has to take 2 tabs but that does seem to work well without making her feel sleepy to help with some of the more adrenaline type symptoms that she experiences.  Still getting intermittent vertigo.  She said had gone away for a while but seems to be coming back periodically.  She is been try to do some of the exercises at home and that that has helped some.    Past Medical History:  Diagnosis Date  . ADHD   . Anxiety   . Atrial tachycardia ([place])   . Back pain   . Depression    severe  . Female infertility   . GERD (gastroesophageal reflux disease)   . Hyperlipidemia   . Hypertension   . Obesity   . Palpitations   . PONV (postoperative nausea and vomiting)   . Prediabetes   . Severe anxiety   . Vertigo     Past Surgical History:  Procedure Laterality Date  . AUGMENTATION MAMMAPLASTY    . BREAST ENHANCEMENT SURGERY    . LAPAROSCOPY    . NSVD     x2  . OOPHORECTOMY     Single   . TOTAL VAGINAL HYSTERECTOMY    . TUBAL LIGATION      Family History  Problem Relation Age of Onset  . COPD Father   . Atrial fibrillation Father   . Diabetes Father   . Hypertension Father   . Depression Father   . Anxiety disorder Father   . Obesity Father   . Esophageal cancer Maternal Grandfather   .  Hypertension Mother   . Skin cancer Mother   . Depression Mother   . Anxiety disorder Mother   . Bipolar disorder Mother   . Drug abuse Mother   . Obesity Mother   . Colon cancer Neg Hx   . Colon polyps Neg Hx   . Rectal cancer Neg Hx   . Stomach cancer Neg Hx     Social History   Socioeconomic History  . Marital status: Married    Spouse name: Randall Hiss  . Number of children: 2  . Years of education: Not on file  . Highest education level: Not on file  Occupational History  . Occupation: Work from [employer]  . Smoking status: Never Smoker  . Smokeless tobacco: Never Used  Substance and Sexual Activity  . Alcohol use: Yes    Alcohol/week: 0.0 standard drinks    Comment: socially on weekends   . Drug use: No  . Sexual activity: Never  Other Topics Concern  . Not on file  Social History Narrative   Daily caffeine. Works on regularly.    Social Determinants of Health   Financial Resource Strain:   . Difficulty of Paying Living  Expenses: Not on file  Food Insecurity:   . Worried About Charity fundraiser in the Last Year: Not on file  . Ran Out of Food in the Last Year: Not on file  Transportation Needs:   . Lack of Transportation (Medical): Not on file  . Lack of Transportation (Non-Medical): Not on file  Physical Activity:   . Days of Exercise per Week: Not on file  . Minutes of Exercise per Session: Not on file  Stress:   . Feeling of Stress : Not on file  Social Connections:   . Frequency of Communication with Friends and Family: Not on file  . Frequency of Social Gatherings with Friends and Family: Not on file  . Attends Religious Services: Not on file  . Active Member of Clubs or Organizations: Not on file  . Attends Archivist Meetings: Not on file  . Marital Status: Not on file  Intimate Partner Violence:   . Fear of Current or Ex-Partner: Not on file  . Emotionally Abused: Not on file  . Physically Abused: Not on file  . Sexually  Abused: Not on file    Outpatient Medications Prior to Visit  Medication Sig Dispense Refill  . amphetamine-dextroamphetamine (ADDERALL XR) 30 MG 24 hr capsule Take 1 capsule (30 mg total) by mouth every morning. 30 capsule 0  . amphetamine-dextroamphetamine (ADDERALL XR) 30 MG 24 hr capsule Take 1 capsule (30 mg total) by mouth every morning. 30 capsule 0  . hydrOXYzine (ATARAX/VISTARIL) 25 MG tablet TAKE 1 TABLET (25 MG TOTAL) BY MOUTH 3 (THREE) TIMES DAILY AS NEEDED FOR ANXIETY. 90 tablet 2  . lisinopril-hydrochlorothiazide (ZESTORETIC) 20-25 MG tablet TAKE 1 TABLET BY MOUTH EVERY DAY 90 tablet 1  . venlafaxine XR (EFFEXOR-XR) 150 MG 24 hr capsule Take 1 capsule (150 mg total) by mouth daily with breakfast. 90 capsule 0  . amphetamine-dextroamphetamine (ADDERALL XR) 30 MG 24 hr capsule Take 1 capsule (30 mg total) by mouth every morning. 30 capsule 0  . metoprolol succinate (TOPROL-XL) 25 MG 24 hr tablet Take 1 tablet (25 mg total) by mouth daily. Take with or immediately following a meal. 90 tablet 1  . venlafaxine XR (EFFEXOR-XR) 37.5 MG 24 hr capsule TAKE 1 CAPSULE BY MOUTH DAILY WITH BREAKFAST. 30 capsule 1   No facility-administered medications prior to visit.    No Known Allergies  ROS Review of Systems    Objective:    Physical Exam Constitutional:      Appearance: She is well-developed.  HENT:     Head: Normocephalic and atraumatic.  Cardiovascular:     Rate and Rhythm: Normal rate and regular rhythm.     Heart sounds: Normal heart sounds.  Pulmonary:     Effort: Pulmonary effort is normal.     Breath sounds: Normal breath sounds.  Skin:    General: Skin is warm and dry.  Neurological:     Mental Status: She is alert and oriented to person, place, and time.  Psychiatric:        Behavior: Behavior normal.     BP 118/76   Pulse 87   Ht 5\' 7"  (1.702 m)   Wt 228 lb (103.4 kg)   SpO2 99%   BMI 35.71 kg/m  Wt Readings from Last 3 Encounters:  11/20/19 228 lb  (103.4 kg)  08/28/19 228 lb 14.4 oz (103.8 kg)  08/02/19 233 lb (105.7 kg)     Health Maintenance Due  Topic Date Due  .  Hepatitis C Screening  Never done    There are no preventive care reminders to display for this patient.  Lab Results  Component Value Date   TSH 2.540 06/05/2019   Lab Results  Component Value Date   WBC 7.3 06/05/2019   HGB 15.4 06/05/2019   HCT 46.2 06/05/2019   MCV 97 06/05/2019   PLT 289 06/05/2019   Lab Results  Component Value Date   NA 139 06/05/2019   K 4.6 06/05/2019   CO2 26 06/05/2019   GLUCOSE 95 06/05/2019   BUN 17 06/05/2019   CREATININE 0.67 06/05/2019   BILITOT 0.2 06/05/2019   ALKPHOS 74 06/05/2019   AST 17 06/05/2019   ALT 22 06/05/2019   PROT 7.4 06/05/2019   ALBUMIN 4.8 06/05/2019   CALCIUM 9.8 06/05/2019   Lab Results  Component Value Date   CHOL 234 (H) 06/05/2019   Lab Results  Component Value Date   HDL 54 06/05/2019   Lab Results  Component Value Date   LDLCALC 157 (H) 06/05/2019   Lab Results  Component Value Date   TRIG 127 06/05/2019   Lab Results  Component Value Date   CHOLHDL 3.6 06/28/2018   Lab Results  Component Value Date   HGBA1C 5.5 06/05/2019      Assessment & Plan:   Problem List Items Addressed This Visit      Cardiovascular and Mediastinum   Hypertension    Pressure looks really well controlled and she is taking half a tab of the metoprolol daily.      Relevant Medications   metoprolol succinate (TOPROL-XL) 25 MG 24 hr tablet     Other   Vertigo    Unfortunately, she is having some recurrence of symptoms offered to refer her back to formal PT for several sessions if needed she can let me know.  Continue with home exercises for now.      Severe episode of recurrent major depressive disorder, without psychotic features ([place])    Overall she feels like she is doing a little better.  She was able to circle some zeros on her PHQ-9 and GAD-7 and was really quite happy with that.   She is currently taking venlafaxine 150 mg plus a 37.5 mg.  Still having episodes of feeling anxious and sweaty.  Does use the hydroxyzine as needed.  Overall we will continue with current regimen I do wonder if some of the sweaty and adrenal symptoms might even be hormonal related.      Relevant Medications   venlafaxine XR (EFFEXOR-XR) 37.5 MG 24 hr capsule   Attention deficit hyperactivity disorder (ADHD), combined type    Discussed options.  We will add 10 mg of short acting Adderall around lunchtime to see if this helps for the next 30 days.  Continue the Adderall 30 mg extended release in the mornings if this is not helpful in helping to control her symptoms and helping her complete tasks then will switch to Concerta.  She will let me know in 30 days via MyChart.  Otherwise I will see her back in 2 months.         Meds ordered this encounter  Medications  . amphetamine-dextroamphetamine (ADDERALL XR) 30 MG 24 hr capsule    Sig: Take 1 capsule (30 mg total) by mouth every morning.    Dispense:  30 capsule    Refill:  0  . venlafaxine XR (EFFEXOR-XR) 37.5 MG 24 hr capsule    Sig: TAKE 1  CAPSULE BY MOUTH DAILY WITH BREAKFAST.    Dispense:  90 capsule    Refill:  0  . amphetamine-dextroamphetamine (ADDERALL) 10 MG tablet    Sig: Take 1 tablet (10 mg total) by mouth daily at 12 noon.    Dispense:  30 tablet    Refill:  0  . metoprolol succinate (TOPROL-XL) 25 MG 24 hr tablet    Sig: Take 0.5 tablets (12.5 mg total) by mouth daily. Take with or immediately following a meal.    Dispense:  1 tablet    Refill:  0    Follow-up: Return in about 2 months (around 01/20/2020) for adjustment on ADHD meds.    Beatrice Lecher, MD

## 2019-11-20 NOTE — Assessment & Plan Note (Signed)
Unfortunately, she is having some recurrence of symptoms offered to refer her back to formal PT for several sessions if needed she can let me know.  Continue with home exercises for now.

## 2019-11-20 NOTE — Assessment & Plan Note (Signed)
Overall she feels like she is doing a little better.  She was able to circle some zeros on her PHQ-9 and GAD-7 and was really quite happy with that.  She is currently taking venlafaxine 150 mg plus a 37.5 mg.  Still having episodes of feeling anxious and sweaty.  Does use the hydroxyzine as needed.  Overall we will continue with current regimen I do wonder if some of the sweaty and adrenal symptoms might even be hormonal related.

## 2019-11-20 NOTE — Assessment & Plan Note (Addendum)
Discussed options.  We will add 10 mg of short acting Adderall around lunchtime to see if this helps for the next 30 days.  Continue the Adderall 30 mg extended release in the mornings if this is not helpful in helping to control her symptoms and helping her complete tasks then will switch to Concerta.  She will let me know in 30 days via MyChart.  Otherwise I will see her back in 2 months.

## 2019-11-20 NOTE — Assessment & Plan Note (Signed)
Pressure looks really well controlled and she is taking half a tab of the metoprolol daily.

## 2019-11-30 ENCOUNTER — Other Ambulatory Visit: Payer: Self-pay | Admitting: Family Medicine

## 2019-12-01 MED ORDER — AMPHETAMINE-DEXTROAMPHET ER 30 MG PO CP24
30.0000 mg | ORAL_CAPSULE | ORAL | 0 refills | Status: DC
Start: 2019-12-01 — End: 2020-01-21

## 2019-12-01 MED ORDER — AMPHETAMINE-DEXTROAMPHET ER 30 MG PO CP24
30.0000 mg | ORAL_CAPSULE | ORAL | 0 refills | Status: DC
Start: 2019-12-30 — End: 2020-01-21

## 2019-12-24 ENCOUNTER — Other Ambulatory Visit: Payer: Self-pay

## 2019-12-24 ENCOUNTER — Encounter: Payer: Self-pay | Admitting: Family Medicine

## 2019-12-24 ENCOUNTER — Ambulatory Visit (INDEPENDENT_AMBULATORY_CARE_PROVIDER_SITE_OTHER): Payer: 59 | Admitting: Family Medicine

## 2019-12-24 VITALS — BP 126/86 | HR 76 | Temp 98.1°F | Ht 67.0 in | Wt 237.0 lb

## 2019-12-24 DIAGNOSIS — B001 Herpesviral vesicular dermatitis: Secondary | ICD-10-CM

## 2019-12-24 DIAGNOSIS — K120 Recurrent oral aphthae: Secondary | ICD-10-CM

## 2019-12-24 MED ORDER — TRIAMCINOLONE ACETONIDE 0.1 % MT PSTE: 1.0000 "application " | PASTE | Freq: Two times a day (BID) | OROMUCOSAL | 1 refills | Status: DC

## 2019-12-24 MED ORDER — VALACYCLOVIR HCL 1 G PO TABS: 2000.0000 mg | ORAL_TABLET | Freq: Two times a day (BID) | ORAL | 5 refills | Status: DC

## 2019-12-24 NOTE — Progress Notes (Signed)
Acute Office Visit  Subjective:    Patient ID: Andrea Marquez, female    DOB: [birth year], 49 y.o.   MRN: [id number]  Chief Complaint  Patient presents with  . Mouth Lesions    HPI Patient is in today for sore on the right side of her tongue.  She says its been there for 3 weeks now which is unusual she has had ulcers there before but they usually resolve a little bit more quickly she also has a history of oral herpes and had a breakout on her upper lip that started about a day or 2 ago.  She says that these ulcer on the right side of her tongue is getting to the point where it is just really uncomfortable to eat and even is keeping her awake at night she is tried everything from ibuprofen, baking soda, salt water gargles, hot sage tea leaves and even numbing gel nothing seems to be getting it to go away completely.  She thought maybe her nightguard was causing some irritation so she has not been wearing that as either.  Past Medical History:  Diagnosis Date  . ADHD   . Anxiety   . Atrial tachycardia ([place])   . Back pain   . Depression    severe  . Female infertility   . GERD (gastroesophageal reflux disease)   . Hyperlipidemia   . Hypertension   . Obesity   . Palpitations   . PONV (postoperative nausea and vomiting)   . Prediabetes   . Severe anxiety   . Vertigo     Past Surgical History:  Procedure Laterality Date  . AUGMENTATION MAMMAPLASTY    . BREAST ENHANCEMENT SURGERY    . LAPAROSCOPY    . NSVD     x2  . OOPHORECTOMY     Single   . TOTAL VAGINAL HYSTERECTOMY    . TUBAL LIGATION      Family History  Problem Relation Age of Onset  . COPD Father   . Atrial fibrillation Father   . Diabetes Father   . Hypertension Father   . Depression Father   . Anxiety disorder Father   . Obesity Father   . Esophageal cancer Maternal Grandfather   . Hypertension Mother   . Skin cancer Mother   . Depression Mother   . Anxiety disorder Mother   . Bipolar disorder Mother    . Drug abuse Mother   . Obesity Mother   . Colon cancer Neg Hx   . Colon polyps Neg Hx   . Rectal cancer Neg Hx   . Stomach cancer Neg Hx     Social History   Socioeconomic History  . Marital status: Married    Spouse name: Randall Hiss  . Number of children: 2  . Years of education: Not on file  . Highest education level: Not on file  Occupational History  . Occupation: Work from [employer]  . Smoking status: Never Smoker  . Smokeless tobacco: Never Used  Substance and Sexual Activity  . Alcohol use: Yes    Alcohol/week: 0.0 standard drinks    Comment: socially on weekends   . Drug use: No  . Sexual activity: Never  Other Topics Concern  . Not on file  Social History Narrative   Daily caffeine. Works on regularly.    Social Determinants of Health   Financial Resource Strain:   . Difficulty of Paying Living Expenses: Not on file  Food Insecurity:   .  Worried About Charity fundraiser in the Last Year: Not on file  . Ran Out of Food in the Last Year: Not on file  Transportation Needs:   . Lack of Transportation (Medical): Not on file  . Lack of Transportation (Non-Medical): Not on file  Physical Activity:   . Days of Exercise per Week: Not on file  . Minutes of Exercise per Session: Not on file  Stress:   . Feeling of Stress : Not on file  Social Connections:   . Frequency of Communication with Friends and Family: Not on file  . Frequency of Social Gatherings with Friends and Family: Not on file  . Attends Religious Services: Not on file  . Active Member of Clubs or Organizations: Not on file  . Attends Archivist Meetings: Not on file  . Marital Status: Not on file  Intimate Partner Violence:   . Fear of Current or Ex-Partner: Not on file  . Emotionally Abused: Not on file  . Physically Abused: Not on file  . Sexually Abused: Not on file    Outpatient Medications Prior to Visit  Medication Sig Dispense Refill  . amphetamine-dextroamphetamine  (ADDERALL XR) 30 MG 24 hr capsule Take 1 capsule (30 mg total) by mouth every morning. 30 capsule 0  . amphetamine-dextroamphetamine (ADDERALL XR) 30 MG 24 hr capsule Take 1 capsule (30 mg total) by mouth every morning. 30 capsule 0  . [START ON 12/30/2019] amphetamine-dextroamphetamine (ADDERALL XR) 30 MG 24 hr capsule Take 1 capsule (30 mg total) by mouth every morning. 30 capsule 0  . amphetamine-dextroamphetamine (ADDERALL) 10 MG tablet Take 1 tablet (10 mg total) by mouth daily at 12 noon. 30 tablet 0  . hydrOXYzine (ATARAX/VISTARIL) 25 MG tablet TAKE 1 TABLET (25 MG TOTAL) BY MOUTH 3 (THREE) TIMES DAILY AS NEEDED FOR ANXIETY. 90 tablet 2  . lisinopril-hydrochlorothiazide (ZESTORETIC) 20-25 MG tablet TAKE 1 TABLET BY MOUTH EVERY DAY 90 tablet 1  . metoprolol succinate (TOPROL-XL) 25 MG 24 hr tablet Take 0.5 tablets (12.5 mg total) by mouth daily. Take with or immediately following a meal. 1 tablet 0  . venlafaxine XR (EFFEXOR-XR) 150 MG 24 hr capsule Take 1 capsule (150 mg total) by mouth daily with breakfast. 90 capsule 0  . venlafaxine XR (EFFEXOR-XR) 37.5 MG 24 hr capsule TAKE 1 CAPSULE BY MOUTH DAILY WITH BREAKFAST. 90 capsule 0   No facility-administered medications prior to visit.    No Known Allergies  Review of Systems     Objective:    Physical Exam Vitals reviewed.  Constitutional:      Appearance: She is well-developed.  HENT:     Head: Normocephalic and atraumatic.     Mouth/Throat:     Mouth: Mucous membranes are moist.     Pharynx: Oropharynx is clear. No oropharyngeal exudate or posterior oropharyngeal erythema.  Eyes:     Conjunctiva/sclera: Conjunctivae normal.  Cardiovascular:     Rate and Rhythm: Normal rate.  Pulmonary:     Effort: Pulmonary effort is normal.  Skin:    General: Skin is dry.     Coloration: Skin is not pale.     Comments: On her upper lip she has a scabbed area most consistent with oral herpes.  On the anterior right side of her tongue she  has an approximately 2 cm white oval-shaped ulceration that is mildly erythematous in the middle.  No active bleeding or drainage.  Neurological:     Mental Status: She is  alert and oriented to person, place, and time.  Psychiatric:        Behavior: Behavior normal.     BP 126/86   Pulse 76   Temp 98.1 F (36.7 C) (Oral)   Ht 5\' 7"  (1.702 m)   Wt 237 lb (107.5 kg)   SpO2 100% Comment: on RA  BMI 37.12 kg/m  Wt Readings from Last 3 Encounters:  12/24/19 237 lb (107.5 kg)  11/20/19 228 lb (103.4 kg)  08/28/19 228 lb 14.4 oz (103.8 kg)    Health Maintenance Due  Topic Date Due  . Hepatitis C Screening  Never done  . COVID-19 Vaccine (1) Never done    There are no preventive care reminders to display for this patient.   Lab Results  Component Value Date   TSH 2.540 06/05/2019   Lab Results  Component Value Date   WBC 7.3 06/05/2019   HGB 15.4 06/05/2019   HCT 46.2 06/05/2019   MCV 97 06/05/2019   PLT 289 06/05/2019   Lab Results  Component Value Date   NA 139 06/05/2019   K 4.6 06/05/2019   CO2 26 06/05/2019   GLUCOSE 95 06/05/2019   BUN 17 06/05/2019   CREATININE 0.67 06/05/2019   BILITOT 0.2 06/05/2019   ALKPHOS 74 06/05/2019   AST 17 06/05/2019   ALT 22 06/05/2019   PROT 7.4 06/05/2019   ALBUMIN 4.8 06/05/2019   CALCIUM 9.8 06/05/2019   Lab Results  Component Value Date   CHOL 234 (H) 06/05/2019   Lab Results  Component Value Date   HDL 54 06/05/2019   Lab Results  Component Value Date   LDLCALC 157 (H) 06/05/2019   Lab Results  Component Value Date   TRIG 127 06/05/2019   Lab Results  Component Value Date   CHOLHDL 3.6 06/28/2018   Lab Results  Component Value Date   HGBA1C 5.5 06/05/2019       Assessment & Plan:   Problem List Items Addressed This Visit      Digestive   Cold sore   Relevant Medications   valACYclovir (VALTREX) 1000 MG tablet    Other Visit Diagnoses    Canker sore    -  Primary     Canker sore/tongue  ulceration-a little unusual to be present x3 weeks.  We will go ahead and refill her antiviral which she also uses for her oral herpes and she did has had a right outbreak on her right upper lip as well.  Get have her apply a topical steroid paste and see if this improves over the next 3 to 4 days.  If not resolving then she will likely need further evaluation with possible biopsy by oral surgery.  Cold sore-we will treat with oral valacyclovir.  Prescription sent to pharmacy.  Meds ordered this encounter  Medications  . valACYclovir (VALTREX) 1000 MG tablet    Sig: Take 2 tablets (2,000 mg total) by mouth 2 (two) times daily. X 1 day. ( 4 tabs per episode)    Dispense:  20 tablet    Refill:  5  . triamcinolone (KENALOG) 0.1 % paste    Sig: Use as directed 1 application in the mouth or throat 2 (two) times daily. Side of tongue    Dispense:  5 g    Refill:  1     Beatrice Lecher, MD

## 2019-12-30 ENCOUNTER — Encounter: Payer: Self-pay | Admitting: Family Medicine

## 2019-12-31 ENCOUNTER — Ambulatory Visit (INDEPENDENT_AMBULATORY_CARE_PROVIDER_SITE_OTHER): Payer: 59 | Admitting: Family Medicine

## 2019-12-31 ENCOUNTER — Encounter: Payer: Self-pay | Admitting: Family Medicine

## 2019-12-31 VITALS — BP 127/89 | HR 75 | Ht 67.0 in | Wt 234.0 lb

## 2019-12-31 DIAGNOSIS — K1370 Unspecified lesions of oral mucosa: Secondary | ICD-10-CM

## 2019-12-31 MED ORDER — NYSTATIN 100000 UNIT/ML MT SUSP: 5.0000 mL | Freq: Four times a day (QID) | OROMUCOSAL | 0 refills | Status: DC

## 2019-12-31 NOTE — Progress Notes (Signed)
Established Patient Office Visit  Subjective:  Patient ID: Andrea Marquez, female    DOB: Aug 19, 1970  Age: 49 y.o. MRN: [id number]  CC:  Chief Complaint  Patient presents with  . Mouth Lesions    HPI Andrea Marquez presents for mouth lesions.  Unfortunately they are not really getting better she has been doing the steroid paste.  She is been swishing with salt water gargles as well as doing a peroxide concentrated rinse.  He says everything is just feels sore and tender it is painful to eat she says things that are cold or hot actually are more uncomfortable.  She denies any known food reactions or allergies.  We did not make any changes to her medication regimen recently.  She feels like she is getting more lesions in her mouth and is concerned.  She is actually lost a few pounds because she has not really been eating very much.  She says she is also getting pain in the back of her throat mostly on the right side and in the middle.  The left actually feels okay.  Sometimes when it is bothering her on the right side it will radiate into her right ear.  Past Medical History:  Diagnosis Date  . ADHD   . Anxiety   . Atrial tachycardia ([place])   . Back pain   . Depression    severe  . Female infertility   . GERD (gastroesophageal reflux disease)   . Hyperlipidemia   . Hypertension   . Obesity   . Palpitations   . PONV (postoperative nausea and vomiting)   . Prediabetes   . Severe anxiety   . Vertigo     Past Surgical History:  Procedure Laterality Date  . AUGMENTATION MAMMAPLASTY    . BREAST ENHANCEMENT SURGERY    . LAPAROSCOPY    . NSVD     x2  . OOPHORECTOMY     Single   . TOTAL VAGINAL HYSTERECTOMY    . TUBAL LIGATION      Family History  Problem Relation Age of Onset  . COPD Father   . Atrial fibrillation Father   . Diabetes Father   . Hypertension Father   . Depression Father   . Anxiety disorder Father   . Obesity Father   . Esophageal cancer Maternal  Grandfather   . Hypertension Mother   . Skin cancer Mother   . Depression Mother   . Anxiety disorder Mother   . Bipolar disorder Mother   . Drug abuse Mother   . Obesity Mother   . Colon cancer Neg Hx   . Colon polyps Neg Hx   . Rectal cancer Neg Hx   . Stomach cancer Neg Hx     Social History   Socioeconomic History  . Marital status: Married    Spouse name: Randall Hiss  . Number of children: 2  . Years of education: Not on file  . Highest education level: Not on file  Occupational History  . Occupation: Work from [employer]  . Smoking status: Never Smoker  . Smokeless tobacco: Never Used  Substance and Sexual Activity  . Alcohol use: Yes    Alcohol/week: 0.0 standard drinks    Comment: socially on weekends   . Drug use: No  . Sexual activity: Never  Other Topics Concern  . Not on file  Social History Narrative   Daily caffeine. Works on regularly.    Social Determinants of Health  Financial Resource Strain:   . Difficulty of Paying Living Expenses: Not on file  Food Insecurity:   . Worried About Charity fundraiser in the Last Year: Not on file  . Ran Out of Food in the Last Year: Not on file  Transportation Needs:   . Lack of Transportation (Medical): Not on file  . Lack of Transportation (Non-Medical): Not on file  Physical Activity:   . Days of Exercise per Week: Not on file  . Minutes of Exercise per Session: Not on file  Stress:   . Feeling of Stress : Not on file  Social Connections:   . Frequency of Communication with Friends and Family: Not on file  . Frequency of Social Gatherings with Friends and Family: Not on file  . Attends Religious Services: Not on file  . Active Member of Clubs or Organizations: Not on file  . Attends Archivist Meetings: Not on file  . Marital Status: Not on file  Intimate Partner Violence:   . Fear of Current or Ex-Partner: Not on file  . Emotionally Abused: Not on file  . Physically Abused: Not on file   . Sexually Abused: Not on file    Outpatient Medications Prior to Visit  Medication Sig Dispense Refill  . amphetamine-dextroamphetamine (ADDERALL XR) 30 MG 24 hr capsule Take 1 capsule (30 mg total) by mouth every morning. 30 capsule 0  . amphetamine-dextroamphetamine (ADDERALL XR) 30 MG 24 hr capsule Take 1 capsule (30 mg total) by mouth every morning. 30 capsule 0  . amphetamine-dextroamphetamine (ADDERALL XR) 30 MG 24 hr capsule Take 1 capsule (30 mg total) by mouth every morning. 30 capsule 0  . amphetamine-dextroamphetamine (ADDERALL) 10 MG tablet Take 1 tablet (10 mg total) by mouth daily at 12 noon. 30 tablet 0  . hydrOXYzine (ATARAX/VISTARIL) 25 MG tablet TAKE 1 TABLET (25 MG TOTAL) BY MOUTH 3 (THREE) TIMES DAILY AS NEEDED FOR ANXIETY. 90 tablet 2  . lisinopril-hydrochlorothiazide (ZESTORETIC) 20-25 MG tablet TAKE 1 TABLET BY MOUTH EVERY DAY 90 tablet 1  . metoprolol succinate (TOPROL-XL) 25 MG 24 hr tablet Take 0.5 tablets (12.5 mg total) by mouth daily. Take with or immediately following a meal. 1 tablet 0  . triamcinolone (KENALOG) 0.1 % paste Use as directed 1 application in the mouth or throat 2 (two) times daily. Side of tongue 5 g 1  . valACYclovir (VALTREX) 1000 MG tablet Take 2 tablets (2,000 mg total) by mouth 2 (two) times daily. X 1 day. ( 4 tabs per episode) 20 tablet 5  . venlafaxine XR (EFFEXOR-XR) 150 MG 24 hr capsule Take 1 capsule (150 mg total) by mouth daily with breakfast. 90 capsule 0  . venlafaxine XR (EFFEXOR-XR) 37.5 MG 24 hr capsule TAKE 1 CAPSULE BY MOUTH DAILY WITH BREAKFAST. 90 capsule 0   No facility-administered medications prior to visit.    No Known Allergies  ROS Review of Systems    Objective:    Physical Exam Constitutional:      Appearance: She is well-developed.  HENT:     Head: Normocephalic and atraumatic.     Right Ear: Tympanic membrane, ear canal and external ear normal.     Left Ear: Tympanic membrane, ear canal and external ear  normal.     Nose: Nose normal.     Mouth/Throat:     Mouth: Mucous membranes are moist.     Pharynx: Oropharynx is clear. No oropharyngeal exudate or posterior oropharyngeal erythema.  Eyes:  Conjunctiva/sclera: Conjunctivae normal.     Pupils: Pupils are equal, round, and reactive to light.  Neck:     Thyroid: No thyromegaly.  Pulmonary:     Effort: Pulmonary effort is normal.     Breath sounds: No wheezing.  Musculoskeletal:     Cervical back: Neck supple.  Lymphadenopathy:     Cervical: No cervical adenopathy.  Skin:    General: Skin is warm and dry.  Neurological:     Mental Status: She is alert and oriented to person, place, and time.     BP 127/89   Pulse 75   Ht 5\' 7"  (1.702 m)   Wt 234 lb (106.1 kg)   SpO2 100%   BMI 36.65 kg/m  Wt Readings from Last 3 Encounters:  12/31/19 234 lb (106.1 kg)  12/24/19 237 lb (107.5 kg)  11/20/19 228 lb (103.4 kg)     Health Maintenance Due  Topic Date Due  . Hepatitis C Screening  Never done    There are no preventive care reminders to display for this patient.  Lab Results  Component Value Date   TSH 2.540 06/05/2019   Lab Results  Component Value Date   WBC 7.3 06/05/2019   HGB 15.4 06/05/2019   HCT 46.2 06/05/2019   MCV 97 06/05/2019   PLT 289 06/05/2019   Lab Results  Component Value Date   NA 139 06/05/2019   K 4.6 06/05/2019   CO2 26 06/05/2019   GLUCOSE 95 06/05/2019   BUN 17 06/05/2019   CREATININE 0.67 06/05/2019   BILITOT 0.2 06/05/2019   ALKPHOS 74 06/05/2019   AST 17 06/05/2019   ALT 22 06/05/2019   PROT 7.4 06/05/2019   ALBUMIN 4.8 06/05/2019   CALCIUM 9.8 06/05/2019   Lab Results  Component Value Date   CHOL 234 (H) 06/05/2019   Lab Results  Component Value Date   HDL 54 06/05/2019   Lab Results  Component Value Date   LDLCALC 157 (H) 06/05/2019   Lab Results  Component Value Date   TRIG 127 06/05/2019   Lab Results  Component Value Date   CHOLHDL 3.6 06/28/2018    Lab Results  Component Value Date   HGBA1C 5.5 06/05/2019      Assessment & Plan:   Problem List Items Addressed This Visit    None    Visit Diagnoses    Oral lesion    -  Primary   Relevant Orders   Ambulatory referral to Oral Maxillofacial Surgery   CBC with Differential/Platelet   COMPLETE METABOLIC PANEL WITH GFR   HIV Antibody (routine testing w rflx)   RPR     Oral lesions-not responding to topical steroid and conservative care.  She was also on a round of valacyclovir for a cold sore. It healed the cold sore on her lip but didn't improve her mouth lesion. Consider viral.  No recent changes to medication.  Will check for HIV as well. Will check CBC with diff.  Will tx with nystatin and see if helps though it does not have the classic look for thrush.  It does look like the edges of her tongue are little bit swollen with imprints from her teeth.  But she does have a few small ulcerations that are scattered along the edges of the tongue mostly.  Did encourage her to stop using the salt water gargles and the peroxide for now since it is not helping and could actually be irritating.  I am  also can have her stop the topical steroid cream since that is not helping either and if anything she feels like it is actually making things worse.  Refer to oral surgeon for further work-up and possible biopsy.  Meds ordered this encounter  Medications  . nystatin (MYCOSTATIN) 100000 UNIT/ML suspension    Sig: Take 5 mLs (500,000 Units total) by mouth 4 (four) times daily. X 1 week. Swish and hold in mouth for at least 2 minutes and then swallow.    Dispense:  473 mL    Refill:  0    Follow-up: No follow-ups on file.    Beatrice Lecher, MD

## 2019-12-31 NOTE — Telephone Encounter (Signed)
Patient scheduled.

## 2020-01-01 LAB — CBC WITH DIFFERENTIAL/PLATELET
Absolute Monocytes: 431 cells/uL (ref 200–950)
Basophils Absolute: 47 cells/uL (ref 0–200)
Basophils Relative: 0.8 %
Eosinophils Absolute: 89 cells/uL (ref 15–500)
Eosinophils Relative: 1.5 %
HCT: 41.5 % (ref 35.0–45.0)
Hemoglobin: 14.4 g/dL (ref 11.7–15.5)
Lymphs Abs: 1623 cells/uL (ref 850–3900)
MCH: 32.2 pg (ref 27.0–33.0)
MCHC: 34.7 g/dL (ref 32.0–36.0)
MCV: 92.8 fL (ref 80.0–100.0)
MPV: 10.7 fL (ref 7.5–12.5)
Monocytes Relative: 7.3 %
Neutro Abs: 3711 cells/uL (ref 1500–7800)
Neutrophils Relative %: 62.9 %
Platelets: 320 10*3/uL (ref 140–400)
RBC: 4.47 10*6/uL (ref 3.80–5.10)
RDW: 12.6 % (ref 11.0–15.0)
Total Lymphocyte: 27.5 %
WBC: 5.9 10*3/uL (ref 3.8–10.8)

## 2020-01-01 LAB — COMPLETE METABOLIC PANEL WITH GFR
AG Ratio: 1.6 (calc) (ref 1.0–2.5)
ALT: 17 U/L (ref 6–29)
AST: 13 U/L (ref 10–35)
Albumin: 4.4 g/dL (ref 3.6–5.1)
Alkaline phosphatase (APISO): 63 U/L (ref 37–153)
BUN: 25 mg/dL (ref 7–25)
CO2: 29 mmol/L (ref 20–32)
Calcium: 9.6 mg/dL (ref 8.6–10.4)
Chloride: 103 mmol/L (ref 98–110)
Creat: 0.62 mg/dL (ref 0.50–1.05)
GFR, Est African American: 121 mL/min/{1.73_m2} (ref >=60)
GFR, Est Non African American: 104 mL/min/{1.73_m2} (ref >=60)
Globulin: 2.7 g/dL (calc) (ref 1.9–3.7)
Glucose, Bld: 96 mg/dL (ref 65–99)
Potassium: 4.3 mmol/L (ref 3.5–5.3)
Sodium: 140 mmol/L (ref 135–146)
Total Bilirubin: 0.5 mg/dL (ref 0.2–1.2)
Total Protein: 7.1 g/dL (ref 6.1–8.1)

## 2020-01-01 LAB — RPR: RPR Ser Ql: NONREACTIVE

## 2020-01-01 LAB — HIV ANTIBODY (ROUTINE TESTING W REFLEX): HIV 1&2 Ab, 4th Generation: NONREACTIVE

## 2020-01-01 NOTE — Progress Notes (Signed)
All labs are normal. 

## 2020-01-03 ENCOUNTER — Encounter: Payer: Self-pay | Admitting: Family Medicine

## 2020-01-21 ENCOUNTER — Encounter: Payer: Self-pay | Admitting: Family Medicine

## 2020-01-21 ENCOUNTER — Ambulatory Visit (INDEPENDENT_AMBULATORY_CARE_PROVIDER_SITE_OTHER): Payer: 59 | Admitting: Family Medicine

## 2020-01-21 VITALS — BP 110/68 | HR 71 | Ht 67.0 in | Wt 238.0 lb

## 2020-01-21 DIAGNOSIS — I10 Essential (primary) hypertension: Secondary | ICD-10-CM | POA: Diagnosis not present

## 2020-01-21 DIAGNOSIS — F332 Major depressive disorder, recurrent severe without psychotic features: Secondary | ICD-10-CM | POA: Diagnosis not present

## 2020-01-21 DIAGNOSIS — F902 Attention-deficit hyperactivity disorder, combined type: Secondary | ICD-10-CM | POA: Diagnosis not present

## 2020-01-21 DIAGNOSIS — F411 Generalized anxiety disorder: Secondary | ICD-10-CM

## 2020-01-21 MED ORDER — AMPHETAMINE-DEXTROAMPHET ER 30 MG PO CP24
30.0000 mg | ORAL_CAPSULE | ORAL | 0 refills | Status: DC
Start: 2020-02-20 — End: 2020-06-09

## 2020-01-21 MED ORDER — VENLAFAXINE HCL ER 150 MG PO CP24: 150.0000 mg | ORAL_CAPSULE | Freq: Every day | ORAL | 1 refills | Status: DC

## 2020-01-21 MED ORDER — AMPHETAMINE-DEXTROAMPHET ER 30 MG PO CP24
30.0000 mg | ORAL_CAPSULE | ORAL | 0 refills | Status: DC
Start: 2020-03-21 — End: 2020-06-09

## 2020-01-21 MED ORDER — AMPHETAMINE-DEXTROAMPHET ER 30 MG PO CP24
30.0000 mg | ORAL_CAPSULE | ORAL | 0 refills | Status: DC
Start: 2020-01-21 — End: 2020-06-09

## 2020-01-21 MED ORDER — AMPHETAMINE-DEXTROAMPHETAMINE 10 MG PO TABS
10.0000 mg | ORAL_TABLET | Freq: Every day | ORAL | 0 refills | Status: DC
Start: 2020-01-21 — End: 2020-02-29

## 2020-01-21 NOTE — Assessment & Plan Note (Signed)
Stable on current regimen.  Follow-up in 4 months.

## 2020-01-21 NOTE — Assessment & Plan Note (Signed)
Well controlled. Continue current regimen. Follow up in  6 mo  

## 2020-01-21 NOTE — Progress Notes (Addendum)
Established Patient Office Visit  Subjective:  Patient ID: Andrea Marquez, female    DOB: 1970/11/17  Age: 49 y.o. MRN: [id number]  CC:  Chief Complaint  Patient presents with  . ADHD         HPI   Andrea Marquez presents for   ADHD - Reports symptoms are well controlled on current regime. Denies any problems with insomnia, chest pain, palpitations, or SOB.    F/U Depression -she is actually doing well on the 150 mg plus a 37.5 mg extended release Effexor.  She feels like it is actually pretty good balance in regards to mood.  Things were little bit hectic around the holidays but she is actually doing well.  Plans on getting on track with her diet and working on losing some weight.  She has gained a little bit of weight over the holidays.  She also feels like she is retaining a little bit of fluid today.  Hypertension- Pt denies chest pain, SOB, dizziness, or heart palpitations.  Taking meds as directed w/o problems.  Denies medication side effects.     Persistent tongue lesion and she did go see the oral surgeon and had a biopsy performed everything checked out normally it is now just in the healing phase.   Past Medical History:  Diagnosis Date  . ADHD   . Anxiety   . Atrial tachycardia ([place])   . Back pain   . Depression    severe  . Female infertility   . GERD (gastroesophageal reflux disease)   . Hyperlipidemia   . Hypertension   . Obesity   . Palpitations   . PONV (postoperative nausea and vomiting)   . Prediabetes   . Severe anxiety   . Vertigo     Past Surgical History:  Procedure Laterality Date  . AUGMENTATION MAMMAPLASTY    . BREAST ENHANCEMENT SURGERY    . LAPAROSCOPY    . NSVD     x2  . OOPHORECTOMY     Single   . TOTAL VAGINAL HYSTERECTOMY    . TUBAL LIGATION      Family History  Problem Relation Age of Onset  . COPD Father   . Atrial fibrillation Father   . Diabetes Father   . Hypertension Father   . Depression Father   . Anxiety  disorder Father   . Obesity Father   . Esophageal cancer Maternal Grandfather   . Hypertension Mother   . Skin cancer Mother   . Depression Mother   . Anxiety disorder Mother   . Bipolar disorder Mother   . Drug abuse Mother   . Obesity Mother   . Colon cancer Neg Hx   . Colon polyps Neg Hx   . Rectal cancer Neg Hx   . Stomach cancer Neg Hx     Social History   Socioeconomic History  . Marital status: Married    Spouse name: Andrea Marquez  . Number of children: 2  . Years of education: Not on file  . Highest education level: Not on file  Occupational History  . Occupation: Work from [employer]  . Smoking status: Never Smoker  . Smokeless tobacco: Never Used  Substance and Sexual Activity  . Alcohol use: Yes    Alcohol/week: 0.0 standard drinks    Comment: socially on weekends   . Drug use: No  . Sexual activity: Never  Other Topics Concern  . Not on file  Social History Narrative  Daily caffeine. Works on regularly.    Social Determinants of Health   Financial Resource Strain: Not on file  Food Insecurity: Not on file  Transportation Needs: Not on file  Physical Activity: Not on file  Stress: Not on file  Social Connections: Not on file  Intimate Partner Violence: Not on file    Outpatient Medications Prior to Visit  Medication Sig Dispense Refill  . hydrOXYzine (ATARAX/VISTARIL) 25 MG tablet TAKE 1 TABLET (25 MG TOTAL) BY MOUTH 3 (THREE) TIMES DAILY AS NEEDED FOR ANXIETY. 90 tablet 2  . lisinopril-hydrochlorothiazide (ZESTORETIC) 20-25 MG tablet TAKE 1 TABLET BY MOUTH EVERY DAY 90 tablet 1  . metoprolol succinate (TOPROL-XL) 25 MG 24 hr tablet Take 0.5 tablets (12.5 mg total) by mouth daily. Take with or immediately following a meal. 1 tablet 0  . valACYclovir (VALTREX) 1000 MG tablet Take 2 tablets (2,000 mg total) by mouth 2 (two) times daily. X 1 day. ( 4 tabs per episode) 20 tablet 5  . venlafaxine XR (EFFEXOR-XR) 37.5 MG 24 hr capsule TAKE 1 CAPSULE BY  MOUTH DAILY WITH BREAKFAST. 90 capsule 0  . amphetamine-dextroamphetamine (ADDERALL XR) 30 MG 24 hr capsule Take 1 capsule (30 mg total) by mouth every morning. 30 capsule 0  . amphetamine-dextroamphetamine (ADDERALL XR) 30 MG 24 hr capsule Take 1 capsule (30 mg total) by mouth every morning. 30 capsule 0  . amphetamine-dextroamphetamine (ADDERALL XR) 30 MG 24 hr capsule Take 1 capsule (30 mg total) by mouth every morning. 30 capsule 0  . amphetamine-dextroamphetamine (ADDERALL) 10 MG tablet Take 1 tablet (10 mg total) by mouth daily at 12 noon. 30 tablet 0  . venlafaxine XR (EFFEXOR-XR) 150 MG 24 hr capsule Take 1 capsule (150 mg total) by mouth daily with breakfast. 90 capsule 0  . nystatin (MYCOSTATIN) 100000 UNIT/ML suspension Take 5 mLs (500,000 Units total) by mouth 4 (four) times daily. X 1 week. Swish and hold in mouth for at least 2 minutes and then swallow. 473 mL 0  . triamcinolone (KENALOG) 0.1 % paste Use as directed 1 application in the mouth or throat 2 (two) times daily. Side of tongue 5 g 1   No facility-administered medications prior to visit.    No Known Allergies  ROS Review of Systems    Objective:    Physical Exam Constitutional:      Appearance: She is well-developed and well-nourished.  HENT:     Head: Normocephalic and atraumatic.  Cardiovascular:     Rate and Rhythm: Normal rate and regular rhythm.     Heart sounds: Normal heart sounds.  Pulmonary:     Effort: Pulmonary effort is normal.     Breath sounds: Normal breath sounds.  Skin:    General: Skin is warm and dry.  Neurological:     Mental Status: She is alert and oriented to person, place, and time.  Psychiatric:        Mood and Affect: Mood and affect normal.        Behavior: Behavior normal.     BP 110/68   Pulse 71   Ht 5\' 7"  (1.702 m)   Wt 238 lb (108 kg)   SpO2 95%   BMI 37.28 kg/m  Wt Readings from Last 3 Encounters:  01/21/20 238 lb (108 kg)  12/31/19 234 lb (106.1 kg)  12/24/19  237 lb (107.5 kg)     Health Maintenance Due  Topic Date Due  . Hepatitis C Screening  Never done  There are no preventive care reminders to display for this patient.  Lab Results  Component Value Date   TSH 2.540 06/05/2019   Lab Results  Component Value Date   WBC 5.9 12/31/2019   HGB 14.4 12/31/2019   HCT 41.5 12/31/2019   MCV 92.8 12/31/2019   PLT 320 12/31/2019   Lab Results  Component Value Date   NA 140 12/31/2019   K 4.3 12/31/2019   CO2 29 12/31/2019   GLUCOSE 96 12/31/2019   BUN 25 12/31/2019   CREATININE 0.62 12/31/2019   BILITOT 0.5 12/31/2019   ALKPHOS 74 06/05/2019   AST 13 12/31/2019   ALT 17 12/31/2019   PROT 7.1 12/31/2019   ALBUMIN 4.8 06/05/2019   CALCIUM 9.6 12/31/2019   Lab Results  Component Value Date   CHOL 234 (H) 06/05/2019   Lab Results  Component Value Date   HDL 54 06/05/2019   Lab Results  Component Value Date   LDLCALC 157 (H) 06/05/2019   Lab Results  Component Value Date   TRIG 127 06/05/2019   Lab Results  Component Value Date   CHOLHDL 3.6 06/28/2018   Lab Results  Component Value Date   HGBA1C 5.5 06/05/2019      Assessment & Plan:   Problem List Items Addressed This Visit      Cardiovascular and Mediastinum   Hypertension    Well controlled. Continue current regimen. Follow up in  6  mo        Other   Severe episode of recurrent major depressive disorder, without psychotic features ([place])    Stable on current regimen.  Follow-up in 4 months.      Relevant Medications   venlafaxine XR (EFFEXOR-XR) 150 MG 24 hr capsule   Generalized anxiety disorder    Continue current regimen.  Using Atarax as needed.      Relevant Medications   venlafaxine XR (EFFEXOR-XR) 150 MG 24 hr capsule   Attention deficit hyperactivity disorder (ADHD), combined type - Primary    Able on current regimen.  Refill sent to pharmacy.  Follow-up in 4 months.  Asymptomatic on medication and blood pressure at goal.         Tongue lesion-negative biopsy.  In the healing phase now.   Meds ordered this encounter  Medications  . amphetamine-dextroamphetamine (ADDERALL XR) 30 MG 24 hr capsule    Sig: Take 1 capsule (30 mg total) by mouth every morning.    Dispense:  30 capsule    Refill:  0  . amphetamine-dextroamphetamine (ADDERALL XR) 30 MG 24 hr capsule    Sig: Take 1 capsule (30 mg total) by mouth every morning.    Dispense:  30 capsule    Refill:  0  . amphetamine-dextroamphetamine (ADDERALL XR) 30 MG 24 hr capsule    Sig: Take 1 capsule (30 mg total) by mouth every morning.    Dispense:  30 capsule    Refill:  0  . amphetamine-dextroamphetamine (ADDERALL) 10 MG tablet    Sig: Take 1 tablet (10 mg total) by mouth daily at 12 noon.    Dispense:  30 tablet    Refill:  0  . venlafaxine XR (EFFEXOR-XR) 150 MG 24 hr capsule    Sig: Take 1 capsule (150 mg total) by mouth daily with breakfast.    Dispense:  90 capsule    Refill:  1    Follow-up: Return in about 4 months (around 05/21/2020) for ADHD  and  medications. Marland Kitchen  Beatrice Lecher, MD

## 2020-01-21 NOTE — Assessment & Plan Note (Signed)
Able on current regimen.  Refill sent to pharmacy.  Follow-up in 4 months.  Asymptomatic on medication and blood pressure at goal.

## 2020-01-21 NOTE — Assessment & Plan Note (Signed)
Continue current regimen.  Using Atarax as needed.

## 2020-01-25 ENCOUNTER — Other Ambulatory Visit: Payer: Self-pay | Admitting: Family Medicine

## 2020-02-29 ENCOUNTER — Other Ambulatory Visit: Payer: Self-pay | Admitting: Family Medicine

## 2020-03-01 ENCOUNTER — Other Ambulatory Visit: Payer: Self-pay | Admitting: Family Medicine

## 2020-03-01 DIAGNOSIS — F332 Major depressive disorder, recurrent severe without psychotic features: Secondary | ICD-10-CM

## 2020-03-06 MED ORDER — AMPHETAMINE-DEXTROAMPHETAMINE 10 MG PO TABS: 10.0000 mg | ORAL_TABLET | Freq: Every day | ORAL | 0 refills | Status: DC

## 2020-03-06 MED ORDER — VENLAFAXINE HCL ER 37.5 MG PO CP24: ORAL_CAPSULE | ORAL | 0 refills | Status: DC

## 2020-03-06 NOTE — Telephone Encounter (Signed)
Last written 01/21/2020 #30 no refills Last appt same day

## 2020-03-06 NOTE — Telephone Encounter (Signed)
Has refills post dated for 02/20/2020, 03/21/2020

## 2020-03-06 NOTE — Telephone Encounter (Signed)
My chart note sent regarding the fact that she should still have a refill for the end of February for her extended release Adderall.  The short acting was sent.

## 2020-04-10 ENCOUNTER — Other Ambulatory Visit: Payer: Self-pay | Admitting: Family Medicine

## 2020-04-10 MED ORDER — AMPHETAMINE-DEXTROAMPHETAMINE 10 MG PO TABS: 10.0000 mg | ORAL_TABLET | Freq: Every day | ORAL | 0 refills | Status: DC

## 2020-04-11 ENCOUNTER — Encounter: Payer: Self-pay | Admitting: Family Medicine

## 2020-04-11 ENCOUNTER — Ambulatory Visit: Payer: 59 | Admitting: Family Medicine

## 2020-04-11 ENCOUNTER — Other Ambulatory Visit: Payer: Self-pay

## 2020-04-11 VITALS — BP 121/70 | HR 77 | Ht 67.0 in | Wt 246.0 lb

## 2020-04-11 DIAGNOSIS — F430 Acute stress reaction: Secondary | ICD-10-CM | POA: Diagnosis not present

## 2020-04-11 MED ORDER — CLONAZEPAM 0.5 MG PO TABS: 0.5000 mg | ORAL_TABLET | Freq: Two times a day (BID) | ORAL | 0 refills | Status: DC | PRN

## 2020-04-11 NOTE — Progress Notes (Signed)
Established Patient Office Visit  Subjective:  Patient ID: Andrea Marquez, female    DOB: 1970/03/16  Age: 50 y.o. MRN: [id number]  CC:  Chief Complaint  Patient presents with  . Depression  . Anxiety    HPI Andrea Marquez presents to discuss mood.  She has been under a lot of stress recently.  Unfortunately her daughter was really experiencing some significant mood symptoms.  And it eventually resulted in her going to the hospital and then being transferred to an inpatient mental health facility.  Andrea Marquez is here today because she is feeling very stressed and overwhelmed she herself has felt down and depressed and nearly hopeless every day and feels very anxious on edge several days of the week.  She has had thoughts of being better off dead or just wanting to leave the situation but no active thoughts of wanting to harm herself.  She rates her symptoms as extremely difficult.  She just feels like she is caring a lot of blame for what went on and is having a hard time feeling like she cannot connect with her daughter who she feels is her world.  This has been can incredibly distressful.  And has led to poor energy and sleep quality as well as feeling like she is overeating.    Past Medical History:  Diagnosis Date  . ADHD   . Anxiety   . Atrial tachycardia ([place])   . Back pain   . Depression    severe  . Female infertility   . GERD (gastroesophageal reflux disease)   . Hyperlipidemia   . Hypertension   . Obesity   . Palpitations   . PONV (postoperative nausea and vomiting)   . Prediabetes   . Severe anxiety   . Vertigo     Past Surgical History:  Procedure Laterality Date  . AUGMENTATION MAMMAPLASTY    . BREAST ENHANCEMENT SURGERY    . LAPAROSCOPY    . NSVD     x2  . OOPHORECTOMY     Single   . TOTAL VAGINAL HYSTERECTOMY    . TUBAL LIGATION      Family History  Problem Relation Age of Onset  . COPD Father   . Atrial fibrillation Father   . Diabetes Father   .  Hypertension Father   . Depression Father   . Anxiety disorder Father   . Obesity Father   . Esophageal cancer Maternal Grandfather   . Hypertension Mother   . Skin cancer Mother   . Depression Mother   . Anxiety disorder Mother   . Bipolar disorder Mother   . Drug abuse Mother   . Obesity Mother   . Colon cancer Neg Hx   . Colon polyps Neg Hx   . Rectal cancer Neg Hx   . Stomach cancer Neg Hx     Social History   Socioeconomic History  . Marital status: Married    Spouse name: Randall Hiss  . Number of children: 2  . Years of education: Not on file  . Highest education level: Not on file  Occupational History  . Occupation: Work from [employer]  . Smoking status: Never Smoker  . Smokeless tobacco: Never Used  Substance and Sexual Activity  . Alcohol use: Yes    Alcohol/week: 0.0 standard drinks    Comment: socially on weekends   . Drug use: No  . Sexual activity: Never  Other Topics Concern  . Not on file  Social History Narrative   Daily caffeine. Works on regularly.    Social Determinants of Health   Financial Resource Strain: Not on file  Food Insecurity: Not on file  Transportation Needs: Not on file  Physical Activity: Not on file  Stress: Not on file  Social Connections: Not on file  Intimate Partner Violence: Not on file    Outpatient Medications Prior to Visit  Medication Sig Dispense Refill  . amphetamine-dextroamphetamine (ADDERALL XR) 30 MG 24 hr capsule Take 1 capsule (30 mg total) by mouth every morning. 30 capsule 0  . amphetamine-dextroamphetamine (ADDERALL XR) 30 MG 24 hr capsule Take 1 capsule (30 mg total) by mouth every morning. 30 capsule 0  . amphetamine-dextroamphetamine (ADDERALL XR) 30 MG 24 hr capsule Take 1 capsule (30 mg total) by mouth every morning. 30 capsule 0  . amphetamine-dextroamphetamine (ADDERALL) 10 MG tablet Take 1 tablet (10 mg total) by mouth daily at 12 noon. 30 tablet 0  . hydrOXYzine (ATARAX/VISTARIL) 25 MG tablet  TAKE 1 TABLET (25 MG TOTAL) BY MOUTH 3 (THREE) TIMES DAILY AS NEEDED FOR ANXIETY. 270 tablet 1  . lisinopril-hydrochlorothiazide (ZESTORETIC) 20-25 MG tablet TAKE 1 TABLET BY MOUTH EVERY DAY 90 tablet 1  . metoprolol succinate (TOPROL-XL) 25 MG 24 hr tablet TAKE 1 TABLET (25 MG TOTAL) BY MOUTH DAILY. TAKE WITH OR IMMEDIATELY FOLLOWING A MEAL. 90 tablet 1  . valACYclovir (VALTREX) 1000 MG tablet Take 2 tablets (2,000 mg total) by mouth 2 (two) times daily. X 1 day. ( 4 tabs per episode) 20 tablet 5  . venlafaxine XR (EFFEXOR-XR) 150 MG 24 hr capsule Take 1 capsule (150 mg total) by mouth daily with breakfast. 90 capsule 1  . venlafaxine XR (EFFEXOR-XR) 37.5 MG 24 hr capsule TAKE 1 CAPSULE BY MOUTH DAILY WITH BREAKFAST. 90 capsule 0   No facility-administered medications prior to visit.    No Known Allergies  ROS Review of Systems    Objective:    Physical Exam  BP 121/70   Pulse 77   Ht 5\' 7"  (1.702 m)   Wt 246 lb (111.6 kg)   SpO2 99%   BMI 38.53 kg/m  Wt Readings from Last 3 Encounters:  04/11/20 246 lb (111.6 kg)  01/21/20 238 lb (108 kg)  12/31/19 234 lb (106.1 kg)     Health Maintenance Due  Topic Date Due  . Hepatitis C Screening  Never done    There are no preventive care reminders to display for this patient.  Lab Results  Component Value Date   TSH 2.540 06/05/2019   Lab Results  Component Value Date   WBC 5.9 12/31/2019   HGB 14.4 12/31/2019   HCT 41.5 12/31/2019   MCV 92.8 12/31/2019   PLT 320 12/31/2019   Lab Results  Component Value Date   NA 140 12/31/2019   K 4.3 12/31/2019   CO2 29 12/31/2019   GLUCOSE 96 12/31/2019   BUN 25 12/31/2019   CREATININE 0.62 12/31/2019   BILITOT 0.5 12/31/2019   ALKPHOS 74 06/05/2019   AST 13 12/31/2019   ALT 17 12/31/2019   PROT 7.1 12/31/2019   ALBUMIN 4.8 06/05/2019   CALCIUM 9.6 12/31/2019   Lab Results  Component Value Date   CHOL 234 (H) 06/05/2019   Lab Results  Component Value Date   HDL 54  06/05/2019   Lab Results  Component Value Date   LDLCALC 157 (H) 06/05/2019   Lab Results  Component Value Date   TRIG  127 06/05/2019   Lab Results  Component Value Date   CHOLHDL 3.6 06/28/2018   Lab Results  Component Value Date   HGBA1C 5.5 06/05/2019      Assessment & Plan:   Problem List Items Addressed This Visit      Other   Acute stress reaction - Primary    Discuss strategies including considering getting in with a therapist or counselor to help her through this really tough time.  Her PHQ-9 score was 16 and GAD-7 score was 14 and she rates symptoms as extremely difficult.  We also discussed whether or not to adjust her medication she is currently on 150 mg +37.5 mg of Effexor and is actually been doing really well on this dose up until this recent event.  We discussed continuing at her current regimen because I do feel like it is effective for her.  I feel like a lot of this is situational and so we discussed giving her a small quantity of clonazepam to use very sparingly.  I did discuss that it can be habit-forming so again which is really 1 to minimize the use for when she is feeling stressed and overwhelmed and most importantly I really want to get her in with a therapist or counselor to discuss what is going on..      Relevant Orders   Ambulatory referral to [place]     Follow-up in 2 weeks to make sure that she is doing okay.  Meds ordered this encounter  Medications  . clonazePAM (KLONOPIN) 0.5 MG tablet    Sig: Take 1 tablet (0.5 mg total) by mouth 2 (two) times daily as needed for anxiety.    Dispense:  20 tablet    Refill:  0    Follow-up: Return in about 2 weeks (around 04/25/2020) for New start medication.   I spent 35 minutes on the day of the encounter to include pre-visit record review, face-to-face time with the patient and post visit ordering of test.   Beatrice Lecher, MD

## 2020-04-16 ENCOUNTER — Encounter: Payer: Self-pay | Admitting: Family Medicine

## 2020-04-16 DIAGNOSIS — F430 Acute stress reaction: Secondary | ICD-10-CM | POA: Insufficient documentation

## 2020-04-16 NOTE — Assessment & Plan Note (Signed)
Discuss strategies including considering getting in with a therapist or counselor to help her through this really tough time.  Her PHQ-9 score was 16 and GAD-7 score was 14 and she rates symptoms as extremely difficult.  We also discussed whether or not to adjust her medication she is currently on 150 mg +37.5 mg of Effexor and is actually been doing really well on this dose up until this recent event.  We discussed continuing at her current regimen because I do feel like it is effective for her.  I feel like a lot of this is situational and so we discussed giving her a small quantity of clonazepam to use very sparingly.  I did discuss that it can be habit-forming so again which is really 1 to minimize the use for when she is feeling stressed and overwhelmed and most importantly I really want to get her in with a therapist or counselor to discuss what is going on.Marland Kitchen

## 2020-04-29 ENCOUNTER — Ambulatory Visit (INDEPENDENT_AMBULATORY_CARE_PROVIDER_SITE_OTHER): Payer: 59 | Admitting: Family Medicine

## 2020-04-29 ENCOUNTER — Other Ambulatory Visit: Payer: Self-pay

## 2020-04-29 ENCOUNTER — Encounter: Payer: Self-pay | Admitting: Family Medicine

## 2020-04-29 VITALS — BP 116/83 | HR 78 | Ht 67.0 in | Wt 252.0 lb

## 2020-04-29 DIAGNOSIS — F430 Acute stress reaction: Secondary | ICD-10-CM | POA: Diagnosis not present

## 2020-04-29 NOTE — Progress Notes (Signed)
Established Patient Office Visit  Subjective:  Patient ID: Andrea Marquez, female    DOB: Mar 03, 1970  Age: 50 y.o. MRN: [id number]  CC:  Chief Complaint  Patient presents with  . Follow-up    HPI Andrea Marquez presents for her acute stress reaction-overall she does feel like she is a little bit better place than when I last saw her.  She did try to get a referral with [facility] but unfortunately was not covered with her insurance for therapy/counseling they referred her to someone else and they have been playing phone tag but she does need to call them back to get scheduled.  She is recently started going back to the gym and feels like that is been helpful to help reduce her stress levels.  Her daughter is now back home with her as well as her daughter's boyfriend.  There continues to be some major conflict and stresses there.  Her daughter is not interested in getting a job or working and continue to smoke pot.    Past Medical History:  Diagnosis Date  . ADHD   . Anxiety   . Atrial tachycardia ([place])   . Back pain   . Depression    severe  . Female infertility   . GERD (gastroesophageal reflux disease)   . Hyperlipidemia   . Hypertension   . Obesity   . Palpitations   . PONV (postoperative nausea and vomiting)   . Prediabetes   . Severe anxiety   . Vertigo     Past Surgical History:  Procedure Laterality Date  . AUGMENTATION MAMMAPLASTY    . BREAST ENHANCEMENT SURGERY    . LAPAROSCOPY    . NSVD     x2  . OOPHORECTOMY     Single   . TOTAL VAGINAL HYSTERECTOMY    . TUBAL LIGATION      Family History  Problem Relation Age of Onset  . COPD Father   . Atrial fibrillation Father   . Diabetes Father   . Hypertension Father   . Depression Father   . Anxiety disorder Father   . Obesity Father   . Esophageal cancer Maternal Grandfather   . Hypertension Mother   . Skin cancer Mother   . Depression Mother   . Anxiety disorder Mother   . Bipolar disorder  Mother   . Drug abuse Mother   . Obesity Mother   . Colon cancer Neg Hx   . Colon polyps Neg Hx   . Rectal cancer Neg Hx   . Stomach cancer Neg Hx     Social History   Socioeconomic History  . Marital status: Married    Spouse name: Randall Hiss  . Number of children: 2  . Years of education: Not on file  . Highest education level: Not on file  Occupational History  . Occupation: Work from [employer]  . Smoking status: Never Smoker  . Smokeless tobacco: Never Used  Substance and Sexual Activity  . Alcohol use: Yes    Alcohol/week: 0.0 standard drinks    Comment: socially on weekends   . Drug use: No  . Sexual activity: Never  Other Topics Concern  . Not on file  Social History Narrative   Daily caffeine. Works on regularly.    Social Determinants of Health   Financial Resource Strain: Not on file  Food Insecurity: Not on file  Transportation Needs: Not on file  Physical Activity: Not on file  Stress: Not  on file  Social Connections: Not on file  Intimate Partner Violence: Not on file    Outpatient Medications Prior to Visit  Medication Sig Dispense Refill  . amphetamine-dextroamphetamine (ADDERALL XR) 30 MG 24 hr capsule Take 1 capsule (30 mg total) by mouth every morning. 30 capsule 0  . amphetamine-dextroamphetamine (ADDERALL XR) 30 MG 24 hr capsule Take 1 capsule (30 mg total) by mouth every morning. 30 capsule 0  . amphetamine-dextroamphetamine (ADDERALL XR) 30 MG 24 hr capsule Take 1 capsule (30 mg total) by mouth every morning. 30 capsule 0  . amphetamine-dextroamphetamine (ADDERALL) 10 MG tablet Take 1 tablet (10 mg total) by mouth daily at 12 noon. 30 tablet 0  . clonazePAM (KLONOPIN) 0.5 MG tablet Take 1 tablet (0.5 mg total) by mouth 2 (two) times daily as needed for anxiety. 20 tablet 0  . hydrOXYzine (ATARAX/VISTARIL) 25 MG tablet TAKE 1 TABLET (25 MG TOTAL) BY MOUTH 3 (THREE) TIMES DAILY AS NEEDED FOR ANXIETY. 270 tablet 1  .  lisinopril-hydrochlorothiazide (ZESTORETIC) 20-25 MG tablet TAKE 1 TABLET BY MOUTH EVERY DAY 90 tablet 1  . metoprolol succinate (TOPROL-XL) 25 MG 24 hr tablet TAKE 1 TABLET (25 MG TOTAL) BY MOUTH DAILY. TAKE WITH OR IMMEDIATELY FOLLOWING A MEAL. 90 tablet 1  . valACYclovir (VALTREX) 1000 MG tablet Take 2 tablets (2,000 mg total) by mouth 2 (two) times daily. X 1 day. ( 4 tabs per episode) 20 tablet 5  . venlafaxine XR (EFFEXOR-XR) 150 MG 24 hr capsule Take 1 capsule (150 mg total) by mouth daily with breakfast. 90 capsule 1  . venlafaxine XR (EFFEXOR-XR) 37.5 MG 24 hr capsule TAKE 1 CAPSULE BY MOUTH DAILY WITH BREAKFAST. 90 capsule 0   No facility-administered medications prior to visit.    No Known Allergies  ROS Review of Systems    Objective:    Physical Exam Constitutional:      Appearance: She is well-developed.  HENT:     Head: Normocephalic and atraumatic.  Cardiovascular:     Rate and Rhythm: Normal rate and regular rhythm.     Heart sounds: Normal heart sounds.  Pulmonary:     Effort: Pulmonary effort is normal.     Breath sounds: Normal breath sounds.  Skin:    General: Skin is warm and dry.  Neurological:     Mental Status: She is alert and oriented to person, place, and time.  Psychiatric:        Behavior: Behavior normal.     BP 116/83   Pulse 78   Ht 5\' 7"  (1.702 m)   Wt 252 lb (114.3 kg)   SpO2 100%   BMI 39.47 kg/m  Wt Readings from Last 3 Encounters:  04/29/20 252 lb (114.3 kg)  04/11/20 246 lb (111.6 kg)  01/21/20 238 lb (108 kg)     Health Maintenance Due  Topic Date Due  . Hepatitis C Screening  Never done    There are no preventive care reminders to display for this patient.  Lab Results  Component Value Date   TSH 2.540 06/05/2019   Lab Results  Component Value Date   WBC 5.9 12/31/2019   HGB 14.4 12/31/2019   HCT 41.5 12/31/2019   MCV 92.8 12/31/2019   PLT 320 12/31/2019   Lab Results  Component Value Date   NA 140  12/31/2019   K 4.3 12/31/2019   CO2 29 12/31/2019   GLUCOSE 96 12/31/2019   BUN 25 12/31/2019   CREATININE 0.62 12/31/2019  BILITOT 0.5 12/31/2019   ALKPHOS 74 06/05/2019   AST 13 12/31/2019   ALT 17 12/31/2019   PROT 7.1 12/31/2019   ALBUMIN 4.8 06/05/2019   CALCIUM 9.6 12/31/2019   Lab Results  Component Value Date   CHOL 234 (H) 06/05/2019   Lab Results  Component Value Date   HDL 54 06/05/2019   Lab Results  Component Value Date   LDLCALC 157 (H) 06/05/2019   Lab Results  Component Value Date   TRIG 127 06/05/2019   Lab Results  Component Value Date   CHOLHDL 3.6 06/28/2018   Lab Results  Component Value Date   HGBA1C 5.5 06/05/2019      Assessment & Plan:   Problem List Items Addressed This Visit      Other   Acute stress reaction - Primary    PHQ score of 13 and GAD 7 score of 13. Will continue current regimen for now.  Continue to use the the clonazepam sparingly. She still has plenty of tabs left. Did encourage her to call the psychologist and get scheduled. I really want her to have someone who is objective who can help her through those really tough decision.           No orders of the defined types were placed in this encounter.  I spent 30 minutes on the day of the encounter to include pre-visit record review, face-to-face time with the patient and post visit ordering of test.   Follow-up: Return in about 6 weeks (around 06/10/2020) for Medication .    Beatrice Lecher, MD

## 2020-04-29 NOTE — Assessment & Plan Note (Signed)
PHQ score of 13 and GAD 7 score of 13. Will continue current regimen for now.  Continue to use the the clonazepam sparingly. She still has plenty of tabs left. Did encourage her to call the psychologist and get scheduled. I really want her to have someone who is objective who can help her through those really tough decision.

## 2020-04-30 ENCOUNTER — Encounter: Payer: Self-pay | Admitting: Family Medicine

## 2020-05-28 ENCOUNTER — Encounter: Payer: Self-pay | Admitting: Family Medicine

## 2020-05-29 ENCOUNTER — Other Ambulatory Visit: Payer: Self-pay | Admitting: Family Medicine

## 2020-05-30 MED ORDER — QSYMIA 7.5-46 MG PO CP24: 1.0000 | ORAL_CAPSULE | Freq: Every morning | ORAL | 0 refills | Status: DC

## 2020-05-30 MED ORDER — QSYMIA 3.75-23 MG PO CP24: 1.0000 | ORAL_CAPSULE | Freq: Every morning | ORAL | 0 refills | Status: DC

## 2020-05-30 NOTE — Telephone Encounter (Signed)
Med sent. Needs f/u in 4 weeks in person.  Monitor for palpitations and chest pain

## 2020-06-09 ENCOUNTER — Other Ambulatory Visit: Payer: Self-pay | Admitting: Family Medicine

## 2020-06-09 MED ORDER — AMPHETAMINE-DEXTROAMPHET ER 30 MG PO CP24: 30.0000 mg | ORAL_CAPSULE | ORAL | 0 refills | Status: DC

## 2020-06-09 MED ORDER — AMPHETAMINE-DEXTROAMPHETAMINE 10 MG PO TABS: 10.0000 mg | ORAL_TABLET | Freq: Every day | ORAL | 0 refills | Status: DC

## 2020-06-09 NOTE — Telephone Encounter (Signed)
Meds sent

## 2020-06-11 ENCOUNTER — Other Ambulatory Visit: Payer: Self-pay | Admitting: *Deleted

## 2020-06-11 MED ORDER — AMPHETAMINE-DEXTROAMPHETAMINE 10 MG PO TABS: 10.0000 mg | ORAL_TABLET | Freq: Every day | ORAL | 0 refills | Status: DC

## 2020-06-11 NOTE — Telephone Encounter (Signed)
Spoke w/pt she is asking for a refill on the short acting adderall 10 mg . She ran out of this and understands that she is due for a f/u appt. Pt was transferred to scheduler for appt.   She also asked about the status of the PA for the Qsymia I informed her that this can take up to 2+ weeks to get an approval and that we are short staffed and could not say how much longer this would take. She voiced understanding.

## 2020-06-19 ENCOUNTER — Other Ambulatory Visit: Payer: Self-pay

## 2020-06-19 ENCOUNTER — Ambulatory Visit (INDEPENDENT_AMBULATORY_CARE_PROVIDER_SITE_OTHER): Payer: 59 | Admitting: Family Medicine

## 2020-06-19 ENCOUNTER — Encounter: Payer: Self-pay | Admitting: Family Medicine

## 2020-06-19 VITALS — BP 107/68 | HR 97 | Ht 67.0 in | Wt 246.0 lb

## 2020-06-19 DIAGNOSIS — F902 Attention-deficit hyperactivity disorder, combined type: Secondary | ICD-10-CM | POA: Diagnosis not present

## 2020-06-19 DIAGNOSIS — Z7689 Persons encountering health services in other specified circumstances: Secondary | ICD-10-CM

## 2020-06-19 DIAGNOSIS — Z6836 Body mass index (BMI) 36.0-36.9, adult: Secondary | ICD-10-CM

## 2020-06-19 MED ORDER — TOPIRAMATE 25 MG PO TABS: 25.0000 mg | ORAL_TABLET | Freq: Two times a day (BID) | ORAL | 1 refills | Status: DC

## 2020-06-19 MED ORDER — PHENTERMINE HCL 15 MG PO CAPS: 15.0000 mg | ORAL_CAPSULE | ORAL | 0 refills | Status: DC

## 2020-06-19 NOTE — Progress Notes (Signed)
Pt reports that she still hasn't been able to get the Qsymia filled from the very 1st time that it was prescribed for her. She called the insurance company and they informed her that they were waiting for our office to complete an authorization to send back to them for approval.

## 2020-06-19 NOTE — Progress Notes (Signed)
Established Patient Office Visit  Subjective:  Patient ID: Andrea Marquez, female    DOB: Jun 04, 1970  Age: 50 y.o. MRN: [id number]  CC:  Chief Complaint  Patient presents with  . ADHD    HPI [name] presents for   Weight management-she is just go see Dr. Redgie Grayer.  We had recently tried to write a prescription for Qsymia but were having trouble getting it covered with the insurance that requires prior authorization and she had reached out to [place] about that we will check and see whether or not it was initiated.  She said she heard that they could be split apart and could come up with something similar.  She is currently getting ready to start-meal prep and is hoping that that will help she already exercises regularly though admits she has not been as consistent in the last few weeks.  Previously her daughter was going with her.  ADHD - Reports symptoms are well controlled on current regime. Denies any problems with insomnia, chest pain, palpitations, or SOB.     Past Medical History:  Diagnosis Date  . ADHD   . Anxiety   . Atrial tachycardia ([place])   . Back pain   . Depression    severe  . Female infertility   . GERD (gastroesophageal reflux disease)   . Hyperlipidemia   . Hypertension   . Obesity   . Palpitations   . PONV (postoperative nausea and vomiting)   . Prediabetes   . Severe anxiety   . Vertigo     Past Surgical History:  Procedure Laterality Date  . AUGMENTATION MAMMAPLASTY    . BREAST ENHANCEMENT SURGERY    . LAPAROSCOPY    . NSVD     x2  . OOPHORECTOMY     Single   . TOTAL VAGINAL HYSTERECTOMY    . TUBAL LIGATION      Family History  Problem Relation Age of Onset  . COPD Father   . Atrial fibrillation Father   . Diabetes Father   . Hypertension Father   . Depression Father   . Anxiety disorder Father   . Obesity Father   . Esophageal cancer Maternal Grandfather   . Hypertension Mother   . Skin cancer Mother   . Depression  Mother   . Anxiety disorder Mother   . Bipolar disorder Mother   . Drug abuse Mother   . Obesity Mother   . Colon cancer Neg Hx   . Colon polyps Neg Hx   . Rectal cancer Neg Hx   . Stomach cancer Neg Hx     Social History   Socioeconomic History  . Marital status: Married    Spouse name: Randall Hiss  . Number of children: 2  . Years of education: Not on file  . Highest education level: Not on file  Occupational History  . Occupation: Work from [employer]  . Smoking status: Never Smoker  . Smokeless tobacco: Never Used  Substance and Sexual Activity  . Alcohol use: Yes    Alcohol/week: 0.0 standard drinks    Comment: socially on weekends   . Drug use: No  . Sexual activity: Never  Other Topics Concern  . Not on file  Social History Narrative   Daily caffeine. Works on regularly.    Social Determinants of Health   Financial Resource Strain: Not on file  Food Insecurity: Not on file  Transportation Needs: Not on file  Physical Activity: Not  on file  Stress: Not on file  Social Connections: Not on file  Intimate Partner Violence: Not on file    Outpatient Medications Prior to Visit  Medication Sig Dispense Refill  . [START ON 08/07/2020] amphetamine-dextroamphetamine (ADDERALL XR) 30 MG 24 hr capsule Take 1 capsule (30 mg total) by mouth every morning. 30 capsule 0  . [START ON 07/09/2020] amphetamine-dextroamphetamine (ADDERALL XR) 30 MG 24 hr capsule Take 1 capsule (30 mg total) by mouth every morning. 30 capsule 0  . amphetamine-dextroamphetamine (ADDERALL XR) 30 MG 24 hr capsule Take 1 capsule (30 mg total) by mouth every morning. 30 capsule 0  . amphetamine-dextroamphetamine (ADDERALL) 10 MG tablet Take 1 tablet (10 mg total) by mouth daily at 12 noon. 30 tablet 0  . clonazePAM (KLONOPIN) 0.5 MG tablet Take 1 tablet (0.5 mg total) by mouth 2 (two) times daily as needed for anxiety. 20 tablet 0  . hydrOXYzine (ATARAX/VISTARIL) 25 MG tablet TAKE 1 TABLET (25 MG  TOTAL) BY MOUTH 3 (THREE) TIMES DAILY AS NEEDED FOR ANXIETY. 270 tablet 1  . lisinopril-hydrochlorothiazide (ZESTORETIC) 20-25 MG tablet TAKE 1 TABLET BY MOUTH EVERY DAY 90 tablet 1  . metoprolol succinate (TOPROL-XL) 25 MG 24 hr tablet TAKE 1 TABLET BY MOUTH DAILY. TAKE WITH OR IMMEDIATELY FOLLOWING A MEAL. 90 tablet 1  . valACYclovir (VALTREX) 1000 MG tablet Take 2 tablets (2,000 mg total) by mouth 2 (two) times daily. X 1 day. ( 4 tabs per episode) 20 tablet 5  . venlafaxine XR (EFFEXOR-XR) 150 MG 24 hr capsule Take 1 capsule (150 mg total) by mouth daily with breakfast. 90 capsule 1  . venlafaxine XR (EFFEXOR-XR) 37.5 MG 24 hr capsule TAKE 1 CAPSULE BY MOUTH DAILY WITH BREAKFAST. 90 capsule 0  . Phentermine-Topiramate (QSYMIA) 3.75-23 MG CP24 Take 1 tablet by mouth in the morning for 14 days. Then start 7.5 (Patient not taking: Reported on 06/19/2020) 14 capsule 0  . Phentermine-Topiramate (QSYMIA) 7.5-46 MG CP24 Take 1 capsule by mouth in the morning. (Patient not taking: Reported on 06/19/2020) 30 capsule 0   No facility-administered medications prior to visit.    No Known Allergies  ROS Review of Systems    Objective:    Physical Exam  BP 107/68   Pulse 97   Ht 5\' 7"  (1.702 m)   Wt 246 lb (111.6 kg)   SpO2 97%   BMI 38.53 kg/m  Wt Readings from Last 3 Encounters:  06/19/20 246 lb (111.6 kg)  04/29/20 252 lb (114.3 kg)  04/11/20 246 lb (111.6 kg)     Health Maintenance Due  Topic Date Due  . Hepatitis C Screening  Never done  . Zoster Vaccines- Shingrix (1 of 2) Never done  . COVID-19 Vaccine (3 - Booster for Pfizer series) 04/26/2020    There are no preventive care reminders to display for this patient.  Lab Results  Component Value Date   TSH 2.540 06/05/2019   Lab Results  Component Value Date   WBC 5.9 12/31/2019   HGB 14.4 12/31/2019   HCT 41.5 12/31/2019   MCV 92.8 12/31/2019   PLT 320 12/31/2019   Lab Results  Component Value Date   NA 140  12/31/2019   K 4.3 12/31/2019   CO2 29 12/31/2019   GLUCOSE 96 12/31/2019   BUN 25 12/31/2019   CREATININE 0.62 12/31/2019   BILITOT 0.5 12/31/2019   ALKPHOS 74 06/05/2019   AST 13 12/31/2019   ALT 17 12/31/2019   PROT 7.1  12/31/2019   ALBUMIN 4.8 06/05/2019   CALCIUM 9.6 12/31/2019   Lab Results  Component Value Date   CHOL 234 (H) 06/05/2019   Lab Results  Component Value Date   HDL 54 06/05/2019   Lab Results  Component Value Date   LDLCALC 157 (H) 06/05/2019   Lab Results  Component Value Date   TRIG 127 06/05/2019   Lab Results  Component Value Date   CHOLHDL 3.6 06/28/2018   Lab Results  Component Value Date   HGBA1C 5.5 06/05/2019      Assessment & Plan:   Problem List Items Addressed This Visit      Other   Encounter for weight management    We discussed options.  The Qsymia I think is gone to be very difficult to get covered.  So we discussed splitting it apart into the available generics including topiramate and phentermine.  What about potential side effects.  Do want her to follow-up in 4 weeks so that we can check her blood pressure on the medication and also continue to work on [employer].  Visit #: 2 Starting Weight:  Current weight: 246 lb Previous weight: 252 lbs Change in weight: Down 6 lbs Goal weight: Dietary goals: Continue to work on [employer] plans on starting DASH meal prep. Exercise goals: [place] for exercising at least 3 days/week.  Even if her daughter is unable to go. Medication: Topiramate 25 mg twice a day, phentermine 15 mg daily. Follow-up and referrals: 4 weeks.       Relevant Medications   topiramate (TOPAMAX) 25 MG tablet   phentermine 15 MG capsule   Attention deficit hyperactivity disorder (ADHD), combined type - Primary    Well controlled. Continue current regimen. Follow up in  4-6 mo. she should be current on her prescriptions.  I reviewed the updated prescriptions that were previously sent.       Other  Visit Diagnoses    Class 2 severe obesity with serious comorbidity and body mass index (BMI) of 36.0 to 36.9 in adult, unspecified obesity type (HCC)       Relevant Medications   topiramate (TOPAMAX) 25 MG tablet   phentermine 15 MG capsule      Meds ordered this encounter  Medications  . topiramate (TOPAMAX) 25 MG tablet    Sig: Take 1 tablet (25 mg total) by mouth 2 (two) times daily.    Dispense:  60 tablet    Refill:  1  . phentermine 15 MG capsule    Sig: Take 1 capsule (15 mg total) by mouth every morning.    Dispense:  30 capsule    Refill:  0    Follow-up: Return in about 4 weeks (around 07/17/2020) for weight check.    Beatrice Lecher, MD

## 2020-06-20 DIAGNOSIS — Z7689 Persons encountering health services in other specified circumstances: Secondary | ICD-10-CM | POA: Insufficient documentation

## 2020-06-20 NOTE — Assessment & Plan Note (Signed)
We discussed options.  The Qsymia I think is gone to be very difficult to get covered.  So we discussed splitting it apart into the available generics including topiramate and phentermine.  What about potential side effects.  Do want her to follow-up in 4 weeks so that we can check her blood pressure on the medication and also continue to work on [employer].  Visit #: 2 Starting Weight:  Current weight: 246 lb Previous weight: 252 lbs Change in weight: Down 6 lbs Goal weight: Dietary goals: Continue to work on [employer] plans on starting DASH meal prep. Exercise goals: [place]oot for exercising at least 3 days/week.  Even if her daughter is unable to go. Medication: Topiramate 25 mg twice a day, phentermine 15 mg daily. Follow-up and referrals: 4 weeks.

## 2020-06-20 NOTE — Assessment & Plan Note (Addendum)
Well controlled. Continue current regimen. Follow up in  4-6 mo. she should be current on her prescriptions.  I reviewed the updated prescriptions that were previously sent.

## 2020-07-07 ENCOUNTER — Other Ambulatory Visit: Payer: Self-pay | Admitting: Family Medicine

## 2020-07-07 DIAGNOSIS — F332 Major depressive disorder, recurrent severe without psychotic features: Secondary | ICD-10-CM

## 2020-07-15 ENCOUNTER — Other Ambulatory Visit: Payer: Self-pay | Admitting: Family Medicine

## 2020-07-15 DIAGNOSIS — Z6836 Body mass index (BMI) 36.0-36.9, adult: Secondary | ICD-10-CM

## 2020-07-15 DIAGNOSIS — Z7689 Persons encountering health services in other specified circumstances: Secondary | ICD-10-CM

## 2020-07-17 IMAGING — MG DIGITAL SCREENING BREAST BILAT IMPLANT W/ TOMO W/ CAD
8 of 12 series · 8 of 28 positions shown · non-contrast
Comparison: Previous exam(s).

CLINICAL DATA: Screening.

EXAM:
DIGITAL SCREENING BILATERAL MAMMOGRAM WITH IMPLANTS, CAD AND TOMO
The patient has retropectoral implants. Standard and implant
displaced views were performed.

[R MLO]
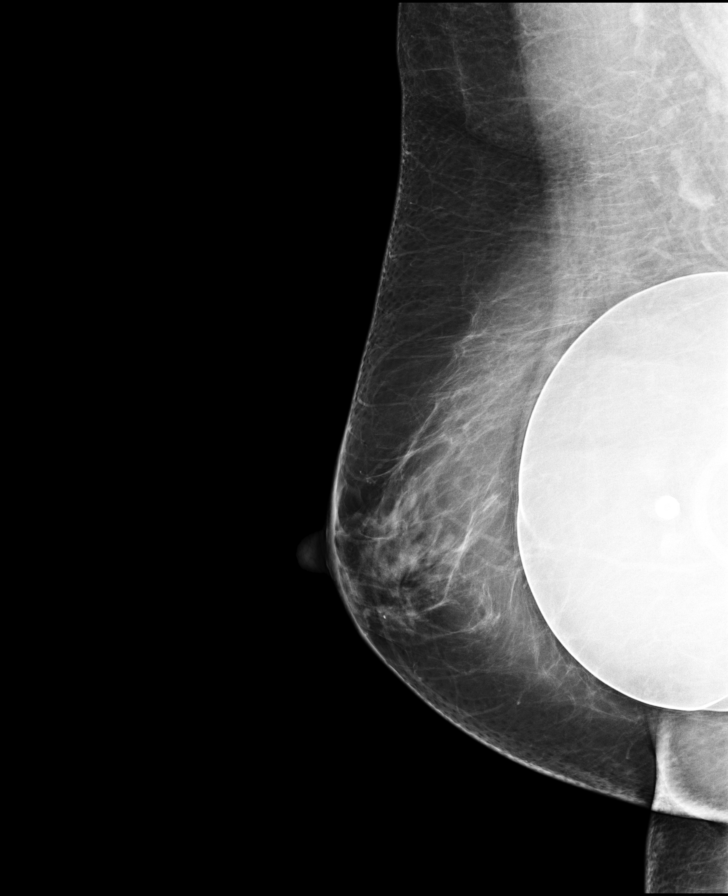

[R CC]
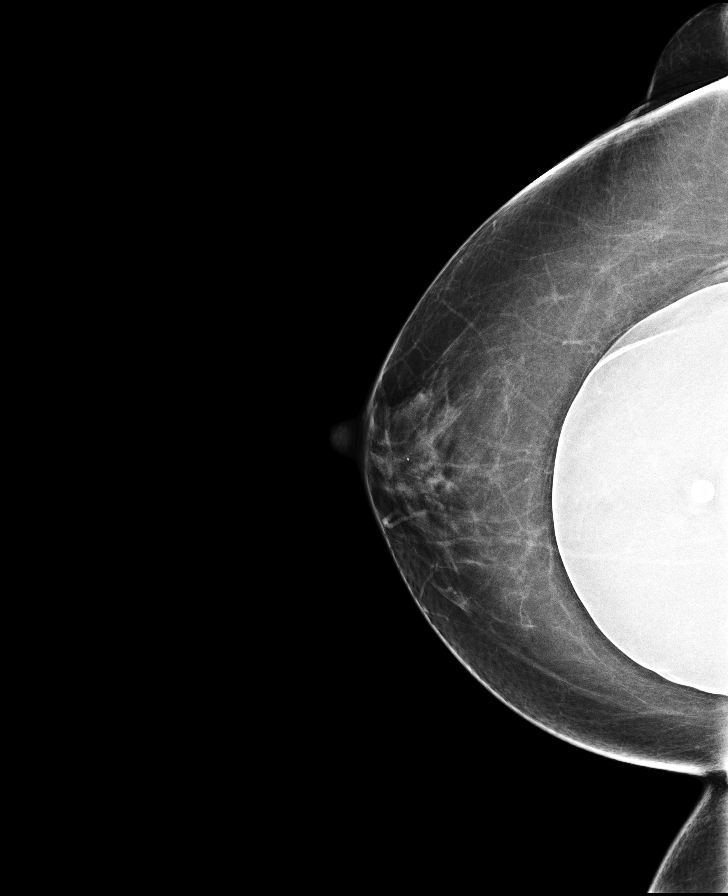

[L CC]
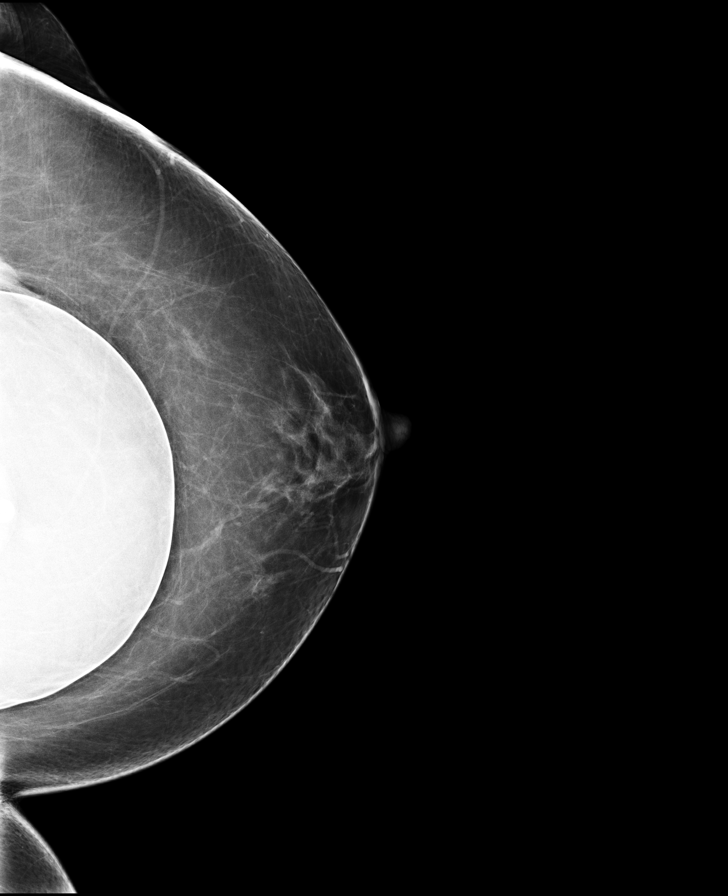

[L MLO]
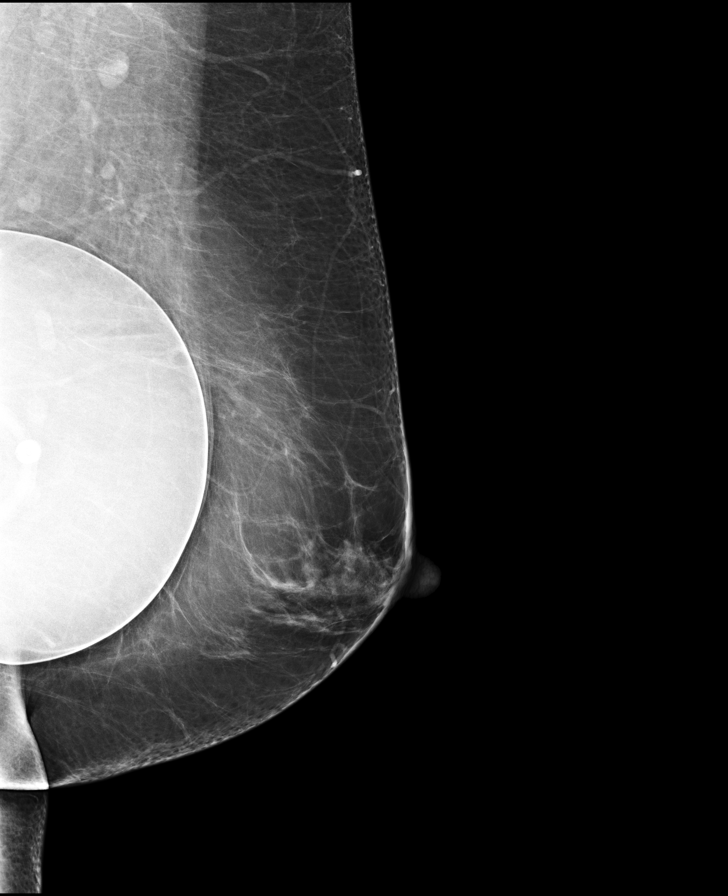

[L CC synth-2D]
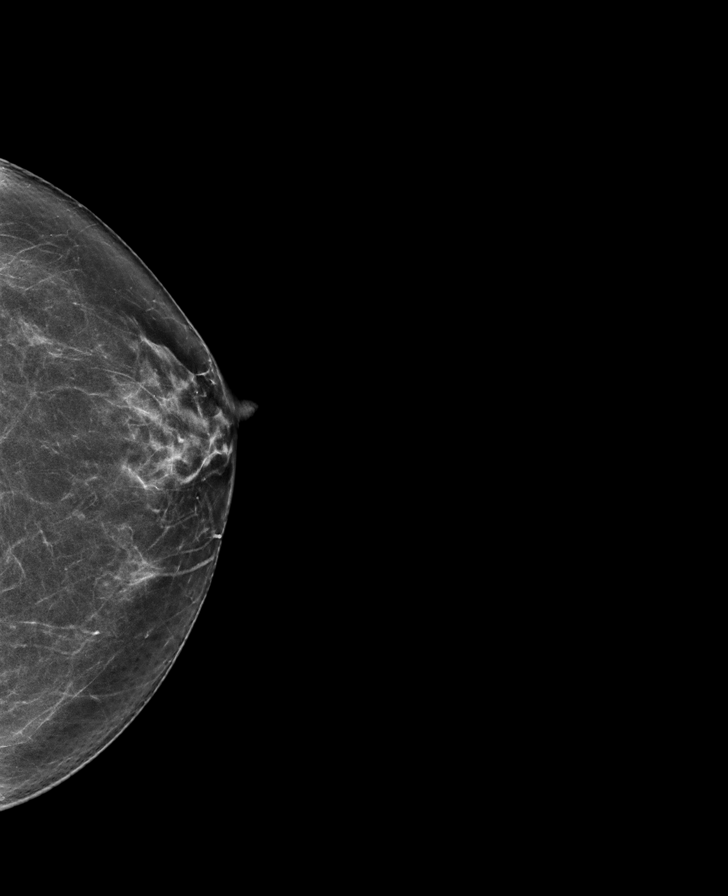

[R MLO synth-2D]
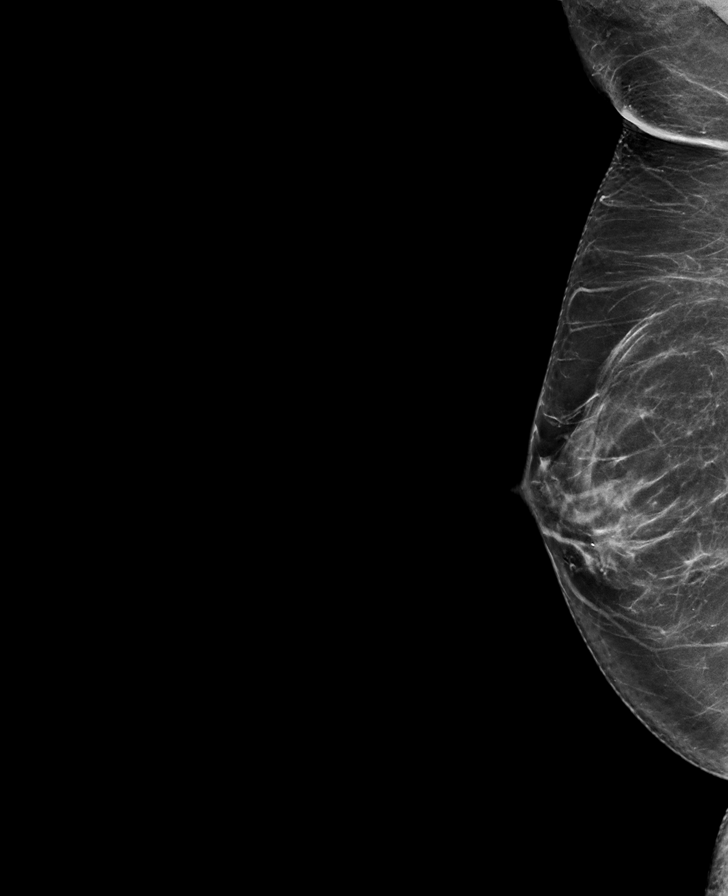

[R CC synth-2D]
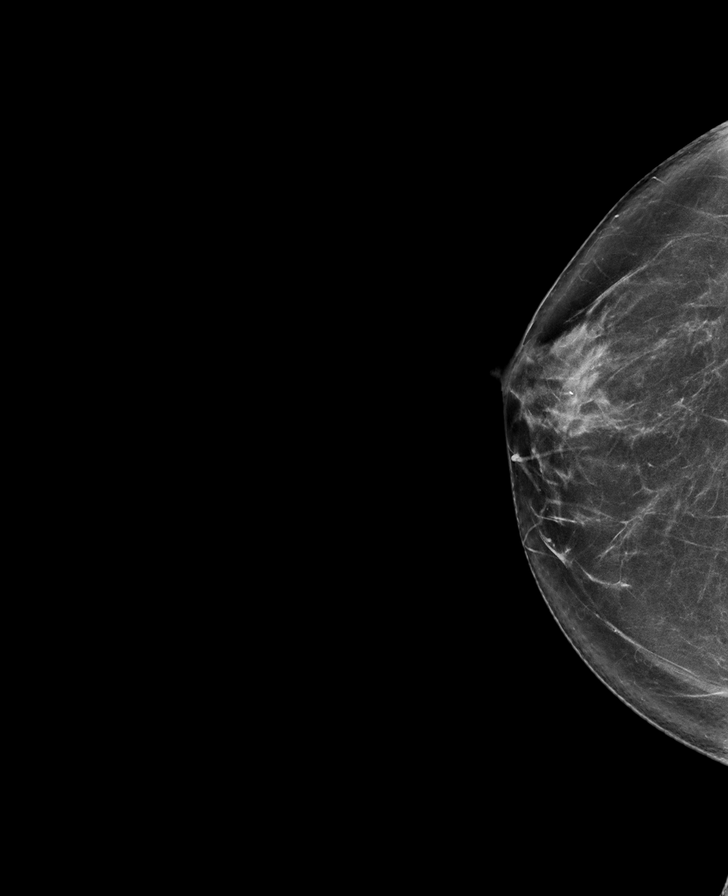

[L MLO synth-2D]
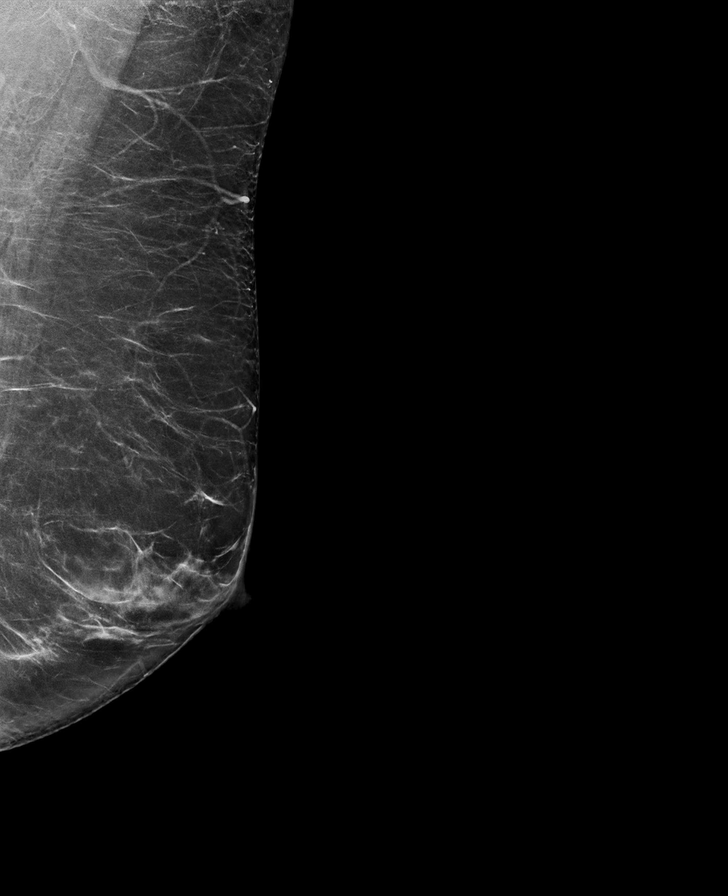

[8 of 28 positions shown; findings below may reference images not displayed]

ACR Breast Density Category b: There are scattered areas of
fibroglandular density.
FINDINGS: There are no findings suspicious for malignancy. Images were
processed with CAD.
IMPRESSION: No mammographic evidence of malignancy. A result letter of this
screening mammogram will be mailed directly to the patient.

RECOMMENDATION:
Screening mammogram in one year. (Code:60-T-8Z4)

BI-RADS CATEGORY  1:  Negative.

## 2020-07-23 ENCOUNTER — Other Ambulatory Visit: Payer: Self-pay

## 2020-07-23 ENCOUNTER — Ambulatory Visit: Payer: 59 | Admitting: Family Medicine

## 2020-07-23 ENCOUNTER — Ambulatory Visit (INDEPENDENT_AMBULATORY_CARE_PROVIDER_SITE_OTHER): Payer: 59 | Admitting: Osteopathic Medicine

## 2020-07-23 VITALS — BP 119/72 | HR 83 | Temp 98.2°F | Resp 20 | Ht 67.0 in | Wt 233.0 lb

## 2020-07-23 DIAGNOSIS — Z6836 Body mass index (BMI) 36.0-36.9, adult: Secondary | ICD-10-CM

## 2020-07-23 DIAGNOSIS — Z7689 Persons encountering health services in other specified circumstances: Secondary | ICD-10-CM

## 2020-07-23 MED ORDER — PHENTERMINE HCL 15 MG PO CAPS: 15.0000 mg | ORAL_CAPSULE | ORAL | 0 refills | Status: DC

## 2020-07-23 MED ORDER — TOPIRAMATE 25 MG PO TABS: 25.0000 mg | ORAL_TABLET | Freq: Two times a day (BID) | ORAL | 1 refills | Status: DC

## 2020-07-23 NOTE — Progress Notes (Signed)
Established Patient Office Visit  Subjective:  Patient ID: Andrea Marquez, female    DOB: [birth year]  Age: 50 y.o. MRN: [id number]  CC:  Chief Complaint  Patient presents with   Weight Check    HPI Andrea Marquez presents for a weight check. Pt has lost weight since last visit. Weight today is 233lbs.  Past Medical History:  Diagnosis Date   ADHD    Anxiety    Atrial tachycardia ([place])    Back pain    Depression    severe   Female infertility    GERD (gastroesophageal reflux disease)    Hyperlipidemia    Hypertension    Obesity    Palpitations    PONV (postoperative nausea and vomiting)    Prediabetes    Severe anxiety    Vertigo     Past Surgical History:  Procedure Laterality Date   AUGMENTATION MAMMAPLASTY     BREAST ENHANCEMENT SURGERY     LAPAROSCOPY     NSVD     x2   OOPHORECTOMY     Single    TOTAL VAGINAL HYSTERECTOMY     TUBAL LIGATION      Family History  Problem Relation Age of Onset   COPD Father    Atrial fibrillation Father    Diabetes Father    Hypertension Father    Depression Father    Anxiety disorder Father    Obesity Father    Esophageal cancer Maternal Grandfather    Hypertension Mother    Skin cancer Mother    Depression Mother    Anxiety disorder Mother    Bipolar disorder Mother    Drug abuse Mother    Obesity Mother    Colon cancer Neg Hx    Colon polyps Neg Hx    Rectal cancer Neg Hx    Stomach cancer Neg Hx     Social History   Socioeconomic History   Marital status: Married    Spouse name: Randall Hiss   Number of children: 2   Years of education: Not on file   Highest education level: Not on file  Occupational History   Occupation: Work from Home  Tobacco Use   Smoking status: Never   Smokeless tobacco: Never  Substance and Sexual Activity   Alcohol use: Yes    Alcohol/week: 0.0 standard drinks    Comment: socially on weekends    Drug use: No   Sexual activity: Never  Other Topics Concern   Not on file   Social History Narrative   Daily caffeine. Works on regularly.    Social Determinants of Health   Financial Resource Strain: Not on file  Food Insecurity: Not on file  Transportation Needs: Not on file  Physical Activity: Not on file  Stress: Not on file  Social Connections: Not on file  Intimate Partner Violence: Not on file    Outpatient Medications Prior to Visit  Medication Sig Dispense Refill   [START ON 08/07/2020] amphetamine-dextroamphetamine (ADDERALL XR) 30 MG 24 hr capsule Take 1 capsule (30 mg total) by mouth every morning. 30 capsule 0   amphetamine-dextroamphetamine (ADDERALL XR) 30 MG 24 hr capsule Take 1 capsule (30 mg total) by mouth every morning. 30 capsule 0   amphetamine-dextroamphetamine (ADDERALL XR) 30 MG 24 hr capsule Take 1 capsule (30 mg total) by mouth every morning. 30 capsule 0   amphetamine-dextroamphetamine (ADDERALL) 10 MG tablet Take 1 tablet (10 mg total) by mouth daily at 12 noon. 30 tablet  0   clonazePAM (KLONOPIN) 0.5 MG tablet Take 1 tablet (0.5 mg total) by mouth 2 (two) times daily as needed for anxiety. 20 tablet 0   hydrOXYzine (ATARAX/VISTARIL) 25 MG tablet TAKE 1 TABLET (25 MG TOTAL) BY MOUTH 3 (THREE) TIMES DAILY AS NEEDED FOR ANXIETY. 270 tablet 1   lisinopril-hydrochlorothiazide (ZESTORETIC) 20-25 MG tablet TAKE 1 TABLET BY MOUTH EVERY DAY 90 tablet 1   metoprolol succinate (TOPROL-XL) 25 MG 24 hr tablet TAKE 1 TABLET BY MOUTH DAILY. TAKE WITH OR IMMEDIATELY FOLLOWING A MEAL. 90 tablet 1   phentermine 15 MG capsule Take 1 capsule (15 mg total) by mouth every morning. 30 capsule 0   topiramate (TOPAMAX) 25 MG tablet Take 1 tablet (25 mg total) by mouth 2 (two) times daily. 60 tablet 1   valACYclovir (VALTREX) 1000 MG tablet Take 2 tablets (2,000 mg total) by mouth 2 (two) times daily. X 1 day. ( 4 tabs per episode) 20 tablet 5   venlafaxine XR (EFFEXOR-XR) 150 MG 24 hr capsule Take 1 capsule (150 mg total) by mouth daily with breakfast. 90  capsule 1   venlafaxine XR (EFFEXOR-XR) 37.5 MG 24 hr capsule TAKE 1 CAPSULE BY MOUTH DAILY WITH BREAKFAST. 90 capsule 0   No facility-administered medications prior to visit.    No Known Allergies  ROS Review of Systems    Objective:    Physical Exam  BP 119/72 (BP Location: Left Arm, Patient Position: Sitting, Cuff Size: Large)   Pulse 83   Temp 98.2 F (36.8 C) (Oral)   Resp 20   Ht 5\' 7"  (1.702 m)   Wt 233 lb (105.7 kg)   SpO2 98%   BMI 36.49 kg/m  Wt Readings from Last 3 Encounters:  07/23/20 233 lb (105.7 kg)  06/19/20 246 lb (111.6 kg)  04/29/20 252 lb (114.3 kg)     Health Maintenance Due  Topic Date Due   Hepatitis C Screening  Never done   Zoster Vaccines- Shingrix (1 of 2) Never done   COVID-19 Vaccine (3 - Booster for Pfizer series) 04/26/2020    There are no preventive care reminders to display for this patient.  Lab Results  Component Value Date   TSH 2.540 06/05/2019   Lab Results  Component Value Date   WBC 5.9 12/31/2019   HGB 14.4 12/31/2019   HCT 41.5 12/31/2019   MCV 92.8 12/31/2019   PLT 320 12/31/2019   Lab Results  Component Value Date   NA 140 12/31/2019   K 4.3 12/31/2019   CO2 29 12/31/2019   GLUCOSE 96 12/31/2019   BUN 25 12/31/2019   CREATININE 0.62 12/31/2019   BILITOT 0.5 12/31/2019   ALKPHOS 74 06/05/2019   AST 13 12/31/2019   ALT 17 12/31/2019   PROT 7.1 12/31/2019   ALBUMIN 4.8 06/05/2019   CALCIUM 9.6 12/31/2019   Lab Results  Component Value Date   CHOL 234 (H) 06/05/2019   Lab Results  Component Value Date   HDL 54 06/05/2019   Lab Results  Component Value Date   LDLCALC 157 (H) 06/05/2019   Lab Results  Component Value Date   TRIG 127 06/05/2019   Lab Results  Component Value Date   CHOLHDL 3.6 06/28/2018   Lab Results  Component Value Date   HGBA1C 5.5 06/05/2019      Assessment & Plan:  Pt has lost weight since last visit. Weight today is 233lbs. Pt will return in 4 weeks for a  weight  check. Refills pended. Problem List Items Addressed This Visit       Other   Encounter for weight management - Primary    No orders of the defined types were placed in this encounter.   Follow-up: Return in about 4 weeks (around 08/20/2020) for Weight Check.    Ninfa Meeker, CMA

## 2020-07-23 NOTE — Progress Notes (Signed)
Wt Readings from Last 3 Encounters:  07/23/20 233 lb (105.7 kg)  06/19/20 246 lb (111.6 kg)  04/29/20 252 lb (114.3 kg)

## 2020-08-13 ENCOUNTER — Telehealth: Payer: Self-pay | Admitting: Neurology

## 2020-08-13 NOTE — Telephone Encounter (Signed)
Prior Authorization for Qsymia submitted via covermymeds. Awaiting response.

## 2020-08-21 ENCOUNTER — Ambulatory Visit (INDEPENDENT_AMBULATORY_CARE_PROVIDER_SITE_OTHER): Payer: 59 | Admitting: Family Medicine

## 2020-08-21 ENCOUNTER — Encounter: Payer: Self-pay | Admitting: Family Medicine

## 2020-08-21 ENCOUNTER — Other Ambulatory Visit: Payer: Self-pay

## 2020-08-21 VITALS — BP 96/63 | HR 82 | Temp 98.6°F | Ht 67.0 in | Wt 232.0 lb

## 2020-08-21 DIAGNOSIS — Z7689 Persons encountering health services in other specified circumstances: Secondary | ICD-10-CM | POA: Diagnosis not present

## 2020-08-21 DIAGNOSIS — F418 Other specified anxiety disorders: Secondary | ICD-10-CM

## 2020-08-21 DIAGNOSIS — I10 Essential (primary) hypertension: Secondary | ICD-10-CM | POA: Diagnosis not present

## 2020-08-21 NOTE — Progress Notes (Signed)
Established Patient Office Visit  Subjective:  Patient ID: Andrea Marquez, female    DOB: 1970-02-23  Age: 50 y.o. MRN: [id number]  CC:  Chief Complaint  Patient presents with   Weight Loss    HPI Andrea Marquez presents for weight management.  She is actually doing really well on her current regimen she is on phentermine 15 mg and Topamax 25 mg twice a day she says she actually feels like the Topamax is helping a little bit with her anxiety she actually feels like it is calming her down a little bit.  She and her husband recently installed a pool and so she has been trying to get in and do some more exercises and be more active she is been trying to follow the DASH diet as well she is trying to increase her protein and cut back on salt intake.  Pretension-doing really well we actually had started splitting her metoprolol in half to see if she tolerated it well.  She has been doing great on that regimen.  Weights:  252, 246, 233, 81  Past Medical History:  Diagnosis Date   ADHD    Anxiety    Atrial tachycardia (HCC)    Back pain    Depression    severe   Female infertility    GERD (gastroesophageal reflux disease)    Hyperlipidemia    Hypertension    Obesity    Palpitations    PONV (postoperative nausea and vomiting)    Prediabetes    Severe anxiety    Vertigo     Past Surgical History:  Procedure Laterality Date   AUGMENTATION MAMMAPLASTY     BREAST ENHANCEMENT SURGERY     LAPAROSCOPY     NSVD     x2   OOPHORECTOMY     Single    TOTAL VAGINAL HYSTERECTOMY     TUBAL LIGATION      Family History  Problem Relation Age of Onset   COPD Father    Atrial fibrillation Father    Diabetes Father    Hypertension Father    Depression Father    Anxiety disorder Father    Obesity Father    Esophageal cancer Maternal Grandfather    Hypertension Mother    Skin cancer Mother    Depression Mother    Anxiety disorder Mother    Bipolar disorder Mother    Drug abuse  Mother    Obesity Mother    Colon cancer Neg Hx    Colon polyps Neg Hx    Rectal cancer Neg Hx    Stomach cancer Neg Hx     Social History   Socioeconomic History   Marital status: Married    Spouse name: Randall Hiss   Number of children: 2   Years of education: Not on file   Highest education level: Not on file  Occupational History   Occupation: Work from Home  Tobacco Use   Smoking status: Never   Smokeless tobacco: Never  Substance and Sexual Activity   Alcohol use: Yes    Alcohol/week: 0.0 standard drinks    Comment: socially on weekends    Drug use: No   Sexual activity: Never  Other Topics Concern   Not on file  Social History Narrative   Daily caffeine. Works on regularly.    Social Determinants of Health   Financial Resource Strain: Not on file  Food Insecurity: Not on file  Transportation Needs: Not on file  Physical Activity:  Not on file  Stress: Not on file  Social Connections: Not on file  Intimate Partner Violence: Not on file    Outpatient Medications Prior to Visit  Medication Sig Dispense Refill   amphetamine-dextroamphetamine (ADDERALL XR) 30 MG 24 hr capsule Take 1 capsule (30 mg total) by mouth every morning. 30 capsule 0   amphetamine-dextroamphetamine (ADDERALL XR) 30 MG 24 hr capsule Take 1 capsule (30 mg total) by mouth every morning. 30 capsule 0   amphetamine-dextroamphetamine (ADDERALL XR) 30 MG 24 hr capsule Take 1 capsule (30 mg total) by mouth every morning. 30 capsule 0   amphetamine-dextroamphetamine (ADDERALL) 10 MG tablet Take 1 tablet (10 mg total) by mouth daily at 12 noon. 30 tablet 0   clonazePAM (KLONOPIN) 0.5 MG tablet Take 1 tablet (0.5 mg total) by mouth 2 (two) times daily as needed for anxiety. 20 tablet 0   hydrOXYzine (ATARAX/VISTARIL) 25 MG tablet TAKE 1 TABLET (25 MG TOTAL) BY MOUTH 3 (THREE) TIMES DAILY AS NEEDED FOR ANXIETY. 270 tablet 1   lisinopril-hydrochlorothiazide (ZESTORETIC) 20-25 MG tablet TAKE 1 TABLET BY MOUTH  EVERY DAY 90 tablet 1   phentermine 15 MG capsule Take 1 capsule (15 mg total) by mouth every morning. 30 capsule 0   topiramate (TOPAMAX) 25 MG tablet Take 1 tablet (25 mg total) by mouth 2 (two) times daily. 60 tablet 1   valACYclovir (VALTREX) 1000 MG tablet Take 2 tablets (2,000 mg total) by mouth 2 (two) times daily. X 1 day. ( 4 tabs per episode) 20 tablet 5   venlafaxine XR (EFFEXOR-XR) 150 MG 24 hr capsule Take 1 capsule (150 mg total) by mouth daily with breakfast. 90 capsule 1   venlafaxine XR (EFFEXOR-XR) 37.5 MG 24 hr capsule TAKE 1 CAPSULE BY MOUTH DAILY WITH BREAKFAST. 90 capsule 0   metoprolol succinate (TOPROL-XL) 25 MG 24 hr tablet TAKE 1 TABLET BY MOUTH DAILY. TAKE WITH OR IMMEDIATELY FOLLOWING A MEAL. 90 tablet 1   No facility-administered medications prior to visit.    No Known Allergies  ROS Review of Systems    Objective:    Physical Exam Constitutional:      Appearance: Normal appearance. She is well-developed.  HENT:     Head: Normocephalic and atraumatic.  Cardiovascular:     Rate and Rhythm: Normal rate and regular rhythm.     Heart sounds: Normal heart sounds.  Pulmonary:     Effort: Pulmonary effort is normal.     Breath sounds: Normal breath sounds.  Skin:    General: Skin is warm and dry.  Neurological:     Mental Status: She is alert and oriented to person, place, and time.  Psychiatric:        Behavior: Behavior normal.    BP 96/63   Pulse 82   Temp 98.6 F (37 C)   Ht '5\' 7"'$  (1.702 m)   Wt 232 lb (105.2 kg)   SpO2 98% Comment: on RA  BMI 36.34 kg/m  Wt Readings from Last 3 Encounters:  08/21/20 232 lb (105.2 kg)  07/23/20 233 lb (105.7 kg)  06/19/20 246 lb (111.6 kg)     Health Maintenance Due  Topic Date Due   Hepatitis C Screening  Never done   Zoster Vaccines- Shingrix (1 of 2) Never done   COVID-19 Vaccine (3 - Booster for Pfizer series) 04/26/2020    There are no preventive care reminders to display for this  patient.  Lab Results  Component Value Date  TSH 2.540 06/05/2019   Lab Results  Component Value Date   WBC 5.9 12/31/2019   HGB 14.4 12/31/2019   HCT 41.5 12/31/2019   MCV 92.8 12/31/2019   PLT 320 12/31/2019   Lab Results  Component Value Date   NA 140 12/31/2019   K 4.3 12/31/2019   CO2 29 12/31/2019   GLUCOSE 96 12/31/2019   BUN 25 12/31/2019   CREATININE 0.62 12/31/2019   BILITOT 0.5 12/31/2019   ALKPHOS 74 06/05/2019   AST 13 12/31/2019   ALT 17 12/31/2019   PROT 7.1 12/31/2019   ALBUMIN 4.8 06/05/2019   CALCIUM 9.6 12/31/2019   Lab Results  Component Value Date   CHOL 234 (H) 06/05/2019   Lab Results  Component Value Date   HDL 54 06/05/2019   Lab Results  Component Value Date   LDLCALC 157 (H) 06/05/2019   Lab Results  Component Value Date   TRIG 127 06/05/2019   Lab Results  Component Value Date   CHOLHDL 3.6 06/28/2018   Lab Results  Component Value Date   HGBA1C 5.5 06/05/2019      Assessment & Plan:   Problem List Items Addressed This Visit       Cardiovascular and Mediastinum   Hypertension    Blood pressure has been coming down really nicely and she has not been having palpitations again and discontinue the metoprolol completely and then continue working on tapering the lisinopril HCTZ depending on her blood pressure levels.         Other   Encounter for weight management - Primary     Visit #: 4 Starting Weight: 252 lbs   Current weight: 232 lb Previous weight: 233 lbs Change in weight: Down 1 lbs Goal weight: 200 lbs Dietary goals: Continue to work on [employer] plans on starting DASH meal prep.  She is also working on increasing protein.  She has cut out alcohol and is mostly doing water.   Exercise goals: Shoot for exercising at least 3 days/week.    Has started doing some home pool exercises.. Medication: Topiramate 25 mg twice a day, phentermine 15 mg daily. Follow-up and referrals: 4 weeks nurse visit.           Depression with anxiety    Still on Effexor. Has felt less stressed on the Topiramax.  She has been working with a Transport planner.  Still having some major struggles with her daughter who is dealing with mood d/o.          No orders of the defined types were placed in this encounter.   Follow-up: Return in about 4 weeks (around 09/18/2020) for weight check .   I spent 35  minutes on the day of the encounter to include pre-visit record review, face-to-face time with the patient and post visit ordering of test.   Beatrice Lecher, MD

## 2020-08-21 NOTE — Assessment & Plan Note (Signed)
Visit #: 4 Starting Weight: 252 lbs  Current weight: 232 lb Previous weight: 233 lbs Change in weight: Down 1 lbs Goal weight: 200 lbs Dietary goals: Continue to work on [employer] plans on starting DASH meal prep.  She is also working on increasing protein.  She has cut out alcohol and is mostly doing water.   Exercise goals: Shoot for exercising at least 3 days/week.    Has started doing some home pool exercises.. Medication: Topiramate 25 mg twice a day, phentermine 15 mg daily. Follow-up and referrals: 4 weeks nurse visit.

## 2020-08-21 NOTE — Assessment & Plan Note (Addendum)
Still on Effexor. Has felt less stressed on the Topiramax.  She has been working with a Transport planner.  Still having some major struggles with her daughter who is dealing with mood d/o.

## 2020-08-21 NOTE — Assessment & Plan Note (Signed)
Blood pressure has been coming down really nicely and she has not been having palpitations again and discontinue the metoprolol completely and then continue working on tapering the lisinopril HCTZ depending on her blood pressure levels.

## 2020-08-23 ENCOUNTER — Other Ambulatory Visit: Payer: Self-pay | Admitting: Family Medicine

## 2020-08-23 DIAGNOSIS — Z7689 Persons encountering health services in other specified circumstances: Secondary | ICD-10-CM

## 2020-08-25 ENCOUNTER — Other Ambulatory Visit: Payer: Self-pay | Admitting: Family Medicine

## 2020-08-25 ENCOUNTER — Other Ambulatory Visit: Payer: Self-pay | Admitting: Osteopathic Medicine

## 2020-08-25 DIAGNOSIS — Z7689 Persons encountering health services in other specified circumstances: Secondary | ICD-10-CM

## 2020-09-16 ENCOUNTER — Other Ambulatory Visit: Payer: Self-pay | Admitting: Osteopathic Medicine

## 2020-09-16 DIAGNOSIS — Z6836 Body mass index (BMI) 36.0-36.9, adult: Secondary | ICD-10-CM

## 2020-09-16 DIAGNOSIS — Z7689 Persons encountering health services in other specified circumstances: Secondary | ICD-10-CM

## 2020-09-18 ENCOUNTER — Other Ambulatory Visit: Payer: Self-pay

## 2020-09-18 ENCOUNTER — Encounter: Payer: Self-pay | Admitting: Family Medicine

## 2020-09-18 ENCOUNTER — Ambulatory Visit (INDEPENDENT_AMBULATORY_CARE_PROVIDER_SITE_OTHER): Payer: 59 | Admitting: Family Medicine

## 2020-09-18 VITALS — BP 123/81 | HR 88 | Temp 97.8°F | Ht 67.0 in | Wt 227.0 lb

## 2020-09-18 DIAGNOSIS — F411 Generalized anxiety disorder: Secondary | ICD-10-CM

## 2020-09-18 DIAGNOSIS — Z6836 Body mass index (BMI) 36.0-36.9, adult: Secondary | ICD-10-CM

## 2020-09-18 DIAGNOSIS — I10 Essential (primary) hypertension: Secondary | ICD-10-CM

## 2020-09-18 DIAGNOSIS — F332 Major depressive disorder, recurrent severe without psychotic features: Secondary | ICD-10-CM | POA: Diagnosis not present

## 2020-09-18 DIAGNOSIS — Z7689 Persons encountering health services in other specified circumstances: Secondary | ICD-10-CM | POA: Diagnosis not present

## 2020-09-18 DIAGNOSIS — F902 Attention-deficit hyperactivity disorder, combined type: Secondary | ICD-10-CM

## 2020-09-18 MED ORDER — AMPHETAMINE-DEXTROAMPHETAMINE 10 MG PO TABS: 10.0000 mg | ORAL_TABLET | Freq: Every day | ORAL | 0 refills | Status: DC

## 2020-09-18 MED ORDER — VENLAFAXINE HCL ER 150 MG PO CP24: 150.0000 mg | ORAL_CAPSULE | Freq: Every day | ORAL | 1 refills | Status: DC

## 2020-09-18 MED ORDER — VENLAFAXINE HCL ER 37.5 MG PO CP24: 37.5000 mg | ORAL_CAPSULE | Freq: Every day | ORAL | 1 refills | Status: DC

## 2020-09-18 MED ORDER — AMPHETAMINE-DEXTROAMPHETAMINE 10 MG PO TABS
10.0000 mg | ORAL_TABLET | Freq: Every day | ORAL | 0 refills | Status: DC
Start: 2020-09-18 — End: 2020-12-31

## 2020-09-18 MED ORDER — PHENTERMINE HCL 15 MG PO CAPS: ORAL_CAPSULE | ORAL | 0 refills | Status: DC

## 2020-09-18 MED ORDER — TOPIRAMATE 25 MG PO TABS: 25.0000 mg | ORAL_TABLET | Freq: Two times a day (BID) | ORAL | 2 refills | Status: DC

## 2020-09-18 NOTE — Assessment & Plan Note (Signed)
Ok to cut BP pill in half.  BP looks great today and she hasn't taken med yet this AM. She has been having dizziness.

## 2020-09-18 NOTE — Progress Notes (Signed)
Established Patient Office Visit  Subjective:  Patient ID: Andrea Marquez, female    DOB: 1970-07-21  Age: 50 y.o. MRN: [id number]  CC:  Chief Complaint  Patient presents with   Weight Check    HPI [name] presents for   Follow-up weight management-overall she feels like she is doing well she feels like the 1 thing she would really like to work on is getting more consistent with her exercise regimen.  She feels like overall she is doing pretty good with her diet choices.  She is currently on Topamax 50 mg twice a day as well as phentermine.  She has not had any side effects with the medication.  She is down about 5 more pounds since I last saw her which is great.  She still has a lot of stressors going on at home and that is been really triggering some stress eating lately.  She has also had a hard time getting her lower dose of venlafaxine filled she takes a 150+ a 37.5 mg tab.  And they do not have any more refills at the pharmacy for the lower dose.  Follow-up ADHD-she has had some problems getting her lower afternoon dose filled lately.  Hypertension-she has been taking her blood pressure medication regularly and has noticed that she has been feeling a little bit more dizzy with it.  In fact she got here this morning a little bit late and was unable to take her blood pressure pill before she got here and her blood pressures have actually been a little bit lower.  Past Medical History:  Diagnosis Date   ADHD    Anxiety    Atrial tachycardia ([place])    Back pain    Depression    severe   Female infertility    GERD (gastroesophageal reflux disease)    Hyperlipidemia    Hypertension    Obesity    Palpitations    PONV (postoperative nausea and vomiting)    Prediabetes    Severe anxiety    Vertigo     Past Surgical History:  Procedure Laterality Date   AUGMENTATION MAMMAPLASTY     BREAST ENHANCEMENT SURGERY     LAPAROSCOPY     NSVD     x2   OOPHORECTOMY      Single    TOTAL VAGINAL HYSTERECTOMY     TUBAL LIGATION      Family History  Problem Relation Age of Onset   COPD Father    Atrial fibrillation Father    Diabetes Father    Hypertension Father    Depression Father    Anxiety disorder Father    Obesity Father    Esophageal cancer Maternal Grandfather    Hypertension Mother    Skin cancer Mother    Depression Mother    Anxiety disorder Mother    Bipolar disorder Mother    Drug abuse Mother    Obesity Mother    Colon cancer Neg Hx    Colon polyps Neg Hx    Rectal cancer Neg Hx    Stomach cancer Neg Hx     Social History   Socioeconomic History   Marital status: Married    Spouse name: Randall Hiss   Number of children: 2   Years of education: Not on file   Highest education level: Not on file  Occupational History   Occupation: Work from Home  Tobacco Use   Smoking status: Never   Smokeless tobacco: Never  Substance and Sexual Activity   Alcohol use: Yes    Alcohol/week: 0.0 standard drinks    Comment: socially on weekends    Drug use: No   Sexual activity: Never  Other Topics Concern   Not on file  Social History Narrative   Daily caffeine. Works on regularly.    Social Determinants of Health   Financial Resource Strain: Not on file  Food Insecurity: Not on file  Transportation Needs: Not on file  Physical Activity: Not on file  Stress: Not on file  Social Connections: Not on file  Intimate Partner Violence: Not on file    Outpatient Medications Prior to Visit  Medication Sig Dispense Refill   amphetamine-dextroamphetamine (ADDERALL XR) 30 MG 24 hr capsule Take 1 capsule (30 mg total) by mouth every morning. 30 capsule 0   amphetamine-dextroamphetamine (ADDERALL XR) 30 MG 24 hr capsule Take 1 capsule (30 mg total) by mouth every morning. 30 capsule 0   amphetamine-dextroamphetamine (ADDERALL XR) 30 MG 24 hr capsule Take 1 capsule (30 mg total) by mouth every morning. 30 capsule 0   clonazePAM (KLONOPIN) 0.5  MG tablet Take 1 tablet (0.5 mg total) by mouth 2 (two) times daily as needed for anxiety. 20 tablet 0   hydrOXYzine (ATARAX/VISTARIL) 25 MG tablet TAKE 1 TABLET (25 MG TOTAL) BY MOUTH 3 (THREE) TIMES DAILY AS NEEDED FOR ANXIETY. 270 tablet 1   lisinopril-hydrochlorothiazide (ZESTORETIC) 20-25 MG tablet TAKE 1 TABLET BY MOUTH EVERY DAY 90 tablet 1   valACYclovir (VALTREX) 1000 MG tablet Take 2 tablets (2,000 mg total) by mouth 2 (two) times daily. X 1 day. ( 4 tabs per episode) 20 tablet 5   amphetamine-dextroamphetamine (ADDERALL) 10 MG tablet Take 1 tablet (10 mg total) by mouth daily at 12 noon. 30 tablet 0   phentermine 15 MG capsule TAKE 1 CAPSULE BY MOUTH EVERY DAY IN THE MORNING 30 capsule 0   topiramate (TOPAMAX) 25 MG tablet TAKE 1 TABLET BY MOUTH TWICE A DAY 60 tablet 0   venlafaxine XR (EFFEXOR-XR) 150 MG 24 hr capsule Take 1 capsule (150 mg total) by mouth daily with breakfast. 90 capsule 1   venlafaxine XR (EFFEXOR-XR) 37.5 MG 24 hr capsule TAKE 1 CAPSULE BY MOUTH DAILY WITH BREAKFAST. 90 capsule 0   No facility-administered medications prior to visit.    No Known Allergies  ROS Review of Systems    Objective:    Physical Exam Constitutional:      Appearance: Normal appearance. She is well-developed.  HENT:     Head: Normocephalic and atraumatic.  Cardiovascular:     Rate and Rhythm: Normal rate and regular rhythm.     Heart sounds: Normal heart sounds.  Pulmonary:     Effort: Pulmonary effort is normal.     Breath sounds: Normal breath sounds.  Skin:    General: Skin is warm and dry.  Neurological:     Mental Status: She is alert and oriented to person, place, and time.  Psychiatric:        Behavior: Behavior normal.    BP 123/81   Pulse 88   Temp 97.8 F (36.6 C)   Ht '5\' 7"'$  (1.702 m)   Wt 227 lb (103 kg)   SpO2 98%   BMI 35.55 kg/m  Wt Readings from Last 3 Encounters:  09/18/20 227 lb (103 kg)  08/21/20 232 lb (105.2 kg)  07/23/20 233 lb (105.7 kg)      Health Maintenance Due  Topic Date  Due   Hepatitis C Screening  Never done   Zoster Vaccines- Shingrix (1 of 2) Never done   COVID-19 Vaccine (3 - Booster for Pfizer series) 04/26/2020   INFLUENZA VACCINE  08/25/2020    There are no preventive care reminders to display for this patient.  Lab Results  Component Value Date   TSH 2.540 06/05/2019   Lab Results  Component Value Date   WBC 5.9 12/31/2019   HGB 14.4 12/31/2019   HCT 41.5 12/31/2019   MCV 92.8 12/31/2019   PLT 320 12/31/2019   Lab Results  Component Value Date   NA 140 12/31/2019   K 4.3 12/31/2019   CO2 29 12/31/2019   GLUCOSE 96 12/31/2019   BUN 25 12/31/2019   CREATININE 0.62 12/31/2019   BILITOT 0.5 12/31/2019   ALKPHOS 74 06/05/2019   AST 13 12/31/2019   ALT 17 12/31/2019   PROT 7.1 12/31/2019   ALBUMIN 4.8 06/05/2019   CALCIUM 9.6 12/31/2019   Lab Results  Component Value Date   CHOL 234 (H) 06/05/2019   Lab Results  Component Value Date   HDL 54 06/05/2019   Lab Results  Component Value Date   LDLCALC 157 (H) 06/05/2019   Lab Results  Component Value Date   TRIG 127 06/05/2019   Lab Results  Component Value Date   CHOLHDL 3.6 06/28/2018   Lab Results  Component Value Date   HGBA1C 5.5 06/05/2019      Assessment & Plan:   Problem List Items Addressed This Visit       Cardiovascular and Mediastinum   Hypertension    Ok to cut BP pill in half.  BP looks great today and she hasn't taken med yet this AM. She has been having dizziness.         Other   Severe episode of recurrent major depressive disorder, without psychotic features (HCC)   Relevant Medications   venlafaxine XR (EFFEXOR-XR) 37.5 MG 24 hr capsule   venlafaxine XR (EFFEXOR-XR) 150 MG 24 hr capsule   Generalized anxiety disorder    Overall doing well on the Effexor.  She has been having hard time getting the lower dose when filled at the pharmacy.  We will go ahead and send over new prescriptions for the  150 and a 37.5 today.      Relevant Medications   venlafaxine XR (EFFEXOR-XR) 37.5 MG 24 hr capsule   venlafaxine XR (EFFEXOR-XR) 150 MG 24 hr capsule   Encounter for weight management - Primary     Visit #: 5 Starting Weight: 252 lbs   Current weight: 227 lb Previous weight: 232 lbs Change in weight: Down 5 lbs Goal weight: 200 lbs Dietary goals: Continue to work on [employer] plans on starting DASH meal prep.  She is also working on increasing protein.  She has cut out alcohol and is mostly doing water.   Exercise goals: Shoot for exercising at least 3 days/week.    Has started doing some home pool exercises.. Medication: Topiramate 25 mg twice a day, phentermine 15 mg daily. Follow-up and referrals: 4 weeks nurse visit.          Relevant Medications   phentermine 15 MG capsule   topiramate (TOPAMAX) 25 MG tablet   Attention deficit hyperactivity disorder (ADHD), combined type   Relevant Medications   amphetamine-dextroamphetamine (ADDERALL) 10 MG tablet (Start on 11/16/2020)   amphetamine-dextroamphetamine (ADDERALL) 10 MG tablet (Start on 10/18/2020)   amphetamine-dextroamphetamine (ADDERALL) 10 MG tablet  Other Visit Diagnoses     Class 2 severe obesity with serious comorbidity and body mass index (BMI) of 36.0 to 36.9 in adult, unspecified obesity type ([place])       Relevant Medications   amphetamine-dextroamphetamine (ADDERALL) 10 MG tablet (Start on 11/16/2020)   amphetamine-dextroamphetamine (ADDERALL) 10 MG tablet (Start on 10/18/2020)   amphetamine-dextroamphetamine (ADDERALL) 10 MG tablet   phentermine 15 MG capsule   topiramate (TOPAMAX) 25 MG tablet       Meds ordered this encounter  Medications   amphetamine-dextroamphetamine (ADDERALL) 10 MG tablet    Sig: Take 1 tablet (10 mg total) by mouth daily after lunch.    Dispense:  30 tablet    Refill:  0   amphetamine-dextroamphetamine (ADDERALL) 10 MG tablet    Sig: Take 1 tablet (10 mg total) by mouth  daily after lunch.    Dispense:  30 tablet    Refill:  0   amphetamine-dextroamphetamine (ADDERALL) 10 MG tablet    Sig: Take 1 tablet (10 mg total) by mouth daily after lunch.    Dispense:  30 tablet    Refill:  0   venlafaxine XR (EFFEXOR-XR) 37.5 MG 24 hr capsule    Sig: Take 1 capsule (37.5 mg total) by mouth daily with breakfast.    Dispense:  90 capsule    Refill:  1   venlafaxine XR (EFFEXOR-XR) 150 MG 24 hr capsule    Sig: Take 1 capsule (150 mg total) by mouth daily with breakfast.    Dispense:  90 capsule    Refill:  1   phentermine 15 MG capsule    Sig: TAKE 1 CAPSULE BY MOUTH EVERY DAY IN THE MORNING    Dispense:  30 capsule    Refill:  0    Not to exceed 5 additional fills before 01/19/2021 DX Code Needed  .   topiramate (TOPAMAX) 25 MG tablet    Sig: Take 1 tablet (25 mg total) by mouth 2 (two) times daily.    Dispense:  60 tablet    Refill:  2    Follow-up: No follow-ups on file.    Beatrice Lecher, MD

## 2020-09-18 NOTE — Assessment & Plan Note (Signed)
Visit #:5 Starting Weight: 252 lbs  Current weight:227 lb Previous weight:232 lbs Change in weight:Down 5 lbs Goal weight: 200 lbs Dietary goals:Continue to work on [employer] plans on startingDASHmeal prep.  She is also working on increasing protein.  She has cut out alcohol and is mostly doing water.   Exercise goals:Shoot for exercising at least 3 days/week.   Has started doing some home pool exercises.. Medication:Topiramate 25 mg twice a day, phentermine 15 mg daily. Follow-up and referrals:4 weeks nurse visit.

## 2020-09-18 NOTE — Assessment & Plan Note (Signed)
Overall doing well on the Effexor.  She has been having hard time getting the lower dose when filled at the pharmacy.  We will go ahead and send over new prescriptions for the 150 and a 37.5 today.

## 2020-09-24 ENCOUNTER — Other Ambulatory Visit: Payer: Self-pay | Admitting: Family Medicine

## 2020-10-17 ENCOUNTER — Other Ambulatory Visit: Payer: Self-pay | Admitting: Family Medicine

## 2020-10-20 ENCOUNTER — Other Ambulatory Visit: Payer: Self-pay | Admitting: Family Medicine

## 2020-10-20 DIAGNOSIS — Z7689 Persons encountering health services in other specified circumstances: Secondary | ICD-10-CM

## 2020-12-01 ENCOUNTER — Other Ambulatory Visit: Payer: Self-pay | Admitting: Family Medicine

## 2020-12-01 DIAGNOSIS — Z7689 Persons encountering health services in other specified circumstances: Secondary | ICD-10-CM

## 2020-12-01 DIAGNOSIS — Z6836 Body mass index (BMI) 36.0-36.9, adult: Secondary | ICD-10-CM

## 2020-12-01 DIAGNOSIS — F902 Attention-deficit hyperactivity disorder, combined type: Secondary | ICD-10-CM

## 2020-12-02 ENCOUNTER — Telehealth: Payer: Self-pay | Admitting: Family Medicine

## 2020-12-02 MED ORDER — AMPHETAMINE-DEXTROAMPHET ER 30 MG PO CP24: 30.0000 mg | ORAL_CAPSULE | ORAL | 0 refills | Status: DC

## 2020-12-02 MED ORDER — PHENTERMINE HCL 15 MG PO CAPS: ORAL_CAPSULE | ORAL | 0 refills | Status: DC

## 2020-12-02 MED ORDER — AMPHETAMINE-DEXTROAMPHETAMINE 10 MG PO TABS: 10.0000 mg | ORAL_TABLET | Freq: Every day | ORAL | 0 refills | Status: DC

## 2020-12-02 MED ORDER — TOPIRAMATE 25 MG PO TABS
25.0000 mg | ORAL_TABLET | Freq: Two times a day (BID) | ORAL | 0 refills | Status: DC
Start: 2020-12-02 — End: 2021-06-19

## 2020-12-02 MED ORDER — CLONAZEPAM 0.5 MG PO TABS: 0.5000 mg | ORAL_TABLET | Freq: Two times a day (BID) | ORAL | 0 refills | Status: DC | PRN

## 2020-12-02 NOTE — Telephone Encounter (Signed)
Call pt: I did go ahead and refill meds but needs nurse visit for weight check

## 2020-12-02 NOTE — Telephone Encounter (Signed)
Pt informed of RX and scheduled for weight check on 12/03/2020.  Charyl Bigger, CMA

## 2020-12-03 ENCOUNTER — Other Ambulatory Visit: Payer: Self-pay

## 2020-12-03 ENCOUNTER — Ambulatory Visit (INDEPENDENT_AMBULATORY_CARE_PROVIDER_SITE_OTHER): Payer: 59 | Admitting: Family Medicine

## 2020-12-03 VITALS — BP 123/75 | HR 78 | Temp 98.0°F | Wt 227.0 lb

## 2020-12-03 DIAGNOSIS — Z23 Encounter for immunization: Secondary | ICD-10-CM | POA: Diagnosis not present

## 2020-12-03 DIAGNOSIS — Z6836 Body mass index (BMI) 36.0-36.9, adult: Secondary | ICD-10-CM | POA: Diagnosis not present

## 2020-12-03 DIAGNOSIS — Z7689 Persons encountering health services in other specified circumstances: Secondary | ICD-10-CM | POA: Diagnosis not present

## 2020-12-03 MED ORDER — PHENTERMINE HCL 15 MG PO CAPS: ORAL_CAPSULE | ORAL | 0 refills | Status: DC

## 2020-12-03 NOTE — Progress Notes (Signed)
Patient is here for a weight and blood pressure check. Denies chest pains, palpitations, SOB, lightheadedness, dizziness, mood or medication change.   Patient has not lost weight. Per provider, #30 of phentermine will be sent to the pharmacy. Patient informed that she must lose 1-3 pounds before next visit. Patient stated she has joined [employer] and is eating more vegetable based meals. Rx pended.   During the nurse visit, patient requested Flu and Shingles vaccines. Patient tolerated the injections well without complications on the Left Deltoid (Flu) and on the Right Deltoid (Shingles).   Patient informed to schedule next visit in 30 days.

## 2020-12-03 NOTE — Progress Notes (Signed)
Meds ordered this encounter  Medications   phentermine 15 MG capsule    Sig: TAKE 1 CAPSULE BY MOUTH EVERY DAY IN THE MORNING    Dispense:  30 capsule    Refill:  0    Not to exceed 5 additional fills before 01/19/2021 DX Code Needed  .    Agree with note above.

## 2020-12-31 ENCOUNTER — Other Ambulatory Visit: Payer: Self-pay

## 2020-12-31 ENCOUNTER — Encounter: Payer: Self-pay | Admitting: Family Medicine

## 2020-12-31 ENCOUNTER — Ambulatory Visit: Payer: 59 | Admitting: Family Medicine

## 2020-12-31 VITALS — BP 109/69 | HR 75 | Ht 67.0 in | Wt 229.0 lb

## 2020-12-31 DIAGNOSIS — B001 Herpesviral vesicular dermatitis: Secondary | ICD-10-CM

## 2020-12-31 DIAGNOSIS — E7849 Other hyperlipidemia: Secondary | ICD-10-CM

## 2020-12-31 DIAGNOSIS — F332 Major depressive disorder, recurrent severe without psychotic features: Secondary | ICD-10-CM

## 2020-12-31 DIAGNOSIS — F902 Attention-deficit hyperactivity disorder, combined type: Secondary | ICD-10-CM

## 2020-12-31 DIAGNOSIS — I10 Essential (primary) hypertension: Secondary | ICD-10-CM | POA: Diagnosis not present

## 2020-12-31 DIAGNOSIS — Z6836 Body mass index (BMI) 36.0-36.9, adult: Secondary | ICD-10-CM

## 2020-12-31 DIAGNOSIS — Z7689 Persons encountering health services in other specified circumstances: Secondary | ICD-10-CM

## 2020-12-31 DIAGNOSIS — F411 Generalized anxiety disorder: Secondary | ICD-10-CM

## 2020-12-31 MED ORDER — AMPHETAMINE-DEXTROAMPHETAMINE 10 MG PO TABS: 10.0000 mg | ORAL_TABLET | Freq: Every day | ORAL | 0 refills | Status: DC

## 2020-12-31 MED ORDER — AMPHETAMINE-DEXTROAMPHET ER 30 MG PO CP24: 30.0000 mg | ORAL_CAPSULE | ORAL | 0 refills | Status: DC

## 2020-12-31 MED ORDER — SAXENDA 18 MG/3ML ~~LOC~~ SOPN: PEN_INJECTOR | SUBCUTANEOUS | 1 refills | Status: AC

## 2020-12-31 MED ORDER — VALACYCLOVIR HCL 1 G PO TABS
2000.0000 mg | ORAL_TABLET | Freq: Two times a day (BID) | ORAL | 5 refills | Status: DC
Start: 2020-12-31 — End: 2021-06-19

## 2020-12-31 MED ORDER — PHENTERMINE HCL 15 MG PO CAPS: ORAL_CAPSULE | ORAL | 0 refills | Status: DC

## 2020-12-31 MED ORDER — VENLAFAXINE HCL ER 150 MG PO CP24: 150.0000 mg | ORAL_CAPSULE | Freq: Every day | ORAL | 1 refills | Status: DC

## 2020-12-31 NOTE — Progress Notes (Signed)
Established Patient Office Visit  Subjective:  Patient ID: Andrea Marquez, female    DOB: Jul 10, 1970  Age: 50 y.o. MRN: [id number]  CC:  Chief Complaint  Patient presents with   Weight Check    HPI Andrea Marquez presents for weight management follow-up.  She feels like the phentermine is just no longer helping she says she has been working out exercising running she even ran a race over Thanksgiving.  She is trying to continue to watch what she is eating.  But still continuing to gain weight she really wanted to try to lose weight by January when her son is getting married.  ADD - Reports symptoms are well controlled on current regime. Denies any problems with insomnia, chest pain, palpitations, or SOB.     Past Medical History:  Diagnosis Date   ADHD    Anxiety    Atrial tachycardia ([place])    Back pain    Depression    severe   Female infertility    GERD (gastroesophageal reflux disease)    Hyperlipidemia    Hypertension    Obesity    Palpitations    PONV (postoperative nausea and vomiting)    Prediabetes    Severe anxiety    Vertigo     Past Surgical History:  Procedure Laterality Date   AUGMENTATION MAMMAPLASTY     BREAST ENHANCEMENT SURGERY     LAPAROSCOPY     NSVD     x2   OOPHORECTOMY     Single    TOTAL VAGINAL HYSTERECTOMY     TUBAL LIGATION      Family History  Problem Relation Age of Onset   COPD Father    Atrial fibrillation Father    Diabetes Father    Hypertension Father    Depression Father    Anxiety disorder Father    Obesity Father    Esophageal cancer Maternal Grandfather    Hypertension Mother    Skin cancer Mother    Depression Mother    Anxiety disorder Mother    Bipolar disorder Mother    Drug abuse Mother    Obesity Mother    Colon cancer Neg Hx    Colon polyps Neg Hx    Rectal cancer Neg Hx    Stomach cancer Neg Hx     Social History   Socioeconomic History   Marital status: Married    Spouse name: Randall Hiss    Number of children: 2   Years of education: Not on file   Highest education level: Not on file  Occupational History   Occupation: Work from Home  Tobacco Use   Smoking status: Never   Smokeless tobacco: Never  Substance and Sexual Activity   Alcohol use: Yes    Alcohol/week: 0.0 standard drinks    Comment: socially on weekends    Drug use: No   Sexual activity: Never  Other Topics Concern   Not on file  Social History Narrative   Daily caffeine. Works on regularly.    Social Determinants of Health   Financial Resource Strain: Not on file  Food Insecurity: Not on file  Transportation Needs: Not on file  Physical Activity: Not on file  Stress: Not on file  Social Connections: Not on file  Intimate Partner Violence: Not on file    Outpatient Medications Prior to Visit  Medication Sig Dispense Refill   clonazePAM (KLONOPIN) 0.5 MG tablet Take 1 tablet (0.5 mg total) by mouth 2 (two) times  daily as needed for anxiety. 20 tablet 0   hydrOXYzine (ATARAX/VISTARIL) 25 MG tablet TAKE 1 TABLET BY MOUTH THREE TIMES A DAY AS NEEDED FOR ANXIETY 270 tablet 1   lisinopril-hydrochlorothiazide (ZESTORETIC) 20-25 MG tablet TAKE 1 TABLET BY MOUTH EVERY DAY 90 tablet 1   topiramate (TOPAMAX) 25 MG tablet Take 1 tablet (25 mg total) by mouth 2 (two) times daily. 180 tablet 0   venlafaxine XR (EFFEXOR-XR) 150 MG 24 hr capsule Take 1 capsule (150 mg total) by mouth daily with breakfast. 90 capsule 1   venlafaxine XR (EFFEXOR-XR) 37.5 MG 24 hr capsule Take 1 capsule (37.5 mg total) by mouth daily with breakfast. 90 capsule 1   amphetamine-dextroamphetamine (ADDERALL XR) 30 MG 24 hr capsule Take 1 capsule (30 mg total) by mouth every morning. 30 capsule 0   amphetamine-dextroamphetamine (ADDERALL XR) 30 MG 24 hr capsule Take 1 capsule (30 mg total) by mouth every morning. 30 capsule 0   amphetamine-dextroamphetamine (ADDERALL XR) 30 MG 24 hr capsule Take 1 capsule (30 mg total) by mouth every morning.  30 capsule 0   amphetamine-dextroamphetamine (ADDERALL) 10 MG tablet Take 1 tablet (10 mg total) by mouth daily after lunch. 30 tablet 0   amphetamine-dextroamphetamine (ADDERALL) 10 MG tablet Take 1 tablet (10 mg total) by mouth daily after lunch. 30 tablet 0   amphetamine-dextroamphetamine (ADDERALL) 10 MG tablet Take 1 tablet (10 mg total) by mouth daily after lunch. 30 tablet 0   phentermine 15 MG capsule TAKE 1 CAPSULE BY MOUTH EVERY DAY IN THE MORNING 30 capsule 0   valACYclovir (VALTREX) 1000 MG tablet Take 2 tablets (2,000 mg total) by mouth 2 (two) times daily. X 1 day. ( 4 tabs per episode) 20 tablet 5   No facility-administered medications prior to visit.    No Known Allergies  ROS Review of Systems    Objective:    Physical Exam  BP 109/69   Pulse 75   Ht 5\' 7"  (1.702 m)   Wt 229 lb (103.9 kg)   SpO2 100%   BMI 35.87 kg/m  Wt Readings from Last 3 Encounters:  12/31/20 229 lb (103.9 kg)  12/03/20 227 lb (103 kg)  09/18/20 227 lb (103 kg)     Health Maintenance Due  Topic Date Due   Hepatitis C Screening  Never done   COVID-19 Vaccine (3 - Booster for Pfizer series) 01/22/2020    There are no preventive care reminders to display for this patient.  Lab Results  Component Value Date   TSH 2.540 06/05/2019   Lab Results  Component Value Date   WBC 5.9 12/31/2019   HGB 14.4 12/31/2019   HCT 41.5 12/31/2019   MCV 92.8 12/31/2019   PLT 320 12/31/2019   Lab Results  Component Value Date   NA 140 12/31/2019   K 4.3 12/31/2019   CO2 29 12/31/2019   GLUCOSE 96 12/31/2019   BUN 25 12/31/2019   CREATININE 0.62 12/31/2019   BILITOT 0.5 12/31/2019   ALKPHOS 74 06/05/2019   AST 13 12/31/2019   ALT 17 12/31/2019   PROT 7.1 12/31/2019   ALBUMIN 4.8 06/05/2019   CALCIUM 9.6 12/31/2019   Lab Results  Component Value Date   CHOL 234 (H) 06/05/2019   Lab Results  Component Value Date   HDL 54 06/05/2019   Lab Results  Component Value Date   LDLCALC  157 (H) 06/05/2019   Lab Results  Component Value Date   TRIG 127 06/05/2019  Lab Results  Component Value Date   CHOLHDL 3.6 06/28/2018   Lab Results  Component Value Date   HGBA1C 5.5 06/05/2019      Assessment & Plan:   Problem List Items Addressed This Visit       Cardiovascular and Mediastinum   Hypertension    Blood pressure looks phenomenal today.        Digestive   Cold sore    Refilled her medication today.      Relevant Medications   valACYclovir (VALTREX) 1000 MG tablet     Other   Encounter for weight management    Visit #: 5 Starting Weight: 252 lbs   Current weight: 229 lb Previous weight: 227 lbs Change in weight: Up 2 lbs Goal weight: 200 lbs Dietary goals: Continue to work on [employer] plans on starting DASH meal prep.  She is also working on increasing protein.Marland Kitchen   Exercise goals: Shoot for exercising at least 3 days/week.    Has started doing some home pool exercises.. Medication: Discussed options.  I like to see if her insurance would cover something like Saxenda I do agree that she has plateaued on the phentermine and we really cannot go up on her dose because she already takes Adderall that would significantly increase her risk for SVT.  I did go ahead and refill the phentermine for 1 more month just so she has some coverage while were waiting to figure out if her insurance will cover one of the newer GLP-1 options. Follow-up and referrals: 4 weeks nurse visit.       Relevant Medications   phentermine 15 MG capsule   Liraglutide -Weight Management (SAXENDA) 18 MG/3ML SOPN   Attention deficit hyperactivity disorder (ADHD), combined type    Doing well on current regimen.  Refills sent for 90 days.      Relevant Medications   amphetamine-dextroamphetamine (ADDERALL XR) 30 MG 24 hr capsule   amphetamine-dextroamphetamine (ADDERALL XR) 30 MG 24 hr capsule   amphetamine-dextroamphetamine (ADDERALL XR) 30 MG 24 hr capsule    amphetamine-dextroamphetamine (ADDERALL) 10 MG tablet   amphetamine-dextroamphetamine (ADDERALL) 10 MG tablet   amphetamine-dextroamphetamine (ADDERALL) 10 MG tablet   Other Visit Diagnoses     Essential hypertension    -  Primary   Relevant Orders   COMPLETE METABOLIC PANEL WITH GFR   Lipid panel   TSH   Class 2 severe obesity with serious comorbidity and body mass index (BMI) of 36.0 to 36.9 in adult, unspecified obesity type (HCC)       Relevant Medications   amphetamine-dextroamphetamine (ADDERALL XR) 30 MG 24 hr capsule   amphetamine-dextroamphetamine (ADDERALL XR) 30 MG 24 hr capsule   amphetamine-dextroamphetamine (ADDERALL XR) 30 MG 24 hr capsule   amphetamine-dextroamphetamine (ADDERALL) 10 MG tablet   amphetamine-dextroamphetamine (ADDERALL) 10 MG tablet   amphetamine-dextroamphetamine (ADDERALL) 10 MG tablet   phentermine 15 MG capsule   Liraglutide -Weight Management (SAXENDA) 18 MG/3ML SOPN   Other hyperlipidemia       Relevant Orders   COMPLETE METABOLIC PANEL WITH GFR   Lipid panel   TSH       Meds ordered this encounter  Medications   amphetamine-dextroamphetamine (ADDERALL XR) 30 MG 24 hr capsule    Sig: Take 1 capsule (30 mg total) by mouth every morning.    Dispense:  30 capsule    Refill:  0   amphetamine-dextroamphetamine (ADDERALL XR) 30 MG 24 hr capsule    Sig: Take 1 capsule (30 mg  total) by mouth every morning.    Dispense:  30 capsule    Refill:  0   amphetamine-dextroamphetamine (ADDERALL XR) 30 MG 24 hr capsule    Sig: Take 1 capsule (30 mg total) by mouth every morning.    Dispense:  30 capsule    Refill:  0   amphetamine-dextroamphetamine (ADDERALL) 10 MG tablet    Sig: Take 1 tablet (10 mg total) by mouth daily after lunch.    Dispense:  30 tablet    Refill:  0   amphetamine-dextroamphetamine (ADDERALL) 10 MG tablet    Sig: Take 1 tablet (10 mg total) by mouth daily after lunch.    Dispense:  30 tablet    Refill:  0    amphetamine-dextroamphetamine (ADDERALL) 10 MG tablet    Sig: Take 1 tablet (10 mg total) by mouth daily after lunch.    Dispense:  30 tablet    Refill:  0   phentermine 15 MG capsule    Sig: TAKE 1 CAPSULE BY MOUTH EVERY DAY IN THE MORNING    Dispense:  30 capsule    Refill:  0    Not to exceed 5 additional fills before 01/19/2021 DX Code Needed  .   valACYclovir (VALTREX) 1000 MG tablet    Sig: Take 2 tablets (2,000 mg total) by mouth 2 (two) times daily. X 1 day. ( 4 tabs per episode)    Dispense:  20 tablet    Refill:  5   Liraglutide -Weight Management (SAXENDA) 18 MG/3ML SOPN    Sig: Inject 0.6 mg into the skin daily for 7 days, THEN 1.2 mg daily for 7 days, THEN 1.8 mg daily for 14 days.    Dispense:  6.3 mL    Refill:  1    Follow-up: No follow-ups on file.    Beatrice Lecher, MD

## 2020-12-31 NOTE — Assessment & Plan Note (Signed)
Refilled her medication today.

## 2020-12-31 NOTE — Assessment & Plan Note (Signed)
Doing well on current regimen.  Refills sent for 90 days.

## 2020-12-31 NOTE — Assessment & Plan Note (Signed)
Visit #:5 Starting Weight: 252 lbs  Current weight:229 lb Previous weight:227 lbs Change in weight:Up 2 lbs Goal weight: 200 lbs Dietary goals:Continue to work on [employer] plans on startingDASHmeal prep.  She is also working on increasing protein.Marland Kitchen   Exercise goals:Shoot for exercising at least 3 days/week.   Has started doing some home pool exercises.. Medication:Discussed options.  I like to see if her insurance would cover something like Saxenda I do agree that she has plateaued on the phentermine and we really cannot go up on her dose because she already takes Adderall that would significantly increase her risk for SVT.  I did go ahead and refill the phentermine for 1 more month just so she has some coverage while were waiting to figure out if her insurance will cover one of the newer GLP-1 options. Follow-up and referrals:4 weeks nurse visit.

## 2020-12-31 NOTE — Assessment & Plan Note (Signed)
Blood pressure looks phenomenal today. 

## 2021-01-01 ENCOUNTER — Encounter: Payer: Self-pay | Admitting: Family Medicine

## 2021-01-01 LAB — COMPLETE METABOLIC PANEL WITH GFR
AG Ratio: 1.6 (calc) (ref 1.0–2.5)
ALT: 21 U/L (ref 6–29)
AST: 16 U/L (ref 10–35)
Albumin: 4.7 g/dL (ref 3.6–5.1)
Alkaline phosphatase (APISO): 68 U/L (ref 37–153)
BUN: 19 mg/dL (ref 7–25)
CO2: 26 mmol/L (ref 20–32)
Calcium: 10.2 mg/dL (ref 8.6–10.4)
Chloride: 104 mmol/L (ref 98–110)
Creat: 0.8 mg/dL (ref 0.50–1.03)
Globulin: 3 g/dL (calc) (ref 1.9–3.7)
Glucose, Bld: 112 mg/dL — ABNORMAL HIGH (ref 65–99)
Potassium: 4.8 mmol/L (ref 3.5–5.3)
Sodium: 141 mmol/L (ref 135–146)
Total Bilirubin: 0.4 mg/dL (ref 0.2–1.2)
Total Protein: 7.7 g/dL (ref 6.1–8.1)
eGFR: 89 mL/min/{1.73_m2} (ref >=60)

## 2021-01-01 LAB — LIPID PANEL
Cholesterol: 201 mg/dL — ABNORMAL HIGH (ref <200)
HDL: 64 mg/dL (ref >=50)
LDL Cholesterol (Calc): 120 mg/dL (calc) — ABNORMAL HIGH
Non-HDL Cholesterol (Calc): 137 mg/dL (calc) — ABNORMAL HIGH (ref <130)
Total CHOL/HDL Ratio: 3.1 (calc) (ref <5.0)
Triglycerides: 77 mg/dL (ref <150)

## 2021-01-01 LAB — TSH: TSH: 2.88 mIU/L

## 2021-01-01 NOTE — Progress Notes (Signed)
Hi Andrea Marquez, your metabolic panel overall looks good except your glucose was 112.  Your total cholesterol and LDL look better this year compared to 2 years ago.  Great work.  I do think the exercises been really helping your numbers.  Your thyroid looks good.

## 2021-03-06 ENCOUNTER — Ambulatory Visit (INDEPENDENT_AMBULATORY_CARE_PROVIDER_SITE_OTHER): Payer: 59

## 2021-03-06 ENCOUNTER — Other Ambulatory Visit: Payer: Self-pay

## 2021-03-06 ENCOUNTER — Ambulatory Visit: Payer: 59 | Admitting: Sports Medicine

## 2021-03-06 DIAGNOSIS — M5416 Radiculopathy, lumbar region: Secondary | ICD-10-CM

## 2021-03-06 DIAGNOSIS — M545 Low back pain, unspecified: Secondary | ICD-10-CM

## 2021-03-06 DIAGNOSIS — Z23 Encounter for immunization: Secondary | ICD-10-CM | POA: Diagnosis not present

## 2021-03-06 DIAGNOSIS — B029 Zoster without complications: Secondary | ICD-10-CM

## 2021-03-06 MED ORDER — GABAPENTIN 300 MG PO CAPS: ORAL_CAPSULE | ORAL | 3 refills | Status: DC

## 2021-03-06 MED ORDER — PREDNISONE 50 MG PO TABS
ORAL_TABLET | ORAL | 0 refills | Status: DC
Start: 2021-03-06 — End: 2021-03-19

## 2021-03-06 NOTE — Addendum Note (Signed)
Addended by: Gust Brooms on: 03/06/2021 04:17 PM   Modules accepted: Orders

## 2021-03-06 NOTE — Progress Notes (Signed)
° ° °  Procedures performed today:    None.  Independent interpretation of notes and tests performed by another provider:   None.  Brief History, Exam, Impression, and Recommendations:    Left lumbar radiculitis Pleasant 51 year old female, she has a history of lumbar DDD, she did well with an L4-L5 transforaminal epidural back in 2014, unfortunately having recurrence of pain in her back with radiation down the buttock, thigh, knee, to the heel. We will start conservatively, 5 days of prednisone, updated lumbar spine x-rays, she is having nocturnal pain so adding Neurontin as well. Return to see me after 6 weeks of home physical therapy, and we will proceed with updated MRI and epidural if not better. She really does need to work with her PCP on weight loss, we discussed GLP-1s.    ___________________________________________ Gwen Her. Dianah Field, M.D., ABFM., CAQSM. Primary Care and [place] Instructor of [place] of [facility] of Medicine

## 2021-03-06 NOTE — Assessment & Plan Note (Signed)
Shingrix #2 today

## 2021-03-06 NOTE — Assessment & Plan Note (Signed)
Andrea Marquez 51 year old female, she has a history of lumbar DDD, she did well with an L4-L5 transforaminal epidural back in 2014, unfortunately having recurrence of pain in her back with radiation down the buttock, thigh, knee, to the heel. We will start conservatively, 5 days of prednisone, updated lumbar spine x-rays, she is having nocturnal pain so adding Neurontin as well. Return to see me after 6 weeks of home physical therapy, and we will proceed with updated MRI and epidural if not better. She really does need to work with her PCP on weight loss, we discussed GLP-1s.

## 2021-03-18 ENCOUNTER — Other Ambulatory Visit: Payer: Self-pay | Admitting: Sports Medicine

## 2021-03-18 ENCOUNTER — Encounter: Payer: Self-pay | Admitting: Sports Medicine

## 2021-03-18 DIAGNOSIS — M5416 Radiculopathy, lumbar region: Secondary | ICD-10-CM

## 2021-03-19 MED ORDER — PREDNISONE 10 MG (48) PO TBPK
ORAL_TABLET | Freq: Every day | ORAL | 0 refills | Status: DC
Start: 2021-03-19 — End: 2021-04-17

## 2021-04-03 ENCOUNTER — Encounter: Payer: Self-pay | Admitting: Sports Medicine

## 2021-04-03 DIAGNOSIS — M5416 Radiculopathy, lumbar region: Secondary | ICD-10-CM

## 2021-04-03 NOTE — Telephone Encounter (Signed)
MRI ordered for lumbar radiculopathy, symptoms present for years, multiple visits, greater than 6 weeks of physical therapy and physician directed conservative treatment, epidurals have helped in the past, need updated MRI for new epidural. ?

## 2021-04-12 ENCOUNTER — Other Ambulatory Visit: Payer: Self-pay

## 2021-04-12 ENCOUNTER — Ambulatory Visit (INDEPENDENT_AMBULATORY_CARE_PROVIDER_SITE_OTHER): Payer: 59

## 2021-04-12 DIAGNOSIS — M5416 Radiculopathy, lumbar region: Secondary | ICD-10-CM | POA: Diagnosis not present

## 2021-04-14 ENCOUNTER — Encounter: Payer: Self-pay | Admitting: Sports Medicine

## 2021-04-14 DIAGNOSIS — M5416 Radiculopathy, lumbar region: Secondary | ICD-10-CM

## 2021-04-16 ENCOUNTER — Ambulatory Visit
Admission: RE | Admit: 2021-04-16 | Discharge: 2021-04-16 | Disposition: A | Discharge status: Home / Self Care | Payer: 59 | Source: Ambulatory Visit | Attending: Sports Medicine | Admitting: Sports Medicine

## 2021-04-16 ENCOUNTER — Other Ambulatory Visit: Payer: Self-pay

## 2021-04-16 DIAGNOSIS — M5416 Radiculopathy, lumbar region: Secondary | ICD-10-CM

## 2021-04-16 MED ORDER — METHYLPREDNISOLONE ACETATE 40 MG/ML INJ SUSP (RADIOLOG
80.0000 mg | Freq: Once | INTRAMUSCULAR | Status: AC
  Administered 2021-04-16: 80 mg via EPIDURAL

## 2021-04-16 MED ORDER — IOPAMIDOL (ISOVUE-M 200) INJECTION 41%
1.0000 mL | Freq: Once | INTRAMUSCULAR | Status: AC
  Administered 2021-04-16: 1 mL via EPIDURAL

## 2021-04-16 NOTE — Discharge Instructions (Signed)

## 2021-04-17 ENCOUNTER — Ambulatory Visit: Payer: 59 | Admitting: Sports Medicine

## 2021-04-17 DIAGNOSIS — M5416 Radiculopathy, lumbar region: Secondary | ICD-10-CM

## 2021-04-17 NOTE — Progress Notes (Signed)
? ? ?  Procedures performed today:   ? ?None. ? ?Independent interpretation of notes and tests performed by another provider:  ? ?None. ? ?Brief History, Exam, Impression, and Recommendations:   ? ?Left lumbar radiculitis ?Pleasant 51 year old female, known lumbar DDD, historically did well with an L4-L5 transforaminal epidural back in 2014, recurrence of back pain, she did not improve with prednisone, home physical therapy. ?We did an MRI, proceeded with another epidural which she just had yesterday. ?Today she had a lot of questions and we answered them all, she does need to give it 4 to 7 days before feeling efficacy, she will also work aggressively on weight loss. ?Her husband was here and we went over the treatment protocol, 20 pound lifting restriction. ?Adding advanced herniated disc conditioning, return to see me in 4 to 6 weeks. ? ?Of note she did have some swelling in her left knee. ? ?I spent 30 minutes of total time managing this patient today, this includes chart review, face to face, and non-face to face time. ? ?___________________________________________ ?Gwen Her. Dianah Field, M.D., ABFM., CAQSM. ?Primary Care and Sports Medicine ?[place] ? ?Adjunct Instructor of Family Medicine  ?[employer] ?

## 2021-04-17 NOTE — Assessment & Plan Note (Signed)
Pleasant 51 year old female, known lumbar DDD, historically did well with an L4-L5 transforaminal epidural back in 2014, recurrence of back pain, she did not improve with prednisone, home physical therapy. ?We did an MRI, proceeded with another epidural which she just had yesterday. ?Today she had a lot of questions and we answered them all, she does need to give it 4 to 7 days before feeling efficacy, she will also work aggressively on weight loss. ?Her husband was here and we went over the treatment protocol, 20 pound lifting restriction. ?Adding advanced herniated disc conditioning, return to see me in 4 to 6 weeks. ? ?Of note she did have some swelling in her left knee. ?

## 2021-05-14 ENCOUNTER — Ambulatory Visit: Payer: 59 | Admitting: Family Medicine

## 2021-05-22 ENCOUNTER — Ambulatory Visit: Payer: 59 | Admitting: Sports Medicine

## 2021-05-22 DIAGNOSIS — M47816 Spondylosis without myelopathy or radiculopathy, lumbar region: Secondary | ICD-10-CM

## 2021-05-22 MED ORDER — TRAMADOL HCL 50 MG PO TABS
50.0000 mg | ORAL_TABLET | Freq: Three times a day (TID) | ORAL | 0 refills | Status: DC | PRN
Start: 2021-05-22 — End: 2021-06-19

## 2021-05-22 NOTE — Progress Notes (Signed)
? ? ?  Procedures performed today:   ? ?None. ? ?Independent interpretation of notes and tests performed by another provider:  ? ?None. ? ?Brief History, Exam, Impression, and Recommendations:   ? ?Lumbar spondylosis ?This is a very pleasant 51 year old female, she has known lumbar DDD and lumbar facet arthritis, historically she has done well with lumbar epidurals, we just set her up for another epidural and unfortunately she got no relief, not even temporary, she does have predominantly left-sided pain and her MRI did show significant multilevel facet arthritis so I am going to set her up with multilevel left lower lumbar facet injections from L2-S1. ?Adding tramadol for pain relief in the meantime, return to see me 1 month after the facet joint injections. ?She will continue with aquatic therapy at home and some Pilates which seems to be helping significantly. ? ?Chronic process with pharmacologic intervention and exacerbation. ? ?___________________________________________ ?Gwen Her. Dianah Field, M.D., ABFM., CAQSM. ?Primary Care and Sports Medicine ?[place] ? ?Adjunct Instructor of Family Medicine  ?[employer] ?

## 2021-05-22 NOTE — Assessment & Plan Note (Signed)
This is a very pleasant 51 year old female, she has known lumbar DDD and lumbar facet arthritis, historically she has done well with lumbar epidurals, we just set her up for another epidural and unfortunately she got no relief, not even temporary, she does have predominantly left-sided pain and her MRI did show significant multilevel facet arthritis so I am going to set her up with multilevel left lower lumbar facet injections from L2-S1. ?Adding tramadol for pain relief in the meantime, return to see me 1 month after the facet joint injections. ?She will continue with aquatic therapy at home and some Pilates which seems to be helping significantly. ?

## 2021-06-01 ENCOUNTER — Other Ambulatory Visit: Payer: Self-pay | Admitting: Sports Medicine

## 2021-06-01 DIAGNOSIS — M47816 Spondylosis without myelopathy or radiculopathy, lumbar region: Secondary | ICD-10-CM

## 2021-06-08 ENCOUNTER — Other Ambulatory Visit: Payer: Self-pay | Admitting: Family Medicine

## 2021-06-08 DIAGNOSIS — Z1231 Encounter for screening mammogram for malignant neoplasm of breast: Secondary | ICD-10-CM

## 2021-06-09 ENCOUNTER — Ambulatory Visit
Admission: RE | Admit: 2021-06-09 | Discharge: 2021-06-09 | Disposition: A | Discharge status: Home / Self Care | Payer: 59 | Source: Ambulatory Visit | Attending: Sports Medicine | Admitting: Sports Medicine

## 2021-06-09 DIAGNOSIS — M47816 Spondylosis without myelopathy or radiculopathy, lumbar region: Secondary | ICD-10-CM

## 2021-06-09 MED ORDER — METHYLPREDNISOLONE ACETATE 40 MG/ML INJ SUSP (RADIOLOG
80.0000 mg | Freq: Once | INTRAMUSCULAR | Status: AC
  Administered 2021-06-09: 80 mg via INTRA_ARTICULAR

## 2021-06-09 MED ORDER — IOPAMIDOL (ISOVUE-M 200) INJECTION 41%
1.0000 mL | Freq: Once | INTRAMUSCULAR | Status: AC
Start: 2021-06-09 — End: 2021-06-09
  Administered 2021-06-09: 1 mL via INTRA_ARTICULAR

## 2021-06-09 NOTE — Discharge Instructions (Signed)

## 2021-06-10 ENCOUNTER — Ambulatory Visit
Admission: RE | Admit: 2021-06-10 | Discharge: 2021-06-10 | Disposition: A | Discharge status: Home / Self Care | Payer: 59 | Source: Ambulatory Visit | Attending: Family Medicine | Admitting: Family Medicine

## 2021-06-10 DIAGNOSIS — Z1231 Encounter for screening mammogram for malignant neoplasm of breast: Secondary | ICD-10-CM

## 2021-06-10 NOTE — Progress Notes (Signed)
Please call patient. Normal mammogram.  Repeat in 1 year.  

## 2021-06-19 ENCOUNTER — Ambulatory Visit: Payer: 59 | Admitting: Sports Medicine

## 2021-06-19 DIAGNOSIS — M47816 Spondylosis without myelopathy or radiculopathy, lumbar region: Secondary | ICD-10-CM | POA: Diagnosis not present

## 2021-06-19 NOTE — Progress Notes (Signed)
    Procedures performed today:    None.  Independent interpretation of notes and tests performed by another provider:   None.  Brief History, Exam, Impression, and Recommendations:    Lumbar spondylosis Pleasant 51 year old female, multilevel lumbar spondylosis, with disc disease and facet arthritis, she has had epidurals in the past that have done well, typically gets an excellent response from a left-sided L4-L5 transforaminal. More recently she was having a different kind of pain, an epidural was ineffective, she also had left-sided L2-S1 facet arthritis, we targeted the left L2-S1 facets and she returns today with fantastic relief. She would like to get back into running, I have recommended softer surfaces. If she has a recurrence of pain we could set her up again for L2-S1 facet joint injections on the left, we also discussed radiofrequency ablation as a longer term solution.    ___________________________________________ Gwen Her. Dianah Field, M.D., ABFM., CAQSM. Primary Care and [place] Instructor of [place] [facility] of Medicine

## 2021-06-19 NOTE — Assessment & Plan Note (Signed)
Pleasant 51 year old female, multilevel lumbar spondylosis, with disc disease and facet arthritis, she has had epidurals in the past that have done well, typically gets an excellent response from a left-sided L4-L5 transforaminal. More recently she was having a different kind of pain, an epidural was ineffective, she also had left-sided L2-S1 facet arthritis, we targeted the left L2-S1 facets and she returns today with fantastic relief. She would like to get back into running, I have recommended softer surfaces. If she has a recurrence of pain we could set her up again for L2-S1 facet joint injections on the left, we also discussed radiofrequency ablation as a longer term solution.

## 2021-07-03 ENCOUNTER — Telehealth: Payer: Self-pay

## 2021-07-03 NOTE — Telephone Encounter (Signed)
Patient aware of Dr T recommendation and was transferred to the front desk to schedule with a partner here.

## 2021-07-03 NOTE — Telephone Encounter (Signed)
Not likely but any one of my partners can handle a swollen knee.  Other option if she wants specialist intervention is Dr. Clearance Coots at [facility] or Dr. Burnard Bunting at [facility].

## 2021-07-03 NOTE — Telephone Encounter (Signed)
Patient called to report that the "lump on her leg" is getting bigger and her knee is swollen to 2-3 times it's normal size. She will be icing it. She wanted to know if you could work her in before you leave.

## 2021-07-06 ENCOUNTER — Ambulatory Visit: Payer: 59 | Admitting: Family Medicine

## 2021-07-06 ENCOUNTER — Ambulatory Visit (INDEPENDENT_AMBULATORY_CARE_PROVIDER_SITE_OTHER): Payer: 59

## 2021-07-06 ENCOUNTER — Encounter: Payer: Self-pay | Admitting: Family Medicine

## 2021-07-06 VITALS — BP 141/93 | HR 75 | Ht 67.0 in | Wt 249.0 lb

## 2021-07-06 DIAGNOSIS — M79605 Pain in left leg: Secondary | ICD-10-CM

## 2021-07-06 DIAGNOSIS — M25562 Pain in left knee: Secondary | ICD-10-CM

## 2021-07-06 DIAGNOSIS — M25462 Effusion, left knee: Secondary | ICD-10-CM

## 2021-07-06 DIAGNOSIS — M79652 Pain in left thigh: Secondary | ICD-10-CM

## 2021-07-06 DIAGNOSIS — M7989 Other specified soft tissue disorders: Secondary | ICD-10-CM | POA: Diagnosis not present

## 2021-07-06 DIAGNOSIS — I10 Essential (primary) hypertension: Secondary | ICD-10-CM

## 2021-07-06 DIAGNOSIS — M5416 Radiculopathy, lumbar region: Secondary | ICD-10-CM

## 2021-07-06 MED ORDER — PREDNISONE 20 MG PO TABS: 40.0000 mg | ORAL_TABLET | Freq: Every day | ORAL | 0 refills | Status: DC

## 2021-07-06 MED ORDER — TRAMADOL HCL 50 MG PO TABS: 50.0000 mg | ORAL_TABLET | Freq: Two times a day (BID) | ORAL | 0 refills | Status: AC | PRN

## 2021-07-06 NOTE — Patient Instructions (Signed)
F/U 2 weeks for BP check with nurse

## 2021-07-06 NOTE — Progress Notes (Signed)
Hi Andrea Marquez.  Great news!  No sign of a blood clot which is great.  They did see some extra fluid on the joint above the kneecap.  I do think Dr. Darene Lamer could probably draw some fluid off that knee and it may really help with some of your discomfort and pain but again I know you are still hurting from your back as well so he could address that at the same time.  I hope the prednisone helps for the next several days.

## 2021-07-06 NOTE — Progress Notes (Signed)
Acute Office Visit  Subjective:     Patient ID: Andrea Marquez, female    DOB: [birth year], 51 y.o.   MRN: [id number]  Chief Complaint  Patient presents with   Edema    HPI Patient is in today for swelling in left leg.  He feels like it swollen behind the knee and up into her thigh.  It is painful to fully flex and extend.  She says she had similar swelling and discomfort when her left lumbar radiculopathy was really flaring.  She ended up having an epidural that really did not help and then 2 weeks later ended up doing for facet injections.  She did get relief for about 2 weeks but it has gradually felt like it is coming back and now she is noticing the pain particularly behind the left thigh just above the posterior knee.  She is also have some pain radiating around the outside of that hip as well.  She is also noticed swelling around the knee.  She has been taking a little bit of tramadol at night occasionally but has been trying to avoid using it because of how it makes her feel she did try taking gabapentin with it because she is just not sleeping well because of the discomfort and pain.  She actually has to lay on her left side and hip to get comfortable.  ROS      Objective:    BP (!) 141/93   Pulse 75   Ht '5\' 7"'$  (1.702 m)   Wt 249 lb (112.9 kg)   SpO2 98%   BMI 39.00 kg/m    Physical Exam Vitals reviewed.  Constitutional:      Appearance: She is well-developed.  HENT:     Head: Normocephalic and atraumatic.  Eyes:     Conjunctiva/sclera: Conjunctivae normal.  Cardiovascular:     Rate and Rhythm: Normal rate.  Pulmonary:     Effort: Pulmonary effort is normal.  Musculoskeletal:     Comments: Knee is noticeably swollen compared to the right.  She also has what feels like a lipoma over the posterior thigh just above her knee.  I do not see any evidence of a Baker's cyst.  She is tender over this area.  Unable to fully flex her left knee compared to her right.   Skin:    General: Skin is dry.     Coloration: Skin is not pale.  Neurological:     Mental Status: She is alert and oriented to person, place, and time.  Psychiatric:        Behavior: Behavior normal.     No results found for any visits on 07/06/21.      Assessment & Plan:   Problem List Items Addressed This Visit       Cardiovascular and Mediastinum   Hypertension   Other Visit Diagnoses     Left leg pain    -  Primary   Relevant Orders   US Venous Img Lower Unilateral Left   Left thigh pain       Relevant Orders   US Venous Img Lower Unilateral Left   Pain and swelling of left knee       Relevant Orders   US Venous Img Lower Unilateral Left   Left lumbar radiculitis           Left knee swelling with pain at the posterior thigh distally-discussed potential causes.  She feels like it is directly related to her  back as she had similar symptoms previously and then it got better after her facet injections for at least 2 weeks.  We did discuss getting a Doppler just to rule out DVT.  Also consider she may have some other issues such as a Baker's cyst.  We also discussed a trial of prednisone at least for the short-term to help with her pain and discomfort and then getting back in with Dr. Darene Lamer after he returns from vacation to see when she might be able to get facet injections again.  She also plans on doing some research to see if she can get a list of surgeons in case it ends up moving more toward surgery for treatment.  Meds ordered this encounter  Medications   predniSONE (DELTASONE) 20 MG tablet    Sig: Take 2 tablets (40 mg total) by mouth daily with breakfast.    Dispense:  10 tablet    Refill:  0   traMADol (ULTRAM) 50 MG tablet    Sig: Take 1-2 tablets (50-100 mg total) by mouth 2 (two) times daily as needed for up to 5 days.    Dispense:  15 tablet    Refill:  0    No follow-ups on file.  Beatrice Lecher, MD

## 2021-07-21 ENCOUNTER — Ambulatory Visit (INDEPENDENT_AMBULATORY_CARE_PROVIDER_SITE_OTHER): Payer: 59 | Admitting: Family Medicine

## 2021-07-21 VITALS — BP 148/92 | HR 97

## 2021-07-21 DIAGNOSIS — I10 Essential (primary) hypertension: Secondary | ICD-10-CM | POA: Diagnosis not present

## 2021-07-21 DIAGNOSIS — R9431 Abnormal electrocardiogram [ECG] [EKG]: Secondary | ICD-10-CM

## 2021-07-21 DIAGNOSIS — R079 Chest pain, unspecified: Secondary | ICD-10-CM | POA: Diagnosis not present

## 2021-07-21 MED ORDER — LISINOPRIL-HYDROCHLOROTHIAZIDE 10-12.5 MG PO TABS: 1.0000 | ORAL_TABLET | Freq: Every day | ORAL | 0 refills | Status: DC

## 2021-07-21 NOTE — Progress Notes (Signed)
   Acute Office Visit  Subjective:     Patient ID: Andrea Marquez, female    DOB: April 16, 1970, 51 y.o.   MRN: [id number]  Chief Complaint  Patient presents with   Blood Pressure Check    HPI Patient is in today for blood pressure check.  She mentioned during the visit today that she started having some chest pressure this afternoon she drank some water and thought maybe it just went down wrong.  She has had some persistent pain.  No prior history of cardiac disease.  She denies feeling lightheaded or dizzy.  ROS      Objective:    BP (!) 148/92   Pulse 97   SpO2 100%    Physical Exam  No results found for any visits on 07/21/21.      Assessment & Plan:   Problem List Items Addressed This Visit       Cardiovascular and Mediastinum   Hypertension    Still elevated today.  We will go ahead and start lisinopril HCT will need to follow-up in 2 weeks with nurse visit in addition to a BMP at that time.      Relevant Medications   lisinopril-hydrochlorothiazide (ZESTORETIC) 10-12.5 MG tablet   Other Visit Diagnoses     Chest pain, unspecified type    -  Primary   Essential hypertension       Relevant Medications   lisinopril-hydrochlorothiazide (ZESTORETIC) 10-12.5 MG tablet   Abnormal EKG          Chest pain-she does have a history of hypertension and hyperlipidemia but no prior history of cardiac disease.  We did go ahead and do a EKG on her today.  EKG today shows rate of 78 bpm, normal sinus rhythm.  Compared to prior EKG from 2021 she has a change in lead III and aVF.  She also has poor R wave progression in the lateral leads.  No ST elevation.  I am concerned about the EKG changes I recommend that she goes to the emergency department.  She is not having any lightheadedness or dizziness.  And she says she is going to go straight there.  Think she will need further work-up with blood work etc.  Hopefully they are able to rule out any acute findings and then we will  need to consider referral to cardiology afterwards.  In addition to getting her blood pressure under better control.  Meds ordered this encounter  Medications   lisinopril-hydrochlorothiazide (ZESTORETIC) 10-12.5 MG tablet    Sig: Take 1 tablet by mouth daily.    Dispense:  90 tablet    Refill:  0    Return in about 2 weeks (around 08/04/2021) for blood pressure check. Nani Gasser, MD

## 2021-07-23 ENCOUNTER — Ambulatory Visit (INDEPENDENT_AMBULATORY_CARE_PROVIDER_SITE_OTHER): Payer: 59

## 2021-07-23 ENCOUNTER — Telehealth: Payer: Self-pay | Admitting: *Deleted

## 2021-07-23 ENCOUNTER — Ambulatory Visit: Payer: 59 | Admitting: Sports Medicine

## 2021-07-23 DIAGNOSIS — M47816 Spondylosis without myelopathy or radiculopathy, lumbar region: Secondary | ICD-10-CM

## 2021-07-23 DIAGNOSIS — M25562 Pain in left knee: Secondary | ICD-10-CM | POA: Diagnosis not present

## 2021-07-23 DIAGNOSIS — M25462 Effusion, left knee: Secondary | ICD-10-CM

## 2021-07-23 NOTE — Assessment & Plan Note (Signed)
Morbidly obese, if we are going to save her back and her knees she will need to lose 50+ pounds. I would like her to discuss this and potentially Wegovy with Dr. Madilyn Fireman.

## 2021-07-23 NOTE — Progress Notes (Signed)
    Procedures performed today:    Procedure: Real-time Ultrasound Guided injection of left knee Device: Samsung HS60  Verbal informed consent obtained.  Time-out conducted.  Noted no overlying erythema, induration, or other signs of local infection.  Skin prepped in a sterile fashion.  Local anesthesia: Topical Ethyl chloride.  With sterile technique and under real time ultrasound guidance: Trace effusion noted, 1 cc Kenalog 40, 2 cc lidocaine, 2 cc bupivacaine injected easily Completed without difficulty  Advised to call if fevers/chills, erythema, induration, drainage, or persistent bleeding.  Images permanently stored and available for review in PACS.  Impression: Technically successful ultrasound guided injection.  Independent interpretation of notes and tests performed by another provider:   None.  Brief History, Exam, Impression, and Recommendations:    Pain and swelling of left knee Very pleasant 51 year old female, morbid obesity, having some swelling and pain left knee. Today we did an injection, she does have a treadmill stress test coming up in about an hour. She will wear a sleeve. Return to see me in 6 weeks.  Lumbar spondylosis 51 year old female, multilevel lumbar spondylosis, disc disease, facet arthritis, she has had epidurals in the past and has done well, typically gets a good response from left-sided L4-L5 transforaminal epidurals, at the last visit earlier this year she had a different type of pain, we did an epidural again which was ineffective, we then switched to left-sided L2-S1 facet joint injections and she had fantastic relief. It is probably time to set her up for L2-S1 facet RFA, I would like Dr. Francesco Runner to weigh in but just for consultation.  Morbid obesity ([place]) Morbidly obese, if we are going to save her back and her knees she will need to lose 50+ pounds. I would like her to discuss this and potentially Wegovy with Dr.  Madilyn Fireman.    ____________________________________________ Andrea Her. Dianah Field, M.D., ABFM., CAQSM., AME. Primary Care and Sports Medicine [facility]  Adjunct Professor of [place] [employer] [facility] of Medicine  Risk manager

## 2021-07-23 NOTE — Telephone Encounter (Signed)
Pt stopped by and dropped off her EKG for you to review from when she was seen  at the ED and [facility] on 6/27 for CP. She stated that she had came here for bp check and c/o cp and had an EKG done and was told to go to the ED for a cardiac work up. She stated that the EKG that was done at the ED was a "mirrored copy"?

## 2021-07-23 NOTE — Assessment & Plan Note (Signed)
51 year old female, multilevel lumbar spondylosis, disc disease, facet arthritis, she has had epidurals in the past and has done well, typically gets a good response from left-sided L4-L5 transforaminal epidurals, at the last visit earlier this year she had a different type of pain, we did an epidural again which was ineffective, we then switched to left-sided L2-S1 facet joint injections and she had fantastic relief. It is probably time to set her up for L2-S1 facet RFA, I would like Dr. Francesco Runner to weigh in but just for consultation.

## 2021-07-23 NOTE — Assessment & Plan Note (Addendum)
Very pleasant 51 year old female, morbid obesity, having some swelling and pain left knee. Today we did an injection, she does have a treadmill stress test coming up in about an hour. She will wear a sleeve. Return to see me in 6 weeks.

## 2021-07-23 NOTE — Addendum Note (Signed)
Addended by: Silverio Decamp on: 07/23/2021 09:04 AM   Modules accepted: Orders

## 2021-07-24 NOTE — Telephone Encounter (Signed)
Left message for patient to return call.

## 2021-07-24 NOTE — Telephone Encounter (Signed)
That means they flipped a couple of the leads to get a slightly different view of the EKG.  It does look more reassuring and I am glad everything checked out.  I would still consider a cardiology referral for further evaluation.  It does not need to be urgent but I think we could consider at least a consultation just to make sure that were not missing anything if she is okay with that.  Hopefully she has had a chance to pick up the lisinopril HCT and get started on that to reduce blood pressure as well.

## 2021-07-27 NOTE — Addendum Note (Signed)
Addended by: Narda Rutherford on: 07/27/2021 11:43 AM   Modules accepted: Orders

## 2021-07-29 NOTE — Telephone Encounter (Signed)
Patient advised of message. Patient stated she has already met with Cardiology and they are moving forward for her to have a stress test done.

## 2021-08-10 ENCOUNTER — Ambulatory Visit (INDEPENDENT_AMBULATORY_CARE_PROVIDER_SITE_OTHER): Payer: 59 | Admitting: Family Medicine

## 2021-08-10 ENCOUNTER — Encounter: Payer: Self-pay | Admitting: Family Medicine

## 2021-08-10 VITALS — BP 126/76 | HR 66 | Ht 67.0 in | Wt 255.0 lb

## 2021-08-10 DIAGNOSIS — Z7689 Persons encountering health services in other specified circumstances: Secondary | ICD-10-CM

## 2021-08-10 DIAGNOSIS — I10 Essential (primary) hypertension: Secondary | ICD-10-CM

## 2021-08-10 MED ORDER — WEGOVY 0.25 MG/0.5ML ~~LOC~~ SOAJ: SUBCUTANEOUS | 0 refills | Status: DC

## 2021-08-10 NOTE — Assessment & Plan Note (Signed)
Visit #:1 Starting Weight:255 lbs  Current weight:255lb Previous weight: Change in weight: Goal weight:200 lbs Dietary goals:Continue to work on [employer] plans  Exercise goals:Shoot for exercising at least 3 days/week. Medication:Discussed options.  Not sure if insurance will cover a GLP-1.  With everything going on with her heart recommend avoiding stimulants.  We will go ahead and send over prescription for [facility] and we will see if it is covered.  She can certainly look to see if there are any online coupons that provide additional discount if it is covered and available.  We are currently struggling with a backorder issue.  If not covered then we could see if Saxenda would be an option as well.  Did discuss that it is a daily versus weekly shot.  We discussed how the GLP class actually works to reduce hunger and promote weight loss. Follow-up and referrals:6 - 8 weeks

## 2021-08-10 NOTE — Assessment & Plan Note (Signed)
Well controlled. Continue current regimen. Follow up in  6 mo  

## 2021-08-10 NOTE — Progress Notes (Signed)
Established Patient Office Visit  Subjective   Patient ID: Andrea Marquez, female    DOB: [birth year]  Age: 51 y.o. MRN: [id number]  Chief Complaint  Patient presents with   Hypertension    HPI  Hypertension- Pt denies chest pain, SOB, dizziness, or heart palpitations.  Taking meds as directed w/o problems.  Denies medication side effects.    After her recent episode of chest pain and abnormal EKG she currently has a heart monitor in place we will get that off next Tuesday she has a stress test scheduled for July 28.  Would really like to discuss options for weight loss as well.  Ever since starting the Lamictal which has been helpful she has noticed an increase in appetite.  She has been doing some research and has found some healthy recipes.  She is hoping to make some changes for her and her for her daughter to get more healthy.    ROS    Objective:     BP 126/76   Pulse 66   Ht '5\' 7"'$  (1.702 m)   Wt 255 lb (115.7 kg)   SpO2 98%   BMI 39.94 kg/m    Physical Exam Vitals and nursing note reviewed.  Constitutional:      Appearance: She is well-developed.  HENT:     Head: Normocephalic and atraumatic.  Cardiovascular:     Rate and Rhythm: Normal rate and regular rhythm.     Heart sounds: Normal heart sounds.  Pulmonary:     Effort: Pulmonary effort is normal.     Breath sounds: Normal breath sounds.  Skin:    General: Skin is warm and dry.  Neurological:     Mental Status: She is alert and oriented to person, place, and time.  Psychiatric:        Behavior: Behavior normal.      No results found for any visits on 08/10/21.    The 10-year ASCVD risk score (Arnett DK, et al., 2019) is: 1.8%    Assessment & Plan:   Problem List Items Addressed This Visit       Cardiovascular and Mediastinum   Hypertension    Well controlled. Continue current regimen. Follow up in  6 mo       Relevant Orders   BASIC METABOLIC PANEL WITH GFR     Other    Encounter for weight management - Primary    Visit #: 1 Starting Weight: 255 lbs   Current weight: 255 lb Previous weight:  Change in weight:  Goal weight: 200 lbs Dietary goals: Continue to work on [employer] plans  Exercise goals: Shoot for exercising at least 3 days/week.    Medication: Discussed options.  Not sure if insurance will cover a GLP-1.  With everything going on with her heart recommend avoiding stimulants.  We will go ahead and send over prescription for [facility] and we will see if it is covered.  She can certainly look to see if there are any online coupons that provide additional discount if it is covered and available.  We are currently struggling with a backorder issue.  If not covered then we could see if Saxenda would be an option as well.  Did discuss that it is a daily versus weekly shot.  We discussed how the GLP class actually works to reduce hunger and promote weight loss. Follow-up and referrals: 6 - 8 weeks      Relevant Medications   Semaglutide-Weight Management (WEGOVY) 0.25  MG/0.5ML SOAJ   Other Visit Diagnoses     Essential hypertension       Relevant Orders   BASIC METABOLIC PANEL WITH GFR       Return in about 6 weeks (around 09/21/2021) for New start medication.    Beatrice Lecher, MD

## 2021-08-11 LAB — BASIC METABOLIC PANEL WITH GFR
BUN: 15 mg/dL (ref 7–25)
CO2: 31 mmol/L (ref 20–32)
Calcium: 9.8 mg/dL (ref 8.6–10.4)
Chloride: 106 mmol/L (ref 98–110)
Creat: 0.68 mg/dL (ref 0.50–1.03)
Glucose, Bld: 97 mg/dL (ref 65–99)
Potassium: 4.9 mmol/L (ref 3.5–5.3)
Sodium: 144 mmol/L (ref 135–146)
eGFR: 104 mL/min/{1.73_m2} (ref >=60)

## 2021-08-11 NOTE — Progress Notes (Signed)
Your lab work is within acceptable range and there are no concerning findings.   ?

## 2021-08-20 ENCOUNTER — Telehealth: Payer: Self-pay

## 2021-08-20 NOTE — Telephone Encounter (Signed)
Initiated Prior authorization ZSW:FUXNAT 0.'25MG'$ /0.5ML auto-injectors Via: Covermymeds Case/Key:[id number] Status: approved  as of 08/20/21 Reason:WEGOVY INJ 0.'25MG'$  is approved through 12/21/2021. Notified Pt via: Mychart

## 2021-09-02 ENCOUNTER — Encounter (INDEPENDENT_AMBULATORY_CARE_PROVIDER_SITE_OTHER): Payer: Self-pay

## 2021-09-03 ENCOUNTER — Ambulatory Visit (INDEPENDENT_AMBULATORY_CARE_PROVIDER_SITE_OTHER): Payer: 59 | Admitting: Sports Medicine

## 2021-09-03 ENCOUNTER — Encounter: Payer: Self-pay | Admitting: Sports Medicine

## 2021-09-03 DIAGNOSIS — M25562 Pain in left knee: Secondary | ICD-10-CM

## 2021-09-03 DIAGNOSIS — M25462 Effusion, left knee: Secondary | ICD-10-CM | POA: Diagnosis not present

## 2021-09-03 NOTE — Assessment & Plan Note (Signed)
Doing much better after injection at the last visit. Needs to work aggressively on weight loss.

## 2021-09-03 NOTE — Assessment & Plan Note (Signed)
Morbidly obese, looks like Wegovy did get approved. She is having trouble finding the initial doses, I did inform her that the boot leg [facility] was still available and could be a jumpstart until she can find the higher doses of Wegovy. We will leave this up to her primary care provider.

## 2021-09-03 NOTE — Progress Notes (Signed)
    Procedures performed today:    None.  Independent interpretation of notes and tests performed by another provider:   None.  Brief History, Exam, Impression, and Recommendations:    Pain and swelling of left knee Doing much better after injection at the last visit. Needs to work aggressively on weight loss.  Morbid obesity ([place]) Morbidly obese, looks like Wegovy did get approved. She is having trouble finding the initial doses, I did inform her that the boot leg version [facility] was still available and could be a jumpstart until she can find the higher doses of Wegovy. We will leave this up to her primary care provider.    ____________________________________________ Gwen Her. Dianah Field, M.D., ABFM., CAQSM., AME. Primary Care and Sports Medicine [facility] [place]  Adjunct Professor of [place] [employer] [facility] of Medicine  Risk manager

## 2021-09-21 ENCOUNTER — Ambulatory Visit (INDEPENDENT_AMBULATORY_CARE_PROVIDER_SITE_OTHER): Payer: 59 | Admitting: Family Medicine

## 2021-09-21 ENCOUNTER — Encounter: Payer: Self-pay | Admitting: Family Medicine

## 2021-09-21 VITALS — BP 109/76 | HR 73 | Ht 68.0 in | Wt 251.0 lb

## 2021-09-21 DIAGNOSIS — Z7689 Persons encountering health services in other specified circumstances: Secondary | ICD-10-CM | POA: Diagnosis not present

## 2021-09-21 DIAGNOSIS — F902 Attention-deficit hyperactivity disorder, combined type: Secondary | ICD-10-CM

## 2021-09-21 DIAGNOSIS — R5383 Other fatigue: Secondary | ICD-10-CM

## 2021-09-21 MED ORDER — AMPHETAMINE-DEXTROAMPHETAMINE 10 MG PO TABS: 10.0000 mg | ORAL_TABLET | Freq: Two times a day (BID) | ORAL | 0 refills | Status: DC

## 2021-09-21 MED ORDER — AMPHETAMINE-DEXTROAMPHET ER 30 MG PO CP24: 30.0000 mg | ORAL_CAPSULE | ORAL | 0 refills | Status: DC

## 2021-09-21 MED ORDER — WEGOVY 0.25 MG/0.5ML ~~LOC~~ SOAJ: SUBCUTANEOUS | 0 refills | Status: AC

## 2021-09-21 NOTE — Assessment & Plan Note (Signed)
Restart medication since cardiac work-up thus far looks good and blood pressures look phenomenal.  Seen to monitor carefully when she restarts the medication.  Get could help with some of the energy level and focus that she struggling with right now.  But if that does not improve after restarting the Adderall then we could do further work-up for the fatigue.

## 2021-09-21 NOTE — Assessment & Plan Note (Signed)
Visit #:2 Starting Weight:255 lbs  Current weight:251lb Previous weight:255 lbs Change in weight:Down 4 lbs  Goal weight:200 lbs Dietary goals:Continue to work on [employer].  Continue to work on paying attention to stress eating triggers and working on those. Exercise goals:Shoot for exercising at least 3 days/week. Medication:trying to get [facility]  New rx sent to [facility]. If not covered then we could see if Saxenda would be an option as well.  Did discuss that it is a daily versus weekly shot. Follow-up and referrals:6 - 8 weeks

## 2021-09-21 NOTE — Progress Notes (Signed)
Established Patient Office Visit  Subjective   Patient ID: Andrea Marquez, female    DOB: Oct 26, 1970  Age: 51 y.o. MRN: [id number]  Chief Complaint  Patient presents with   Follow-up    HPI  Follow-up ADD-she would like to try to get back on her medication now that she has had cardiac work-up and evaluation she would like to restart the medication.  Blood pressures have looked fantastic.  She did is scheduled for an echocardiogram tomorrow and they have planned on scheduling her for sleep study later in the fall.  Weight management-she still has not been able to get the [facility].  It is continued to be on backorder.  The pharmacy initially said they would have it in a couple of weeks but they did not.  It is now been over a month.  She has been making some changes on her own and is down about 8 pounds.  He also reports that she is just felt more tired in general over the last 2 months it just felt harder to get going and get motivated.  Normally she is full of energy.  In fact in the last week or so she has not wanted to get out of bed because of the lack of energy.  But she does not necessarily feel like she is more sad or down.    ROS    Objective:     BP 109/76   Pulse 73   Ht '5\' 8"'$  (1.727 m)   Wt 251 lb (113.9 kg)   SpO2 99%   BMI 38.16 kg/m    Physical Exam Vitals and nursing note reviewed.  Constitutional:      Appearance: She is well-developed.  HENT:     Head: Normocephalic and atraumatic.  Cardiovascular:     Rate and Rhythm: Normal rate and regular rhythm.     Heart sounds: Normal heart sounds.  Pulmonary:     Effort: Pulmonary effort is normal.     Breath sounds: Normal breath sounds.  Skin:    General: Skin is warm and dry.  Neurological:     Mental Status: She is alert and oriented to person, place, and time.  Psychiatric:        Behavior: Behavior normal.      No results found for any visits on 09/21/21.    The 10-year ASCVD risk score  (Arnett DK, et al., 2019) is: 1.4%    Assessment & Plan:   Problem List Items Addressed This Visit       Other   Encounter for weight management    Visit #: 2 Starting Weight: 255 lbs   Current weight: 251 lb Previous weight: 255 lbs Change in weight: Down 4 lbs  Goal weight: 200 lbs Dietary goals: Continue to work on [employer].  Continue to work on paying attention to stress eating triggers and working on those. Exercise goals: Shoot for exercising at least 3 days/week.    Medication: trying to get [facility].  New rx sent to [facility]. If not covered then we could see if Saxenda would be an option as well.  Did discuss that it is a daily versus weekly shot. Follow-up and referrals: 6 - 8 weeks      Relevant Medications   Semaglutide-Weight Management (WEGOVY) 0.25 MG/0.5ML SOAJ   Attention deficit hyperactivity disorder (ADHD), combined type - Primary    Restart medication since cardiac work-up thus far looks good and blood pressures look phenomenal.  Seen to monitor carefully when she restarts the medication.  Get could help with some of the energy level and focus that she struggling with right now.  But if that does not improve after restarting the Adderall then we could do further work-up for the fatigue.      Other Visit Diagnoses     Other fatigue          Teague-please see note under ADHD.   No follow-ups on file.    Beatrice Lecher, MD

## 2021-10-15 ENCOUNTER — Other Ambulatory Visit: Payer: Self-pay | Admitting: Family Medicine

## 2021-10-15 DIAGNOSIS — I10 Essential (primary) hypertension: Secondary | ICD-10-CM

## 2021-10-15 MED ORDER — LISINOPRIL-HYDROCHLOROTHIAZIDE 10-12.5 MG PO TABS: 1.0000 | ORAL_TABLET | Freq: Every day | ORAL | 1 refills | Status: DC

## 2021-10-15 NOTE — Telephone Encounter (Signed)
Last visit 09/21/2021 last fill 09/21/21. Dated the prescriptions for 10/21/21.

## 2021-10-16 MED ORDER — AMPHETAMINE-DEXTROAMPHETAMINE 10 MG PO TABS: 10.0000 mg | ORAL_TABLET | Freq: Two times a day (BID) | ORAL | 0 refills | Status: DC

## 2021-10-16 MED ORDER — AMPHETAMINE-DEXTROAMPHET ER 30 MG PO CP24: 30.0000 mg | ORAL_CAPSULE | ORAL | 0 refills | Status: DC

## 2021-12-02 ENCOUNTER — Other Ambulatory Visit: Payer: Self-pay | Admitting: Family Medicine

## 2021-12-03 MED ORDER — AMPHETAMINE-DEXTROAMPHET ER 30 MG PO CP24: 30.0000 mg | ORAL_CAPSULE | ORAL | 0 refills | Status: DC

## 2021-12-14 NOTE — Therapy (Signed)
OUTPATIENT PHYSICAL THERAPY THORACOLUMBAR EVALUATION   Patient Name: Andrea Marquez MRN: [id number] DOB:1970-05-14, 51 y.o., female Today's Date: 12/15/2021  END OF SESSION:  PT End of Session - 12/15/21 1013     Visit Number 1    Date for PT Re-Evaluation 01/26/22    Authorization Type [employer]    PT Start Time 4098    PT Stop Time 1102    PT Time Calculation (min) 47 min    Activity Tolerance Patient tolerated treatment well    Behavior During Therapy WFL for tasks assessed/performed             Past Medical History:  Diagnosis Date   ADHD    Anxiety    Atrial tachycardia    Back pain    Depression    severe   Female infertility    GERD (gastroesophageal reflux disease)    Hyperlipidemia    Hypertension    Obesity    Palpitations    PONV (postoperative nausea and vomiting)    Prediabetes    Severe anxiety    Vertigo    Past Surgical History:  Procedure Laterality Date   AUGMENTATION MAMMAPLASTY     BREAST ENHANCEMENT SURGERY     LAPAROSCOPY     NSVD     x2   OOPHORECTOMY     Single    TOTAL VAGINAL HYSTERECTOMY     TUBAL LIGATION     Patient Active Problem List   Diagnosis Date Noted   Pain and swelling of left knee 07/23/2021   Encounter for weight management 06/20/2020   Acute stress reaction 04/16/2020   Vertigo 11/20/2019   Seasonal allergies 05/17/2019   Cold sore 04/05/2019   Atrial tachycardia 08/23/2018   Hyperhidrosis 07/18/2018   Stress incontinence 07/18/2018   Frequent urination 07/18/2018   Hematuria 10/24/2017   Attention deficit hyperactivity disorder (ADHD), combined type 11/91/4782   Synovial plica syndrome of right knee 04/29/2016   Severe episode of recurrent major depressive disorder, without psychotic features ([place]) 08/21/2015   Generalized anxiety disorder 08/21/2015   Hypertension 04/07/2015   Shingles 05/05/2012   Lumbar spondylosis 03/17/2012   Morbid obesity ([place]) 03/21/2008   INSOMNIA 06/20/2007   Depression with  anxiety 04/19/2007    PCP: Hali Marry, MD  REFERRING PROVIDER: [facility]  REFERRING DIAG: M54.16 (ICD-10-CM) - Radiculopathy, lumbar region  Rationale for Evaluation and Treatment: Rehabilitation  THERAPY DIAG:  Radiculopathy, lumbar region  Muscle weakness (generalized)  Cramp and spasm  ONSET DATE: March 2023  SUBJECTIVE:  SUBJECTIVE STATEMENT: Patient reports left LE sciatica starting in March. Pain was a 10/10 and now down to a 6/10. Always needs assist getting up from a squat position. Facet injections helped but epidural did not. She has not been able to run since pain started. Left leg is weak especially with stairs. Pt reports limitations in sitting/standing/walking of 30 min/45 min/2 miles.   PERTINENT HISTORY:  Hysterectomy, anxiety, HTN  PAIN:  Are you having pain? Yes: NPRS scale: 6/10 Pain location: left low back hip to leg to back of the knee 3/10 Pain description: pressure low back; burn Aggravating factors: sitting, cooking and prolonged standing Relieving factors: meds a little, prone fig 4 sometimes  PRECAUTIONS: None  WEIGHT BEARING RESTRICTIONS: No  FALLS:  Has patient fallen in last 6 months? No  LIVING ENVIRONMENT: Lives with: lives with their family Lives in: House/apartment Stairs: Yes: Internal: 13 steps; can reach both and External: 8 steps; can reach both Has following equipment at home:  uses crawling if she has been active cleaning; must use rail and None  OCCUPATION: on the computer  PLOF: Independent  PATIENT GOALS: get back to running, normalizing ADLS  NEXT MD VISIT: none scheduled  OBJECTIVE:   DIAGNOSTIC FINDINGS:  MRI 04/14/21 - IMPRESSION: 1. Lumbar spine degeneration primarily affecting the facets and especially  at L4-5 where there is anterolisthesis and active left facet arthritis. 2. Chronic and noncompressive disc protrusions at T11-12 and L3-4.    SCREENING FOR RED FLAGS: Bowel or bladder incontinence: No Spinal tumors: No Cauda equina syndrome: No Compression fracture: No Abdominal aneurysm: No  COGNITION: Overall cognitive status: Within functional limits for tasks assessed     SENSATION: WFL  MUSCLE LENGTH: HS: mild tighness bil Quads: ITB: Piriformis: mild tightness R Hip Flexors: WNL Heelcords:WNL   POSTURE: weight shift right and tight R QL  PALPATION: unremarkable   LUMBAR ROM:   AROM eval  Flexion full  Extension full  Right lateral flexion full  Left lateral flexion full  Right rotation full  Left rotation Full but mild tightness   (Blank rows = not tested)  LOWER EXTREMITY ROM:   WNL  LOWER EXTREMITY MMT:    MMT Right eval Left eval  Hip flexion 4+ 4+  Hip extension 5 5  Hip abduction 5 5  Hip adduction 5 5  Hip internal rotation    Hip external rotation    Knee flexion 5 5  Knee extension 5 5  Ankle dorsiflexion  5  Ankle plantarflexion    Ankle inversion  5  Ankle eversion  5   (Blank rows = not tested)  LUMBAR SPECIAL TESTS:  Straight leg raise test: Negative, Slump test: Negative, FABER test: Negative, and Thomas test: Negative Prone to 3/10, Prone on elbows to 1/10, prone press ups 2x 10 2-3 in left low back  FUNCTIONAL TESTS: Squat: shifts R SL Squat: with UE support WNL  1/2 kneel to stand: must use hand to push through thigh     TODAY'S TREATMENT:  DATE:   12/15/21 See Pt ed  PATIENT EDUCATION:  Education details: PT eval findings, anticipated POC, and initial HEP  Person educated: Patient Education method: Explanation, Demonstration, Verbal cues, and Handouts Education comprehension: verbalized  understanding and returned demonstration  HOME EXERCISE PROGRAM: Access Code: [email or web address] URL: [email or web address] Date: 12/15/2021 Prepared by: Almyra Free  Exercises - Prone Press Up  - 3-4 x daily - 7 x weekly - 1-3 sets - 10 reps - Lunge with Counter Support  - 1 x daily - 7 x weekly - 3 sets - 10 reps  Patient Education - Posture and Body Mechanics  ASSESSMENT:  CLINICAL IMPRESSION: Patient is a 51 y.o. female who was seen today for physical therapy evaluation and treatment for left lumbar radiculopathy. She has full lumbar ROM but is limited with sitting, standing and walking by pain in left low back and leg. Additionally patient can no longer run for exercise and has functional weakness with stairs, squats and floor to stand transfers. She is able to abolish leg pain with prone press ups. Patient will benefit from skilled PT to address these deficits.     OBJECTIVE IMPAIRMENTS: decreased activity tolerance, decreased strength, impaired flexibility, and pain.   ACTIVITY LIMITATIONS: squatting, transfers, and running  PARTICIPATION LIMITATIONS:  pain with most ADLS  PERSONAL FACTORS: 3+ comorbidities: Hysterectomy, anxiety, HTN  are also affecting patient's functional outcome.   REHAB POTENTIAL: Excellent  CLINICAL DECISION MAKING: Stable/uncomplicated  EVALUATION COMPLEXITY: Low   GOALS: Goals reviewed with patient? Yes  SHORT TERM GOALS: Target date: 12/29/2021 (Remove Blue Hyperlink)  Patient will be independent with initial HEP.  Baseline:  Goal status: INITIAL  2.  Patient will report centralization of radicular symptoms.  Baseline:  Goal status: INITIAL    LONG TERM GOALS: Target date: 01/26/2022  (Remove Blue Hyperlink)  Patient will be independent with advanced/ongoing HEP to improve outcomes and carryover.  Baseline:  Goal status: INITIAL  2.  Patient will report 75% improvement in low back pain to improve QOL.  Baseline:  Goal status:  INITIAL  3.  Patient will be able to sit with equal WB and no increased sx.   Baseline:  Goal status: INITIAL  4.  Patient will demonstrate improved functional strength as demonstrated by ability to squat and perform floor to stand transfer on L LE without difficulty. Baseline:  Goal status: INITIAL  5.   Patient will tolerate 45-60 min of standing and walking to perform normal ADLS including cooking. Baseline:  Goal status: INITIAL  7.  Patient able to return to running without radicular sx into L LE.  Baseline:  Goal status: INITIAL  PLAN:  PT FREQUENCY: 2x/week  PT DURATION: 6 weeks  PLANNED INTERVENTIONS: Therapeutic exercises, Therapeutic activity, Neuromuscular re-education, Balance training, Gait training, Patient/Family education, Self Care, Joint mobilization, Aquatic Therapy, Dry Needling, Electrical stimulation, Spinal mobilization, Cryotherapy, Moist heat, Taping, Traction, Ultrasound, Ionotophoresis '4mg'$ /ml Dexamethasone, and Manual therapy.  PLAN FOR NEXT SESSION: Assess response to extension protocol, work on core strength, functional LE strength   Kahlyn Shippey, PT 12/15/2021, 2:49 PM

## 2021-12-15 ENCOUNTER — Encounter: Payer: Self-pay | Admitting: Physical Therapy

## 2021-12-15 ENCOUNTER — Ambulatory Visit: Payer: 59 | Attending: Family Medicine | Admitting: Physical Therapy

## 2021-12-15 ENCOUNTER — Other Ambulatory Visit: Payer: Self-pay

## 2021-12-15 DIAGNOSIS — M5416 Radiculopathy, lumbar region: Secondary | ICD-10-CM | POA: Insufficient documentation

## 2021-12-15 DIAGNOSIS — R252 Cramp and spasm: Secondary | ICD-10-CM | POA: Insufficient documentation

## 2021-12-15 DIAGNOSIS — M6281 Muscle weakness (generalized): Secondary | ICD-10-CM | POA: Insufficient documentation

## 2021-12-21 ENCOUNTER — Other Ambulatory Visit (HOSPITAL_COMMUNITY): Payer: Self-pay

## 2021-12-21 ENCOUNTER — Other Ambulatory Visit: Payer: Self-pay | Admitting: Family Medicine

## 2021-12-21 MED ORDER — AMPHETAMINE-DEXTROAMPHET ER 30 MG PO CP24
30.0000 mg | ORAL_CAPSULE | ORAL | 0 refills | Status: DC
  Filled 2021-12-21 (×2): qty 30, 30d supply, fill #0

## 2021-12-21 NOTE — Telephone Encounter (Signed)
Last OV: 09/21/21 Next OV: no appt scheduled Last RF: 12/03/21

## 2021-12-22 ENCOUNTER — Ambulatory Visit: Payer: 59 | Admitting: Rehabilitative and Restorative Service Providers"

## 2021-12-22 ENCOUNTER — Other Ambulatory Visit (HOSPITAL_COMMUNITY): Payer: Self-pay

## 2021-12-22 ENCOUNTER — Encounter: Payer: Self-pay | Admitting: Rehabilitative and Restorative Service Providers"

## 2021-12-22 DIAGNOSIS — R252 Cramp and spasm: Secondary | ICD-10-CM

## 2021-12-22 DIAGNOSIS — M5416 Radiculopathy, lumbar region: Secondary | ICD-10-CM | POA: Diagnosis not present

## 2021-12-22 DIAGNOSIS — M6281 Muscle weakness (generalized): Secondary | ICD-10-CM

## 2021-12-22 NOTE — Therapy (Signed)
OUTPATIENT PHYSICAL THERAPY THORACOLUMBAR EVALUATION   Patient Name: Andrea Marquez MRN: [id number] DOB:[birth year], 51 y.o., female Today's Date: 12/22/2021  END OF SESSION:  PT End of Session - 12/22/21 0804     Visit Number 2    Date for PT Re-Evaluation 01/26/22    Authorization Type U[employer]    PT Start Time 0800    PT Stop Time 0848    PT Time Calculation (min) 48 min    Activity Tolerance Patient tolerated treatment well             Past Medical History:  Diagnosis Date   ADHD    Anxiety    Atrial tachycardia    Back pain    Depression    severe   Female infertility    GERD (gastroesophageal reflux disease)    Hyperlipidemia    Hypertension    Obesity    Palpitations    PONV (postoperative nausea and vomiting)    Prediabetes    Severe anxiety    Vertigo    Past Surgical History:  Procedure Laterality Date   AUGMENTATION MAMMAPLASTY     BREAST ENHANCEMENT SURGERY     LAPAROSCOPY     NSVD     x2   OOPHORECTOMY     Single    TOTAL VAGINAL HYSTERECTOMY     TUBAL LIGATION     Patient Active Problem List   Diagnosis Date Noted   Pain and swelling of left knee 07/23/2021   Encounter for weight management 06/20/2020   Acute stress reaction 04/16/2020   Vertigo 11/20/2019   Seasonal allergies 05/17/2019   Cold sore 04/05/2019   Atrial tachycardia 08/23/2018   Hyperhidrosis 07/18/2018   Stress incontinence 07/18/2018   Frequent urination 07/18/2018   Hematuria 10/24/2017   Attention deficit hyperactivity disorder (ADHD), combined type 87/86/7672   Synovial plica syndrome of right knee 04/29/2016   Severe episode of recurrent major depressive disorder, without psychotic features ([place]) 08/21/2015   Generalized anxiety disorder 08/21/2015   Hypertension 04/07/2015   Shingles 05/05/2012   Lumbar spondylosis 03/17/2012   Morbid obesity ([place]) 03/21/2008   INSOMNIA 06/20/2007   Depression with anxiety 04/19/2007    PCP: Hali Marry,  MD  REFERRING PROVIDER: [facility]  REFERRING DIAG: M54.16 (ICD-10-CM) - Radiculopathy, lumbar region  Rationale for Evaluation and Treatment: Rehabilitation  THERAPY DIAG:  Radiculopathy, lumbar region  Muscle weakness (generalized)  Cramp and spasm  ONSET DATE: March 2023  SUBJECTIVE:  SUBJECTIVE STATEMENT: 12/22/21: Patient reports that she has been busy over the holiday and has not had much time to do exercises. The exercises do help with she does them. Pain in the Lt LE continues.   Patient reports left LE sciatica starting in March. Pain was a 10/10 and now down to a 6/10. Always needs assist getting up from a squat position. Facet injections helped but epidural did not. She has not been able to run since pain started. Left leg is weak especially with stairs. Pt reports limitations in sitting/standing/walking of 30 min/45 min/2 miles.   PERTINENT HISTORY:  Hysterectomy, anxiety, HTN  PAIN:  Are you having pain? Yes: NPRS scale: 8/10 Pain location: left low back hip to leg to back of the knee 3/10 Pain description: pressure low back; burn Aggravating factors: sitting, cooking and prolonged standing Relieving factors: meds a little, prone fig 4 sometimes  PRECAUTIONS: None  WEIGHT BEARING RESTRICTIONS: No  FALLS:  Has patient fallen in last 6 months? No  LIVING ENVIRONMENT: Lives with: lives with their family Lives in: House/apartment Stairs: Yes: Internal: 13 steps; can reach both and External: 8 steps; can reach both Has following equipment at home:  uses crawling if she has been active cleaning; must use rail and None  OCCUPATION: on the computer  PLOF: Independent  PATIENT GOALS: get back to running, normalizing ADLS  NEXT MD VISIT: none scheduled  OBJECTIVE:    DIAGNOSTIC FINDINGS:  MRI 04/14/21 - IMPRESSION: 1. Lumbar spine degeneration primarily affecting the facets and especially at L4-5 where there is anterolisthesis and active left facet arthritis. 2. Chronic and noncompressive disc protrusions at T11-12 and L3-4.    SCREENING FOR RED FLAGS: Bowel or bladder incontinence: No Spinal tumors: No Cauda equina syndrome: No Compression fracture: No Abdominal aneurysm: No  COGNITION: Overall cognitive status: Within functional limits for tasks assessed     SENSATION: WFL  MUSCLE LENGTH: HS: mild tighness bil Quads: ITB: Piriformis: mild tightness R Hip Flexors: WNL Heelcords:WNL   POSTURE: weight shift right and tight R QL  PALPATION: unremarkable   LUMBAR ROM:   AROM eval  Flexion full  Extension full  Right lateral flexion full  Left lateral flexion full  Right rotation full  Left rotation Full but mild tightness   (Blank rows = not tested)  LOWER EXTREMITY ROM:   WNL  LOWER EXTREMITY MMT:    MMT Right eval Left eval  Hip flexion 4+ 4+  Hip extension 5 5  Hip abduction 5 5  Hip adduction 5 5  Hip internal rotation    Hip external rotation    Knee flexion 5 5  Knee extension 5 5  Ankle dorsiflexion  5  Ankle plantarflexion    Ankle inversion  5  Ankle eversion  5   (Blank rows = not tested)  LUMBAR SPECIAL TESTS:  Straight leg raise test: Negative, Slump test: Negative, FABER test: Negative, and Thomas test: Negative Prone to 3/10, Prone on elbows to 1/10, prone press ups 2x 10 2-3 in left low back  FUNCTIONAL TESTS: Squat: shifts R SL Squat: with UE support WNL  1/2 kneel to stand: must use hand to push through thigh     TODAY'S TREATMENT:  DATE:   12/22/21:  Therapeutic exercises:  Standing trunk extension 2-3 sec x 3  Extension at counter 3 sec x 2 reps  Prone  press up 2-3 sec x 10 sag at end range  Core - 4 part supine 10 sec x 10   Manual work: Spinal mobs Grade II/III lumbar PA  Soft tissue mobilization Lt posterior hip   Trigger Point Dry-Needling  Treatment instructions: Expect mild to moderate muscle soreness. S/S of pneumothorax if dry needled over a lung field, and to seek immediate medical attention should they occur. Patient verbalized understanding of these instructions and education.  Patient Consent Given: Yes Education handout provided: Yes Muscles treated: Lt piriformis; gluts  Electrical stimulation performed: Yes Parameters: mAmp current to pt tolerance  Treatment response/outcome: decreased palpable tightness    Modalities:  Moist heat back/hp x 10 min   12/15/21 See Pt ed  PATIENT EDUCATION:  Education details: PT eval findings, anticipated POC, and initial HEP  Person educated: Patient Education method: Explanation, Demonstration, Verbal cues, and Handouts Education comprehension: verbalized understanding and returned demonstration  HOME EXERCISE PROGRAM: Access Code: [email or web address] URL: [email or web address] Date: 12/22/2021 Prepared by: Gillermo Murdoch  Exercises - Prone Press Up  - 3-4 x daily - 7 x weekly - 1-3 sets - 10 reps - Lunge with Counter Support  - 1 x daily - 7 x weekly - 3 sets - 10 reps - Standing Lumbar Extension  - 2 x daily - 7 x weekly - 1 sets - 2-3 reps - 2-3 sec  hold - Standing Back Extension at Wall  - 2 x daily - 7 x weekly - 1 sets - 3 reps - 2-3 sec  hold - Standing Lumbar Extension with Counter  - 2 x daily - 7 x weekly - 1 sets - 3 reps - 2-3 sec  hold - Supine Transversus Abdominis Bracing with Pelvic Floor Contraction  - 2 x daily - 7 x weekly - 1 sets - 10 reps - 10sec  hold Patient Education - Posture and Body Mechanics - Dry needling   ASSESSMENT:  CLINICAL IMPRESSION: 12/22/21: patient reports continued radicular symptoms Lt LE. She has not been able to exercise  consistently but plans to begin consistent exercises now that the holidays are over. Trial of DN and manual work lumbar spine and Lt posterior hip with good decrease in palpable tightness and report of decrease in radicular Lt LE symptoms. Progressed extension program and added core stabilization exercise.   Eval: Patient is a 51 y.o. female who was seen today for physical therapy evaluation and treatment for left lumbar radiculopathy. She has full lumbar ROM but is limited with sitting, standing and walking by pain in left low back and leg. Additionally patient can no longer run for exercise and has functional weakness with stairs, squats and floor to stand transfers. She is able to abolish leg pain with prone press ups. Patient will benefit from skilled PT to address these deficits.     OBJECTIVE IMPAIRMENTS: decreased activity tolerance, decreased strength, impaired flexibility, and pain.   ACTIVITY LIMITATIONS: squatting, transfers, and running  PARTICIPATION LIMITATIONS:  pain with most ADLS  PERSONAL FACTORS: 3+ comorbidities: Hysterectomy, anxiety, HTN  are also affecting patient's functional outcome.   REHAB POTENTIAL: Excellent  CLINICAL DECISION MAKING: Stable/uncomplicated  EVALUATION COMPLEXITY: Low   GOALS: Goals reviewed with patient? Yes  SHORT TERM GOALS: Target date: 12/29/2021 (Remove Blue Hyperlink)  Patient will be independent with initial HEP.  Baseline:  Goal status: INITIAL  2.  Patient will report centralization of radicular symptoms.  Baseline:  Goal status: INITIAL    LONG TERM GOALS: Target date: 01/26/2022  (Remove Blue Hyperlink)  Patient will be independent with advanced/ongoing HEP to improve outcomes and carryover.  Baseline:  Goal status: INITIAL  2.  Patient will report 75% improvement in low back pain to improve QOL.  Baseline:  Goal status: INITIAL  3.  Patient will be able to sit with equal WB and no increased sx.   Baseline:  Goal  status: INITIAL  4.  Patient will demonstrate improved functional strength as demonstrated by ability to squat and perform floor to stand transfer on L LE without difficulty. Baseline:  Goal status: INITIAL  5.   Patient will tolerate 45-60 min of standing and walking to perform normal ADLS including cooking. Baseline:  Goal status: INITIAL  7.  Patient able to return to running without radicular sx into L LE.  Baseline:  Goal status: INITIAL  PLAN:  PT FREQUENCY: 2x/week  PT DURATION: 6 weeks  PLANNED INTERVENTIONS: Therapeutic exercises, Therapeutic activity, Neuromuscular re-education, Balance training, Gait training, Patient/Family education, Self Care, Joint mobilization, Aquatic Therapy, Dry Needling, Electrical stimulation, Spinal mobilization, Cryotherapy, Moist heat, Taping, Traction, Ultrasound, Ionotophoresis '4mg'$ /ml Dexamethasone, and Manual therapy.  PLAN FOR NEXT SESSION: Assess response to extension protocol, work on core strength, functional LE strength   [place], PT 12/22/2021, 8:04 AM

## 2021-12-23 NOTE — Therapy (Signed)
OUTPATIENT PHYSICAL THERAPY THORACOLUMBAR EVALUATION   Patient Name: Andrea Marquez MRN: [id number] DOB:1970/04/02, 51 y.o., female Today's Date: 12/24/2021  END OF SESSION:  PT End of Session - 12/24/21 0759     Visit Number 3    Date for PT Re-Evaluation 01/26/22    Authorization Type U[employer]    PT Start Time 0800    PT Stop Time 0845    PT Time Calculation (min) 45 min    Activity Tolerance Patient tolerated treatment well    Behavior During Therapy WFL for tasks assessed/performed             Past Medical History:  Diagnosis Date   ADHD    Anxiety    Atrial tachycardia    Back pain    Depression    severe   Female infertility    GERD (gastroesophageal reflux disease)    Hyperlipidemia    Hypertension    Obesity    Palpitations    PONV (postoperative nausea and vomiting)    Prediabetes    Severe anxiety    Vertigo    Past Surgical History:  Procedure Laterality Date   AUGMENTATION MAMMAPLASTY     BREAST ENHANCEMENT SURGERY     LAPAROSCOPY     NSVD     x2   OOPHORECTOMY     Single    TOTAL VAGINAL HYSTERECTOMY     TUBAL LIGATION     Patient Active Problem List   Diagnosis Date Noted   Pain and swelling of left knee 07/23/2021   Encounter for weight management 06/20/2020   Acute stress reaction 04/16/2020   Vertigo 11/20/2019   Seasonal allergies 05/17/2019   Cold sore 04/05/2019   Atrial tachycardia 08/23/2018   Hyperhidrosis 07/18/2018   Stress incontinence 07/18/2018   Frequent urination 07/18/2018   Hematuria 10/24/2017   Attention deficit hyperactivity disorder (ADHD), combined type 61/47/0929   Synovial plica syndrome of right knee 04/29/2016   Severe episode of recurrent major depressive disorder, without psychotic features ([place]) 08/21/2015   Generalized anxiety disorder 08/21/2015   Hypertension 04/07/2015   Shingles 05/05/2012   Lumbar spondylosis 03/17/2012   Morbid obesity ([place]) 03/21/2008   INSOMNIA 06/20/2007   Depression with  anxiety 04/19/2007    PCP: Hali Marry, MD  REFERRING PROVIDER: [facility]  REFERRING DIAG: M54.16 (ICD-10-CM) - Radiculopathy, lumbar region  Rationale for Evaluation and Treatment: Rehabilitation  THERAPY DIAG:  Radiculopathy, lumbar region  Muscle weakness (generalized)  Cramp and spasm  ONSET DATE: March 2023  SUBJECTIVE:  SUBJECTIVE STATEMENT: Walking is feeling better. I'm not limping this morning. The needling made me looser but I still have burning in the left hip and midway down thigh.   Eval: Patient reports left LE sciatica starting in March. Pain was a 10/10 and now down to a 6/10. Always needs assist getting up from a squat position. Facet injections helped but epidural did not. She has not been able to run since pain started. Left leg is weak especially with stairs. Pt reports limitations in sitting/standing/walking of 30 min/45 min/2 miles.   PERTINENT HISTORY:  Hysterectomy, anxiety, HTN  PAIN:  Are you having pain? Yes: NPRS scale: 7/10 Pain location: left low back hip to leg to back of the mid thigh Pain description: pressure low back; burn Aggravating factors: sitting, cooking and prolonged standing Relieving factors: meds a little, prone fig 4 sometimes  PRECAUTIONS: None   LIVING ENVIRONMENT: Lives with: lives with their family Lives in: House/apartment Stairs: Yes: Internal: 13 steps; can reach both and External: 8 steps; can reach both Has following equipment at home:  uses crawling if she has been active cleaning; must use rail and None  OCCUPATION: on the computer  PLOF: Independent  PATIENT GOALS: get back to running, normalizing ADLS  NEXT MD VISIT: none scheduled  OBJECTIVE:   DIAGNOSTIC FINDINGS:  MRI 04/14/21 - IMPRESSION: 1.  Lumbar spine degeneration primarily affecting the facets and especially at L4-5 where there is anterolisthesis and active left facet arthritis. 2. Chronic and noncompressive disc protrusions at T11-12 and L3-4.   MUSCLE LENGTH: HS: mild tighness bil Quads: ITB: Piriformis: mild tightness R Hip Flexors: WNL Heelcords:WNL   POSTURE: weight shift right and tight R QL  LUMBAR ROM:   AROM eval  Flexion full  Extension full  Right lateral flexion full  Left lateral flexion full  Right rotation full  Left rotation Full but mild tightness   (Blank rows = not tested)  LOWER EXTREMITY ROM:   WNL  LOWER EXTREMITY MMT:    MMT Right eval Left eval  Hip flexion 4+ 4+  Hip extension 5 5  Hip abduction 5 5  Hip adduction 5 5  Hip internal rotation    Hip external rotation    Knee flexion 5 5  Knee extension 5 5  Ankle dorsiflexion  5  Ankle plantarflexion    Ankle inversion  5  Ankle eversion  5   (Blank rows = not tested)  FUNCTIONAL TESTS: Squat: shifts R SL Squat: with UE support WNL  1/2 kneel to stand: must use hand to push through thigh     TODAY'S TREATMENT:                                                                                                                              DATE:   12/24/21 Therex: Prone press ups 3x10  4/10 pain in left hip, thigh pain abolished Plank 3 reps 10 sec, 15  sec, 25 sec Quad alt leg 5 sec x 5 ea, then alt arm/leg x 5 ea Beast/Primal position 3 x 10 sec (pain down to 3-4/10 after above exercises) Supine Core - 4 part supine 10 sec x 10  Alt march up/up/down/down x 5 each side leading  Manual: L UPA and CPA mobs to lumbar abolishes burning in hip and centralizes pain to SIJ.   12/22/21:  Therapeutic exercises:  Standing trunk extension 2-3 sec x 3  Extension at counter 3 sec x 2 reps  Prone press up 2-3 sec x 10 sag at end range  Core - 4 part supine 10 sec x 10   Manual work: Spinal mobs Grade II/III lumbar PA  Soft  tissue mobilization Lt posterior hip   Trigger Point Dry-Needling  Treatment instructions: Expect mild to moderate muscle soreness. S/S of pneumothorax if dry needled over a lung field, and to seek immediate medical attention should they occur. Patient verbalized understanding of these instructions and education.  Patient Consent Given: Yes Education handout provided: Yes Muscles treated: Lt piriformis; gluts  Electrical stimulation performed: Yes Parameters: mAmp current to pt tolerance  Treatment response/outcome: decreased palpable tightness    Modalities:  Moist heat back/hp x 10 min   PATIENT EDUCATION:  Education details: PT eval findings, anticipated POC, and initial HEP  Person educated: Patient Education method: Explanation, Demonstration, Verbal cues, and Handouts Education comprehension: verbalized understanding and returned demonstration  HOME EXERCISE PROGRAM: Access Code: [email or web address] URL: [email or web address] Date: 12/24/2021 Prepared by: Almyra Free  Exercises - Prone Press Up  - 3-4 x daily - 7 x weekly - 1-3 sets - 10 reps - Lunge with Counter Support  - 1 x daily - 7 x weekly - 3 sets - 10 reps - Standing Lumbar Extension  - 2 x daily - 7 x weekly - 1 sets - 2-3 reps - 2-3 sec  hold - Standing Back Extension at [place]  - 2 x daily - 7 x weekly - 1 sets - 3 reps - 2-3 sec  hold - Standing Lumbar Extension with Counter  - 2 x daily - 7 x weekly - 1 sets - 3 reps - 2-3 sec  hold - Supine Transversus Abdominis Bracing with Pelvic Floor Contraction  - 2 x daily - 7 x weekly - 1 sets - 10 reps - 10sec  hold - Hooklying Sequential Leg March and Lower  - 1 x daily - 7 x weekly - 3 sets - 10 reps - Primal Push Up  - 1 x daily - 7 x weekly - 1 sets - 5 reps - max hold  ASSESSMENT:  CLINICAL IMPRESSION: Andrea Marquez is progressing well with centralization of pain. Good response to higher level TE today reducing pain to 2-3/10 by end of session. continues to demonstrate  potential for improvement and would benefit from continued skilled therapy to address impairments.     GOALS: Goals reviewed with patient? Yes  SHORT TERM GOALS: Target date: 12/29/2021 (Remove Blue Hyperlink)  Patient will be independent with initial HEP.  Baseline:  Goal status: INITIAL  2.  Patient will report centralization of radicular symptoms.  Baseline:  Goal status: INITIAL    LONG TERM GOALS: Target date: 01/26/2022  (Remove Blue Hyperlink)  Patient will be independent with advanced/ongoing HEP to improve outcomes and carryover.  Baseline:  Goal status: INITIAL  2.  Patient will report 75% improvement in low back pain to improve QOL.  Baseline:  Goal status: INITIAL  3.  Patient will be able to sit with equal WB and no increased sx.   Baseline:  Goal status: INITIAL  4.  Patient will demonstrate improved functional strength as demonstrated by ability to squat and perform floor to stand transfer on L LE without difficulty. Baseline:  Goal status: INITIAL  5.   Patient will tolerate 45-60 min of standing and walking to perform normal ADLS including cooking. Baseline:  Goal status: INITIAL  7.  Patient able to return to running without radicular sx into L LE.  Baseline:  Goal status: INITIAL  PLAN:  PT FREQUENCY: 2x/week  PT DURATION: 6 weeks  PLANNED INTERVENTIONS: Therapeutic exercises, Therapeutic activity, Neuromuscular re-education, Balance training, Gait training, Patient/Family education, Self Care, Joint mobilization, Aquatic Therapy, Dry Needling, Electrical stimulation, Spinal mobilization, Cryotherapy, Moist heat, Taping, Traction, Ultrasound, Ionotophoresis '4mg'$ /ml Dexamethasone, and Manual therapy.  PLAN FOR NEXT SESSION: Assess response to extension protocol, work on core strength, functional LE strength   Charlot Gouin, PT 12/24/2021, 1:06 PM

## 2021-12-24 ENCOUNTER — Ambulatory Visit: Payer: 59 | Admitting: Physical Therapy

## 2021-12-24 ENCOUNTER — Encounter: Payer: Self-pay | Admitting: Physical Therapy

## 2021-12-24 DIAGNOSIS — M5416 Radiculopathy, lumbar region: Secondary | ICD-10-CM

## 2021-12-24 DIAGNOSIS — R252 Cramp and spasm: Secondary | ICD-10-CM

## 2021-12-24 DIAGNOSIS — M6281 Muscle weakness (generalized): Secondary | ICD-10-CM

## 2021-12-29 ENCOUNTER — Ambulatory Visit: Payer: 59 | Attending: Family Medicine | Admitting: Rehabilitative and Restorative Service Providers"

## 2021-12-29 ENCOUNTER — Encounter: Payer: Self-pay | Admitting: Rehabilitative and Restorative Service Providers"

## 2021-12-29 DIAGNOSIS — M5416 Radiculopathy, lumbar region: Secondary | ICD-10-CM | POA: Diagnosis present

## 2021-12-29 DIAGNOSIS — M6281 Muscle weakness (generalized): Secondary | ICD-10-CM | POA: Diagnosis present

## 2021-12-29 DIAGNOSIS — R252 Cramp and spasm: Secondary | ICD-10-CM | POA: Insufficient documentation

## 2021-12-29 NOTE — Therapy (Addendum)
OUTPATIENT PHYSICAL THERAPY THORACOLUMBAR TREATMENT   Patient Name: Andrea Marquez MRN: [id number] DOB:03-Aug-1970, 51 y.o., female Today's Date: 12/29/2021  END OF SESSION:  PT End of Session - 12/29/21 0841     Visit Number 4    Date for PT Re-Evaluation 01/26/22    Authorization Type UHC    PT Start Time 0841    PT Stop Time 0930    PT Time Calculation (min) 49 min    Activity Tolerance Patient tolerated treatment well             Past Medical History:  Diagnosis Date   ADHD    Anxiety    Atrial tachycardia    Back pain    Depression    severe   Female infertility    GERD (gastroesophageal reflux disease)    Hyperlipidemia    Hypertension    Obesity    Palpitations    PONV (postoperative nausea and vomiting)    Prediabetes    Severe anxiety    Vertigo    Past Surgical History:  Procedure Laterality Date   AUGMENTATION MAMMAPLASTY     BREAST ENHANCEMENT SURGERY     LAPAROSCOPY     NSVD     x2   OOPHORECTOMY     Single    TOTAL VAGINAL HYSTERECTOMY     TUBAL LIGATION     Patient Active Problem List   Diagnosis Date Noted   Pain and swelling of left knee 07/23/2021   Encounter for weight management 06/20/2020   Acute stress reaction 04/16/2020   Vertigo 11/20/2019   Seasonal allergies 05/17/2019   Cold sore 04/05/2019   Atrial tachycardia 08/23/2018   Hyperhidrosis 07/18/2018   Stress incontinence 07/18/2018   Frequent urination 07/18/2018   Hematuria 10/24/2017   Attention deficit hyperactivity disorder (ADHD), combined type 57/26/2035   Synovial plica syndrome of right knee 04/29/2016   Severe episode of recurrent major depressive disorder, without psychotic features ([place]) 08/21/2015   Generalized anxiety disorder 08/21/2015   Hypertension 04/07/2015   Shingles 05/05/2012   Lumbar spondylosis 03/17/2012   Morbid obesity ([place]) 03/21/2008   INSOMNIA 06/20/2007   Depression with anxiety 04/19/2007    PCP: Hali Marry,  MD  REFERRING PROVIDER: [facility]  REFERRING DIAG: M54.16 (ICD-10-CM) - Radiculopathy, lumbar region  Rationale for Evaluation and Treatment: Rehabilitation  THERAPY DIAG:  Radiculopathy, lumbar region  Muscle weakness (generalized)  Cramp and spasm  ONSET DATE: March 2023  SUBJECTIVE:  SUBJECTIVE STATEMENT: Improving. No limp with walking this morning. Still some soreness in the Lt LB area and top of Lt posterior hip. The needling made me looser but I still have burning in the left hip and midway down thigh.  Eval: Patient reports left LE sciatica starting in March. Facet injections helped but epidural did not. She has not been able to run since pain started. Left leg is weak; stairs are getting better.   PERTINENT HISTORY:  Hysterectomy, anxiety, HTN  PAIN:  Are you having pain? Yes: NPRS scale: 0/10 Pain location: left low back hip to leg to back of the mid thigh Pain description: pressure low back; burn Aggravating factors: sitting, cooking and prolonged standing Relieving factors: meds a little, prone fig 4 sometimes  PRECAUTIONS: None   LIVING ENVIRONMENT: Lives with: lives with their family Lives in: House/apartment Stairs: Yes: Internal: 13 steps; can reach both and External: 8 steps; can reach both Has following equipment at home:  uses crawling if she has been active cleaning; must use rail and None  OCCUPATION: on the computer  PLOF: Independent  PATIENT GOALS: get back to running, normalizing ADLS  NEXT MD VISIT: none scheduled  OBJECTIVE:   DIAGNOSTIC FINDINGS:  MRI 04/14/21 - IMPRESSION: 1. Lumbar spine degeneration primarily affecting the facets and especially at L4-5 where there is anterolisthesis and active left facet arthritis. 2. Chronic and  noncompressive disc protrusions at T11-12 and L3-4.   MUSCLE LENGTH: HS: mild tighness bil Quads: ITB: Piriformis: mild tightness R Hip Flexors: WNL Heelcords:WNL   POSTURE: weight shift right and tight R QL  LUMBAR ROM:   AROM eval  Flexion full  Extension full  Right lateral flexion full  Left lateral flexion full  Right rotation full  Left rotation Full but mild tightness   (Blank rows = not tested)  LOWER EXTREMITY ROM:   WNL  LOWER EXTREMITY MMT:    MMT Right eval Left eval  Hip flexion 4+ 4+  Hip extension 5 5  Hip abduction 5 5  Hip adduction 5 5  Hip internal rotation    Hip external rotation    Knee flexion 5 5  Knee extension 5 5  Ankle dorsiflexion  5  Ankle plantarflexion    Ankle inversion  5  Ankle eversion  5   (Blank rows = not tested)  FUNCTIONAL TESTS: Squat: shifts R SL Squat: with UE support WNL  1/2 kneel to stand: must use hand to push through thigh     TODAY'S TREATMENT:                                                                                                                              DATE:   12/29/21:  Therex: Prone press ups 3x10  4/10 pain in left hip, thigh pain abolished Plank 3 reps 10 sec, 15 sec, 25 sec Supine Core - 4 part supine 10 sec x 10  Piriformis stretch  travell 30 sec x 3 bilat  Sit to stand hinged hip core tight x 5 Modified dead lift from 8" stool 10# KB x 10 hinged hip core tight  Row blue TB x 10 x 2 sets  Shoulder extension blue TB x 10 x 2 sets  Antirotation blue TB x 10 each side   Manual: L UPA and CPA mobs to sacrum and Lt lumbar with good release of tightness  Trigger Point Dry-Needling  Patient Consent Given: Yes Education handout provided: Yes Muscles treated: Lt piriformis; gluts  Electrical stimulation performed: no Parameters: n/a  Treatment response/outcome: decreased palpable tightness    Modalities:  Moist heat back/hp x 10 min   12/24/21 Therex: Prone press ups 3x10  4/10  pain in left hip, thigh pain abolished Plank 3 reps 10 sec, 15 sec, 25 sec Quad alt leg 5 sec x 5 ea, then alt arm/leg x 5 ea Beast/Primal position 3 x 10 sec (pain down to 3-4/10 after above exercises) Supine Core - 4 part supine 10 sec x 10  Alt march up/up/down/down x 5 each side leading  Manual: L UPA and CPA mobs to lumbar abolishes burning in hip and centralizes pain to SIJ.  PATIENT EDUCATION:  Education details: PT eval findings, anticipated POC, and initial HEP  Person educated: Patient Education method: Explanation, Demonstration, Verbal cues, and Handouts Education comprehension: verbalized understanding and returned demonstration  HOME EXERCISE PROGRAM: Access Code: [email or web address] URL: [email or web address] Date: 12/29/2021 Prepared by: Gillermo Murdoch  Exercises - Prone Press Up  - 3-4 x daily - 7 x weekly - 1-3 sets - 10 reps - Lunge with Counter Support  - 1 x daily - 7 x weekly - 3 sets - 10 reps - Standing Lumbar Extension  - 2 x daily - 7 x weekly - 1 sets - 2-3 reps - 2-3 sec  hold - Standing Back Extension at [place]  - 2 x daily - 7 x weekly - 1 sets - 3 reps - 2-3 sec  hold - Standing Lumbar Extension with Counter  - 2 x daily - 7 x weekly - 1 sets - 3 reps - 2-3 sec  hold - Supine Transversus Abdominis Bracing with Pelvic Floor Contraction  - 2 x daily - 7 x weekly - 1 sets - 10 reps - 10sec  hold - Hooklying Sequential Leg March and Lower  - 1 x daily - 7 x weekly - 3 sets - 10 reps - Primal Push Up  - 1 x daily - 7 x weekly - 1 sets - 5 reps - max hold - Supine Piriformis Stretch with Leg Straight  - 2 x daily - 7 x weekly - 1 sets - 3 reps - 30 sec  hold - Supine Pelvic Floor Stretch  - 2 x daily - 7 x weekly - Standing Row with Resistance with Anchored Resistance at Chest Height Palms Down  - 2 x daily - 7 x weekly - 1-3 sets - 10 reps - 3 sec  hold - Shoulder extension with resistance - Neutral  - 1 x daily - 7 x weekly - 1-2 sets - 10 reps - 3-5 sec   hold - Anti-Rotation Lateral Stepping with Press  - 2 x daily - 7 x weekly - 1-2 sets - 10 reps - 2-3 sec  hold - Sit to Stand  - 2 x daily - 7 x weekly - 1 sets - 10 reps - 3-5 sec  hold - Modified Deadlift  with Pelvic Contraction  - 1 x daily - 7 x weekly - 1 sets - 10 reps  ASSESSMENT:  CLINICAL IMPRESSION: Liz continues to progress with less pain in the Lt LE and increased functional activities for longer duration. Pain continues to centralize. Good response to DN and manual work through sacrum and Lt posterior hip. Added happy baby to address pelvic floor tightness(history of coccyx fx in past) . Added hinged hip sit to stand and modified dead lift as well as TB exercises for core strengthening.    GOALS: Goals reviewed with patient? Yes  SHORT TERM GOALS: Target date: 12/29/2021 (Remove Blue Hyperlink)  Patient will be independent with initial HEP.  Baseline:  Goal status: INITIAL  2.  Patient will report centralization of radicular symptoms.  Baseline:  Goal status: INITIAL    LONG TERM GOALS: Target date: 01/26/2022  (Remove Blue Hyperlink)  Patient will be independent with advanced/ongoing HEP to improve outcomes and carryover.  Baseline:  Goal status: INITIAL  2.  Patient will report 75% improvement in low back pain to improve QOL.  Baseline:  Goal status: INITIAL  3.  Patient will be able to sit with equal WB and no increased sx.   Baseline:  Goal status: INITIAL  4.  Patient will demonstrate improved functional strength as demonstrated by ability to squat and perform floor to stand transfer on L LE without difficulty. Baseline:  Goal status: INITIAL  5.   Patient will tolerate 45-60 min of standing and walking to perform normal ADLS including cooking. Baseline:  Goal status: INITIAL  7.  Patient able to return to running without radicular sx into L LE.  Baseline:  Goal status: INITIAL  PLAN:  PT FREQUENCY: 2x/week  PT DURATION: 6 weeks  PLANNED  INTERVENTIONS: Therapeutic exercises, Therapeutic activity, Neuromuscular re-education, Balance training, Gait training, Patient/Family education, Self Care, Joint mobilization, Aquatic Therapy, Dry Needling, Electrical stimulation, Spinal mobilization, Cryotherapy, Moist heat, Taping, Traction, Ultrasound, Ionotophoresis '4mg'$ /ml Dexamethasone, and Manual therapy.  PLAN FOR NEXT SESSION: Assess response to extension protocol, work on core strength, functional LE strength   [place], PT, MPH  12/29/2021, 10:03 AM

## 2021-12-30 NOTE — Therapy (Addendum)
OUTPATIENT PHYSICAL THERAPY THORACOLUMBAR TREATMENT   Patient Name: Andrea Marquez MRN: [id number] DOB:1970-02-10, 51 y.o., female Today's Date: 12/31/2021  END OF SESSION:  PT End of Session - 12/31/21 0758     Visit Number 5    Date for PT Re-Evaluation 01/26/22    Authorization Type [employer]    PT Start Time 0800    PT Stop Time 0843    PT Time Calculation (min) 43 min    Activity Tolerance Patient tolerated treatment well    Behavior During Therapy WFL for tasks assessed/performed              Past Medical History:  Diagnosis Date   ADHD    Anxiety    Atrial tachycardia    Back pain    Depression    severe   Female infertility    GERD (gastroesophageal reflux disease)    Hyperlipidemia    Hypertension    Obesity    Palpitations    PONV (postoperative nausea and vomiting)    Prediabetes    Severe anxiety    Vertigo    Past Surgical History:  Procedure Laterality Date   AUGMENTATION MAMMAPLASTY     BREAST ENHANCEMENT SURGERY     LAPAROSCOPY     NSVD     x2   OOPHORECTOMY     Single    TOTAL VAGINAL HYSTERECTOMY     TUBAL LIGATION     Patient Active Problem List   Diagnosis Date Noted   Pain and swelling of left knee 07/23/2021   Encounter for weight management 06/20/2020   Acute stress reaction 04/16/2020   Vertigo 11/20/2019   Seasonal allergies 05/17/2019   Cold sore 04/05/2019   Atrial tachycardia 08/23/2018   Hyperhidrosis 07/18/2018   Stress incontinence 07/18/2018   Frequent urination 07/18/2018   Hematuria 10/24/2017   Attention deficit hyperactivity disorder (ADHD), combined type 38/93/7342   Synovial plica syndrome of right knee 04/29/2016   Severe episode of recurrent major depressive disorder, without psychotic features ([place]) 08/21/2015   Generalized anxiety disorder 08/21/2015   Hypertension 04/07/2015   Shingles 05/05/2012   Lumbar spondylosis 03/17/2012   Morbid obesity ([place]) 03/21/2008   INSOMNIA 06/20/2007   Depression with  anxiety 04/19/2007    PCP: Hali Marry, MD  REFERRING PROVIDER: Thane Edu [facility]  REFERRING DIAG: M54.16 (ICD-10-CM) - Radiculopathy, lumbar region  Rationale for Evaluation and Treatment: Rehabilitation  THERAPY DIAG:  Radiculopathy, lumbar region  Muscle weakness (generalized)  Cramp and spasm  ONSET DATE: March 2023  SUBJECTIVE:  SUBJECTIVE STATEMENT: Low burning in left hip today. Definitely have more mobility in the hip. I don't want DN anymore.  Eval: Patient reports left LE sciatica starting in March. Facet injections helped but epidural did not. She has not been able to run since pain started. Left leg is weak; stairs are getting better.   PERTINENT HISTORY:  Hysterectomy, anxiety, HTN  PAIN:  Are you having pain? Yes: NPRS scale: 1-2/10 Pain location: left low back hip to leg to back of the mid thigh Pain description: pressure low back; burn Aggravating factors: sitting, cooking and prolonged standing Relieving factors: meds a little, prone fig 4 sometimes  PRECAUTIONS: None   LIVING ENVIRONMENT: Lives with: lives with their family Lives in: House/apartment Stairs: Yes: Internal: 13 steps; can reach both and External: 8 steps; can reach both Has following equipment at home:  uses crawling if she has been active cleaning; must use rail and None  OCCUPATION: on the computer  PLOF: Independent  PATIENT GOALS: get back to running, normalizing ADLS  NEXT MD VISIT: none scheduled  OBJECTIVE:   DIAGNOSTIC FINDINGS:  MRI 04/14/21 - IMPRESSION: 1. Lumbar spine degeneration primarily affecting the facets and especially at L4-5 where there is anterolisthesis and active left facet arthritis. 2. Chronic and noncompressive disc protrusions at T11-12 and  L3-4.   MUSCLE LENGTH: HS: mild tighness bil Quads: ITB: Piriformis: mild tightness R Hip Flexors: WNL Heelcords:WNL   POSTURE: weight shift right and tight R QL  LUMBAR ROM:   AROM eval  Flexion full  Extension full  Right lateral flexion full  Left lateral flexion full  Right rotation full  Left rotation Full but mild tightness   (Blank rows = not tested)  LOWER EXTREMITY ROM:   WNL  LOWER EXTREMITY MMT:    MMT Right eval Left eval  Hip flexion 4+ 4+  Hip extension 5 5  Hip abduction 5 5  Hip adduction 5 5  Hip internal rotation    Hip external rotation    Knee flexion 5 5  Knee extension 5 5  Ankle dorsiflexion  5  Ankle plantarflexion    Ankle inversion  5  Ankle eversion  5   (Blank rows = not tested)  FUNCTIONAL TESTS: Squat: shifts R SL Squat: with UE support WNL  1/2 kneel to stand: must use hand to push through thigh     TODAY'S TREATMENT:                                                                                                                              DATE:   .  03/03/21 Therex:  Prone press ups 3 x 10 abolishes pain in the left hip and thigh Planks x 3 max hold Sidelying hip ADDuction 2x10 Standing hip flexion step up onto 8 inch stool with BTB 2x10   Manual Therapy:  CPA and UPA Mobs to lumbar  IASTM to L peroneals and lateral gastroc  Assessed left foot - WNL    12/29/21:  Therex: Prone press ups 3x10  4/10 pain in left hip, thigh pain abolished Plank 3 reps 10 sec, 15 sec, 25 sec Supine Core - 4 part supine 10 sec x 10  Piriformis stretch travell 30 sec x 3 bilat  Sit to stand hinged hip core tight x 5 Modified dead lift from 8" stool 10# KB x 10 hinged hip core tight  Row blue TB x 10 x 2 sets  Shoulder extension blue TB x 10 x 2 sets  Antirotation blue TB x 10 each side   Manual: L UPA and CPA mobs to sacrum and Lt lumbar with good release of tightness  Trigger Point Dry-Needling  Patient Consent Given:  Yes Education handout provided: Yes Muscles treated: Lt piriformis; gluts  Electrical stimulation performed: no Parameters: n/a  Treatment response/outcome: decreased palpable tightness    Modalities:  Moist heat back/hp x 10 min   12/24/21 Therex: Prone press ups 3x10  4/10 pain in left hip, thigh pain abolished Plank 3 reps 10 sec, 15 sec, 25 sec Quad alt leg 5 sec x 5 ea, then alt arm/leg x 5 ea Beast/Primal position 3 x 10 sec (pain down to 3-4/10 after above exercises) Supine Core - 4 part supine 10 sec x 10  Alt march up/up/down/down x 5 each side leading  Manual: L UPA and CPA mobs to lumbar abolishes burning in hip and centralizes pain to SIJ.  PATIENT EDUCATION:  Education details: PT eval findings, anticipated POC, and initial HEP  Person educated: Patient Education method: Explanation, Demonstration, Verbal cues, and Handouts Education comprehension: verbalized understanding and returned demonstration  HOME EXERCISE PROGRAM: Access Code: [email or web address] URL: [email or web address] Date: 12/29/2021 Prepared by: Gillermo Murdoch  Exercises - Prone Press Up  - 3-4 x daily - 7 x weekly - 1-3 sets - 10 reps - Lunge with Counter Support  - 1 x daily - 7 x weekly - 3 sets - 10 reps - Standing Lumbar Extension  - 2 x daily - 7 x weekly - 1 sets - 2-3 reps - 2-3 sec  hold - Standing Back Extension at [place]  - 2 x daily - 7 x weekly - 1 sets - 3 reps - 2-3 sec  hold - Standing Lumbar Extension with Counter  - 2 x daily - 7 x weekly - 1 sets - 3 reps - 2-3 sec  hold - Supine Transversus Abdominis Bracing with Pelvic Floor Contraction  - 2 x daily - 7 x weekly - 1 sets - 10 reps - 10sec  hold - Hooklying Sequential Leg March and Lower  - 1 x daily - 7 x weekly - 3 sets - 10 reps - Primal Push Up  - 1 x daily - 7 x weekly - 1 sets - 5 reps - max hold - Supine Piriformis Stretch with Leg Straight  - 2 x daily - 7 x weekly - 1 sets - 3 reps - 30 sec  hold - Supine Pelvic Floor  Stretch  - 2 x daily - 7 x weekly - Standing Row with Resistance with Anchored Resistance at Chest Height Palms Down  - 2 x daily - 7 x weekly - 1-3 sets - 10 reps - 3 sec  hold - Shoulder extension with resistance - Neutral  - 1 x daily - 7 x weekly - 1-2 sets - 10 reps - 3-5 sec  hold - Anti-Rotation Lateral Stepping with Press  -  2 x daily - 7 x weekly - 1-2 sets - 10 reps - 2-3 sec  hold - Sit to Stand  - 2 x daily - 7 x weekly - 1 sets - 10 reps - 3-5 sec  hold - Modified Deadlift with Pelvic Contraction  - 1 x daily - 7 x weekly - 1 sets - 10 reps  ASSESSMENT:  CLINICAL IMPRESSION: [place]ara continues to be able to centralize pain with press ups. Increased sx into lateral foot reported. Good response to IASTM to peroneals abolishing sx. Patient with weakness in left hip flexor and ADDuctors so added to HEP.    GOALS: Goals reviewed with patient? Yes  SHORT TERM GOALS: Target date: 12/29/2021 (Remove Blue Hyperlink)  Patient will be independent with initial HEP.  Baseline:  Goal status: INITIAL  2.  Patient will report centralization of radicular symptoms.  Baseline:  Goal status: INITIAL    LONG TERM GOALS: Target date: 01/26/2022  (Remove Blue Hyperlink)  Patient will be independent with advanced/ongoing HEP to improve outcomes and carryover.  Baseline:  Goal status: INITIAL  2.  Patient will report 75% improvement in low back pain to improve QOL.  Baseline:  Goal status: INITIAL  3.  Patient will be able to sit with equal WB and no increased sx.   Baseline:  Goal status: INITIAL  4.  Patient will demonstrate improved functional strength as demonstrated by ability to squat and perform floor to stand transfer on L LE without difficulty. Baseline:  Goal status: INITIAL  5.   Patient will tolerate 45-60 min of standing and walking to perform normal ADLS including cooking. Baseline:  Goal status: INITIAL  7.  Patient able to return to running without radicular sx into L  LE.  Baseline:  Goal status: INITIAL  PLAN:  PT FREQUENCY: 2x/week  PT DURATION: 6 weeks  PLANNED INTERVENTIONS: Therapeutic exercises, Therapeutic activity, Neuromuscular re-education, Balance training, Gait training, Patient/Family education, Self Care, Joint mobilization, Aquatic Therapy, Dry Needling, Electrical stimulation, Spinal mobilization, Cryotherapy, Moist heat, Taping, Traction, Ultrasound, Ionotophoresis '4mg'$ /ml Dexamethasone, and Manual therapy.  PLAN FOR NEXT SESSION: Assess response to extension protocol, work on core strength, functional LE strength   Daire Okimoto, PT 12/31/2021, 8:48 AM

## 2021-12-31 ENCOUNTER — Ambulatory Visit: Payer: 59 | Admitting: Physical Therapy

## 2021-12-31 ENCOUNTER — Encounter: Payer: Self-pay | Admitting: Physical Therapy

## 2021-12-31 DIAGNOSIS — M5416 Radiculopathy, lumbar region: Secondary | ICD-10-CM | POA: Diagnosis not present

## 2021-12-31 DIAGNOSIS — R252 Cramp and spasm: Secondary | ICD-10-CM

## 2021-12-31 DIAGNOSIS — M6281 Muscle weakness (generalized): Secondary | ICD-10-CM

## 2022-01-04 ENCOUNTER — Other Ambulatory Visit: Payer: Self-pay | Admitting: *Deleted

## 2022-01-04 MED ORDER — AMPHETAMINE-DEXTROAMPHET ER 30 MG PO CP24
30.0000 mg | ORAL_CAPSULE | ORAL | 0 refills | Status: DC
Start: 2022-01-04 — End: 2022-02-16

## 2022-01-04 NOTE — Telephone Encounter (Signed)
Pt states that here medication was sent to [facility]. She doesn't use this pharmacy. She would like for this to be sent to [facility] in [place]

## 2022-01-06 NOTE — Therapy (Signed)
OUTPATIENT PHYSICAL THERAPY THORACOLUMBAR TREATMENT   Patient Name: Andrea Marquez MRN: [id number] DOB:04/11/1970, 51 y.o., female Today's Date: 01/07/2022  END OF SESSION:  PT End of Session - 01/07/22 1104     Visit Number 6    Date for PT Re-Evaluation 01/26/22    Authorization Type UHC    PT Start Time 1104    PT Stop Time 1145    PT Time Calculation (min) 41 min    Activity Tolerance Patient tolerated treatment well    Behavior During Therapy WFL for tasks assessed/performed               Past Medical History:  Diagnosis Date   ADHD    Anxiety    Atrial tachycardia    Back pain    Depression    severe   Female infertility    GERD (gastroesophageal reflux disease)    Hyperlipidemia    Hypertension    Obesity    Palpitations    PONV (postoperative nausea and vomiting)    Prediabetes    Severe anxiety    Vertigo    Past Surgical History:  Procedure Laterality Date   AUGMENTATION MAMMAPLASTY     BREAST ENHANCEMENT SURGERY     LAPAROSCOPY     NSVD     x2   OOPHORECTOMY     Single    TOTAL VAGINAL HYSTERECTOMY     TUBAL LIGATION     Patient Active Problem List   Diagnosis Date Noted   Pain and swelling of left knee 07/23/2021   Encounter for weight management 06/20/2020   Acute stress reaction 04/16/2020   Vertigo 11/20/2019   Seasonal allergies 05/17/2019   Cold sore 04/05/2019   Atrial tachycardia 08/23/2018   Hyperhidrosis 07/18/2018   Stress incontinence 07/18/2018   Frequent urination 07/18/2018   Hematuria 10/24/2017   Attention deficit hyperactivity disorder (ADHD), combined type 10/07/2017   Synovial plica syndrome of right knee 04/29/2016   Severe episode of recurrent major depressive disorder, without psychotic features ([facility]) 08/21/2015   Generalized anxiety disorder 08/21/2015   Hypertension 04/07/2015   Shingles 05/05/2012   Lumbar spondylosis 03/17/2012   Morbid obesity ([facility]) 03/21/2008   INSOMNIA 06/20/2007   Depression  with anxiety 04/19/2007    PCP: Metheney, Catherine D, MD  REFERRING PROVIDER: Sarah Ferebee Desai PAC  REFERRING DIAG: M54.16 (ICD-10-CM) - Radiculopathy, lumbar region  Rationale for Evaluation and Treatment: Rehabilitation  THERAPY DIAG:  Radiculopathy, lumbar region  Muscle weakness (generalized)  Cramp and spasm  ONSET DATE: March 2023  SUBJECTIVE:                                                                                                                                                                                             SUBJECTIVE STATEMENT: The pain has centralized to the low back and is now constant.  Eval: Patient reports left LE sciatica starting in March. Facet injections helped but epidural did not. She has not been able to run since pain started. Left leg is weak; stairs are getting better.   PERTINENT HISTORY:  Hysterectomy, anxiety, HTN  PAIN:  Are you having pain? Yes: NPRS scale: 6/10 Pain location: left low back hip to leg to back of the mid thigh Pain description: pressure low back; burn Aggravating factors: sitting, cooking and prolonged standing Relieving factors: meds a little, prone fig 4 sometimes  PRECAUTIONS: None   LIVING ENVIRONMENT: Lives with: lives with their family Lives in: House/apartment Stairs: Yes: Internal: 13 steps; can reach both and External: 8 steps; can reach both Has following equipment at home:  uses crawling if she has been active cleaning; must use rail and None  OCCUPATION: on the computer  PLOF: Independent  PATIENT GOALS: get back to running, normalizing ADLS  NEXT MD VISIT: none scheduled  OBJECTIVE:   DIAGNOSTIC FINDINGS:  MRI 04/14/21 - IMPRESSION: 1. Lumbar spine degeneration primarily affecting the facets and especially at L4-5 where there is anterolisthesis and active left facet arthritis. 2. Chronic and noncompressive disc protrusions at T11-12 and L3-4.   MUSCLE LENGTH: HS: mild  tighness bil Quads: ITB: Piriformis: mild tightness R Hip Flexors: WNL Heelcords:WNL   POSTURE: weight shift right and tight R QL  LUMBAR ROM:   AROM eval  Flexion full  Extension full  Right lateral flexion full  Left lateral flexion full  Right rotation full  Left rotation Full but mild tightness   (Blank rows = not tested)  LOWER EXTREMITY ROM:   WNL  LOWER EXTREMITY MMT:    MMT Right eval Left eval  Hip flexion 4+ 4+  Hip extension 5 5  Hip abduction 5 5  Hip adduction 5 5  Hip internal rotation    Hip external rotation    Knee flexion 5 5  Knee extension 5 5  Ankle dorsiflexion  5  Ankle plantarflexion    Ankle inversion  5  Ankle eversion  5   (Blank rows = not tested)  FUNCTIONAL TESTS: Squat: shifts R SL Squat: with UE support WNL  1/2 kneel to stand: must use hand to push through thigh     TODAY'S TREATMENT:                                                                                                                              DATE:   .  01/07/22 Prone press ups 3x 10, last set with exhale at top; pain went from 6 to 3/10. Quadriped alt leg x 3 ea, then arm and leg x 5 ea Worked on finding good QL/lumbar stretch: best was flexion at sink with traction and then L and R sides Sit to stand hinged hip core tight x 5, then 10 with 10#  Modified dead lift from 8" stool 20# KB x 5 hinged hip core tight, then without stool x 10 Shoulder extension 15# x 20 for core Open Book x 5 ea  Manual  CPA and UPA Mobs to lumbar and lower thoracic STM to bil lumbar and lower throacic paraspinals TPR to Right QL     12/31/21 Therex:  Prone press ups 3 x 10 abolishes pain in the left hip and thigh Planks x 3 max hold Sidelying hip ADDuction 2x10 Standing hip flexion step up onto 8 inch stool with BTB 2x10   Manual Therapy:  CPA and UPA Mobs to lumbar  IASTM to L peroneals and lateral gastroc Assessed left foot - WNL    PATIENT EDUCATION:   Education details: PT eval findings, anticipated POC, and initial HEP  Person educated: Patient Education method: Explanation, Demonstration, Verbal cues, and Handouts Education comprehension: verbalized understanding and returned demonstration  HOME EXERCISE PROGRAM: Access Code: [email or web address] URL: [email or web address] Date: 12/29/2021 Prepared by: Celyn Holt  Exercises - Prone Press Up  - 3-4 x daily - 7 x weekly - 1-3 sets - 10 reps - Lunge with Counter Support  - 1 x daily - 7 x weekly - 3 sets - 10 reps - Standing Lumbar Extension  - 2 x daily - 7 x weekly - 1 sets - 2-3 reps - 2-3 sec  hold - Standing Back Extension at Wall  - 2 x daily - 7 x weekly - 1 sets - 3 reps - 2-3 sec  hold - Standing Lumbar Extension with Counter  - 2 x daily - 7 x weekly - 1 sets - 3 reps - 2-3 sec  hold - Supine Transversus Abdominis Bracing with Pelvic Floor Contraction  - 2 x daily - 7 x weekly - 1 sets - 10 reps - 10sec  hold - Hooklying Sequential Leg March and Lower  - 1 x daily - 7 x weekly - 3 sets - 10 reps - Primal Push Up  - 1 x daily - 7 x weekly - 1 sets - 5 reps - max hold - Supine Piriformis Stretch with Leg Straight  - 2 x daily - 7 x weekly - 1 sets - 3 reps - 30 sec  hold - Supine Pelvic Floor Stretch  - 2 x daily - 7 x weekly - Standing Row with Resistance with Anchored Resistance at Chest Height Palms Down  - 2 x daily - 7 x weekly - 1-3 sets - 10 reps - 3 sec  hold - Shoulder extension with resistance - Neutral  - 1 x daily - 7 x weekly - 1-2 sets - 10 reps - 3-5 sec  hold - Anti-Rotation Lateral Stepping with Press  - 2 x daily - 7 x weekly - 1-2 sets - 10 reps - 2-3 sec  hold - Sit to Stand  - 2 x daily - 7 x weekly - 1 sets - 10 reps - 3-5 sec  hold - Modified Deadlift with Pelvic Contraction  - 1 x daily - 7 x weekly - 1 sets - 10 reps  ASSESSMENT:  CLINICAL IMPRESSION: Yosselin continues to be able to centralize pain and extensions with exhale decreased pain in low  back. Increased pain in R upper lumbar and lower thoracic paraspinals which responded well to manual therapy. She reports lateral leg and foot pain has resolved.    GOALS: Goals reviewed with patient? Yes  SHORT TERM GOALS: Target date: 12/29/2021 (Remove Blue Hyperlink)    Patient will be independent with initial HEP.  Baseline:  Goal status: MET  2.  Patient will report centralization of radicular symptoms.  Baseline:  Goal status: MET    LONG TERM GOALS: Target date: 01/26/2022  (Remove Blue Hyperlink)  Patient will be independent with advanced/ongoing HEP to improve outcomes and carryover.  Baseline:  Goal status: INITIAL  2.  Patient will report 75% improvement in low back pain to improve QOL.  Baseline:  Goal status: INITIAL  3.  Patient will be able to sit with equal WB and no increased sx.   Baseline:  Goal status: INITIAL  4.  Patient will demonstrate improved functional strength as demonstrated by ability to squat and perform floor to stand transfer on L LE without difficulty. Baseline:  Goal status: INITIAL  5.   Patient will tolerate 45-60 min of standing and walking to perform normal ADLS including cooking. Baseline:  Goal status: INITIAL  7.  Patient able to return to running without radicular sx into L LE.  Baseline:  Goal status: INITIAL  PLAN:  PT FREQUENCY: 2x/week  PT DURATION: 6 weeks  PLANNED INTERVENTIONS: Therapeutic exercises, Therapeutic activity, Neuromuscular re-education, Balance training, Gait training, Patient/Family education, Self Care, Joint mobilization, Aquatic Therapy, Dry Needling, Electrical stimulation, Spinal mobilization, Cryotherapy, Moist heat, Taping, Traction, Ultrasound, Ionotophoresis 4mg/ml Dexamethasone, and Manual therapy.  PLAN FOR NEXT SESSION: check LTG status, Assess response to extension protocol, work on core strength, functional LE strength   RIDDLES,JULIE, PT 01/07/2022, 1:17 PM  

## 2022-01-07 ENCOUNTER — Encounter: Payer: Self-pay | Admitting: Physical Therapy

## 2022-01-07 ENCOUNTER — Ambulatory Visit: Payer: 59 | Admitting: Physical Therapy

## 2022-01-07 DIAGNOSIS — M6281 Muscle weakness (generalized): Secondary | ICD-10-CM

## 2022-01-07 DIAGNOSIS — M5416 Radiculopathy, lumbar region: Secondary | ICD-10-CM | POA: Diagnosis not present

## 2022-01-07 DIAGNOSIS — R252 Cramp and spasm: Secondary | ICD-10-CM

## 2022-01-12 ENCOUNTER — Ambulatory Visit: Payer: 59 | Admitting: Rehabilitative and Restorative Service Providers"

## 2022-01-12 ENCOUNTER — Encounter: Payer: Self-pay | Admitting: Rehabilitative and Restorative Service Providers"

## 2022-01-12 DIAGNOSIS — M5416 Radiculopathy, lumbar region: Secondary | ICD-10-CM | POA: Diagnosis not present

## 2022-01-12 DIAGNOSIS — R252 Cramp and spasm: Secondary | ICD-10-CM

## 2022-01-12 DIAGNOSIS — M6281 Muscle weakness (generalized): Secondary | ICD-10-CM

## 2022-01-12 NOTE — Therapy (Signed)
OUTPATIENT PHYSICAL THERAPY THORACOLUMBAR TREATMENT   Patient Name: Andrea Marquez MRN: [id number] DOB:01/25/1971, 51 y.o., female Today's Date: 01/12/2022  END OF SESSION:  PT End of Session - 01/12/22 0807     Visit Number 7    Date for PT Re-Evaluation 01/26/22    Authorization Type [employer]    PT Start Time 0802    PT Stop Time 0845    PT Time Calculation (min) 43 min    Activity Tolerance Patient tolerated treatment well               Past Medical History:  Diagnosis Date   ADHD    Anxiety    Atrial tachycardia    Back pain    Depression    severe   Female infertility    GERD (gastroesophageal reflux disease)    Hyperlipidemia    Hypertension    Obesity    Palpitations    PONV (postoperative nausea and vomiting)    Prediabetes    Severe anxiety    Vertigo    Past Surgical History:  Procedure Laterality Date   AUGMENTATION MAMMAPLASTY     BREAST ENHANCEMENT SURGERY     LAPAROSCOPY     NSVD     x2   OOPHORECTOMY     Single    TOTAL VAGINAL HYSTERECTOMY     TUBAL LIGATION     Patient Active Problem List   Diagnosis Date Noted   Pain and swelling of left knee 07/23/2021   Encounter for weight management 06/20/2020   Acute stress reaction 04/16/2020   Vertigo 11/20/2019   Seasonal allergies 05/17/2019   Cold sore 04/05/2019   Atrial tachycardia 08/23/2018   Hyperhidrosis 07/18/2018   Stress incontinence 07/18/2018   Frequent urination 07/18/2018   Hematuria 10/24/2017   Attention deficit hyperactivity disorder (ADHD), combined type 61/51/8343   Synovial plica syndrome of right knee 04/29/2016   Severe episode of recurrent major depressive disorder, without psychotic features ([place]) 08/21/2015   Generalized anxiety disorder 08/21/2015   Hypertension 04/07/2015   Shingles 05/05/2012   Lumbar spondylosis 03/17/2012   Morbid obesity ([place]) 03/21/2008   INSOMNIA 06/20/2007   Depression with anxiety 04/19/2007    PCP: Hali Marry,  MD  REFERRING PROVIDER: Thane Edu [facility]  REFERRING DIAG: M54.16 (ICD-10-CM) - Radiculopathy, lumbar region  Rationale for Evaluation and Treatment: Rehabilitation  THERAPY DIAG:  Radiculopathy, lumbar region  Muscle weakness (generalized)  Cramp and spasm  ONSET DATE: March 2023  SUBJECTIVE:  SUBJECTIVE STATEMENT: Continued tightness in the lower back, Lt and Rt. Working on exercises at home. Can really feel the spot in the Lt back/buttock with stretch. The pain has centralized to the low back and is now constant.  Eval: Patient reports left LE sciatica starting in March. Facet injections helped but epidural did not. She has not been able to run since pain started. Left leg is weak; stairs are getting better.   PERTINENT HISTORY:  Hysterectomy, anxiety, HTN  PAIN:  Are you having pain? Yes: NPRS scale: 5/10 Pain location: left low back hip to leg to back of the mid thigh Pain description: pressure low back; burn Aggravating factors: sitting, cooking and prolonged standing Relieving factors: meds a little, prone fig 4 sometimes  PRECAUTIONS: None   LIVING ENVIRONMENT: Lives with: lives with their family Lives in: House/apartment Stairs: Yes: Internal: 13 steps; can reach both and External: 8 steps; can reach both Has following equipment at home:  uses crawling if she has been active cleaning; must use rail and None  OCCUPATION: on the computer  PLOF: Independent  PATIENT GOALS: get back to running, normalizing ADLS  NEXT MD VISIT: none scheduled  OBJECTIVE:   DIAGNOSTIC FINDINGS:  MRI 04/14/21 - IMPRESSION: 1. Lumbar spine degeneration primarily affecting the facets and especially at L4-5 where there is anterolisthesis and active left facet arthritis. 2. Chronic and  noncompressive disc protrusions at T11-12 and L3-4.   MUSCLE LENGTH: HS: mild tighness bil Quads: ITB: Piriformis: mild tightness R Hip Flexors: WNL Heelcords:WNL   POSTURE: weight shift right and tight R QL  LUMBAR ROM:   AROM eval  Flexion full  Extension full  Right lateral flexion full  Left lateral flexion full  Right rotation full  Left rotation Full but mild tightness   (Blank rows = not tested)  LOWER EXTREMITY ROM:   WNL  LOWER EXTREMITY MMT:    MMT Right eval Left eval  Hip flexion 4+ 4+  Hip extension 5 5  Hip abduction 5 5  Hip adduction 5 5  Hip internal rotation    Hip external rotation    Knee flexion 5 5  Knee extension 5 5  Ankle dorsiflexion  5  Ankle plantarflexion    Ankle inversion  5  Ankle eversion  5   (Blank rows = not tested)  FUNCTIONAL TESTS: Squat: shifts R SL Squat: with UE support WNL  1/2 kneel to stand: must use hand to push through thigh     TODAY'S TREATMENT:                                                                                                                              DATE:   01/12/22 Prone press ups 2 x 10, last set w/ exhale at top Pelvic press 5 sec x 10  Piriformis stretch supine 30 sec x 3 Rt/Lt; diagonal knee to chest 30 sec x 2 Rt/Lt  Quadriped alt  leg x 3 ea, then arm and leg x 5 ea Worked on finding good QL/lumbar stretch: best was flexion at sink with traction and then L/R sides(from last visit not as successful today) Walking laps around gym between manual work and stretches    Myofacial ball release work posterior Lt posterior hip/piriformis and QL standing   Statistician and UPA Mobs to lumbar and lower thoracic STM to bil lumbar and lower throacic paraspinals TPR to Right QL  Modalities:  Moist heat x 10 min    01/07/22 Prone press ups 3x 10, last set with exhale at top; pain went from 6 to 3/10. Quadriped alt leg x 3 ea, then arm and leg x 5 ea Worked on finding good QL/lumbar  stretch: best was flexion at sink with traction and then L and R sides Sit to stand hinged hip core tight x 5, then 10 with 10#  Modified dead lift from 8" stool 20# KB x 5 hinged hip core tight, then without stool x 10 Shoulder extension 15# x 20 for core Open Book x 5 ea Manual  CPA and UPA Mobs to lumbar and lower thoracic STM to bil lumbar and lower throacic paraspinals TPR to Right QL    PATIENT EDUCATION:  Education details: PT eval findings, anticipated POC, and initial HEP  Person educated: Patient Education method: Explanation, Demonstration, Verbal cues, and Handouts Education comprehension: verbalized understanding and returned demonstration  HOME EXERCISE PROGRAM: Access Code: [email or web address] URL: [email or web address] Date: 12/29/2021 Prepared by: Gillermo Murdoch  Exercises - Prone Press Up  - 3-4 x daily - 7 x weekly - 1-3 sets - 10 reps - Lunge with Counter Support  - 1 x daily - 7 x weekly - 3 sets - 10 reps - Standing Lumbar Extension  - 2 x daily - 7 x weekly - 1 sets - 2-3 reps - 2-3 sec  hold - Standing Back Extension at [place]  - 2 x daily - 7 x weekly - 1 sets - 3 reps - 2-3 sec  hold - Standing Lumbar Extension with Counter  - 2 x daily - 7 x weekly - 1 sets - 3 reps - 2-3 sec  hold - Supine Transversus Abdominis Bracing with Pelvic Floor Contraction  - 2 x daily - 7 x weekly - 1 sets - 10 reps - 10sec  hold - Hooklying Sequential Leg March and Lower  - 1 x daily - 7 x weekly - 3 sets - 10 reps - Primal Push Up  - 1 x daily - 7 x weekly - 1 sets - 5 reps - max hold - Supine Piriformis Stretch with Leg Straight  - 2 x daily - 7 x weekly - 1 sets - 3 reps - 30 sec  hold - Supine Pelvic Floor Stretch  - 2 x daily - 7 x weekly - Standing Row with Resistance with Anchored Resistance at Chest Height Palms Down  - 2 x daily - 7 x weekly - 1-3 sets - 10 reps - 3 sec  hold - Shoulder extension with resistance - Neutral  - 1 x daily - 7 x weekly - 1-2 sets - 10 reps -  3-5 sec  hold - Anti-Rotation Lateral Stepping with Press  - 2 x daily - 7 x weekly - 1-2 sets - 10 reps - 2-3 sec  hold - Sit to Stand  - 2 x daily - 7 x weekly - 1 sets - 10 reps - 3-5  sec  hold - Modified Deadlift with Pelvic Contraction  - 1 x daily - 7 x weekly - 1 sets - 10 reps  ASSESSMENT:  CLINICAL IMPRESSION: Janera continues to report and demonstrate increased tightness in Lt > Rt hips including piriformis and gluts into lumbar spine. Symptoms continue to be centralized. Extension decreases back pain but she continues to have tightness in the lumbar spine and posterior hips from sacral border to greater trochanter. Working today to stretch tight hip rotators and lumbar spine She reports lateral leg and foot pain has resolved.    GOALS: Goals reviewed with patient? Yes  SHORT TERM GOALS: Target date: 12/29/2021 (Remove Blue Hyperlink)  Patient will be independent with initial HEP.  Baseline:  Goal status: MET  2.  Patient will report centralization of radicular symptoms.  Baseline:  Goal status: MET    LONG TERM GOALS: Target date: 01/26/2022  (Remove Blue Hyperlink)  Patient will be independent with advanced/ongoing HEP to improve outcomes and carryover.  Baseline:  Goal status: INITIAL  2.  Patient will report 75% improvement in low back pain to improve QOL.  Baseline:  Goal status: INITIAL  3.  Patient will be able to sit with equal WB and no increased sx.   Baseline:  Goal status: INITIAL  4.  Patient will demonstrate improved functional strength as demonstrated by ability to squat and perform floor to stand transfer on L LE without difficulty. Baseline:  Goal status: INITIAL  5.   Patient will tolerate 45-60 min of standing and walking to perform normal ADLS including cooking. Baseline:  Goal status: INITIAL  7.  Patient able to return to running without radicular sx into L LE.  Baseline:  Goal status: INITIAL  PLAN:  PT FREQUENCY: 2x/week  PT  DURATION: 6 weeks  PLANNED INTERVENTIONS: Therapeutic exercises, Therapeutic activity, Neuromuscular re-education, Balance training, Gait training, Patient/Family education, Self Care, Joint mobilization, Aquatic Therapy, Dry Needling, Electrical stimulation, Spinal mobilization, Cryotherapy, Moist heat, Taping, Traction, Ultrasound, Ionotophoresis 52m/ml Dexamethasone, and Manual therapy.  PLAN FOR NEXT SESSION: check LTG status, Assess response to extension protocol, work on core strength, functional LE strength   Karlin Heilman PNilda Simmer PT, MPH  01/12/2022, 8:08 AM

## 2022-01-19 ENCOUNTER — Ambulatory Visit: Payer: 59 | Admitting: Physical Therapy

## 2022-01-19 ENCOUNTER — Encounter: Payer: Self-pay | Admitting: Physical Therapy

## 2022-01-19 DIAGNOSIS — M6281 Muscle weakness (generalized): Secondary | ICD-10-CM

## 2022-01-19 DIAGNOSIS — R252 Cramp and spasm: Secondary | ICD-10-CM

## 2022-01-19 DIAGNOSIS — M5416 Radiculopathy, lumbar region: Secondary | ICD-10-CM | POA: Diagnosis not present

## 2022-01-19 NOTE — Therapy (Signed)
OUTPATIENT PHYSICAL THERAPY THORACOLUMBAR TREATMENT   Patient Name: Andrea Marquez MRN: [id number] DOB:[birth year], 51 y.o., female Today's Date: 01/19/2022  END OF SESSION:  PT End of Session - 01/19/22 0928     Visit Number 8    Date for PT Re-Evaluation 01/26/22    Authorization Type UHC    PT Start Time 0930    PT Stop Time 0354    PT Time Calculation (min) 45 min    Activity Tolerance Patient tolerated treatment well    Behavior During Therapy WFL for tasks assessed/performed                Past Medical History:  Diagnosis Date   ADHD    Anxiety    Atrial tachycardia    Back pain    Depression    severe   Female infertility    GERD (gastroesophageal reflux disease)    Hyperlipidemia    Hypertension    Obesity    Palpitations    PONV (postoperative nausea and vomiting)    Prediabetes    Severe anxiety    Vertigo    Past Surgical History:  Procedure Laterality Date   AUGMENTATION MAMMAPLASTY     BREAST ENHANCEMENT SURGERY     LAPAROSCOPY     NSVD     x2   OOPHORECTOMY     Single    TOTAL VAGINAL HYSTERECTOMY     TUBAL LIGATION     Patient Active Problem List   Diagnosis Date Noted   Pain and swelling of left knee 07/23/2021   Encounter for weight management 06/20/2020   Acute stress reaction 04/16/2020   Vertigo 11/20/2019   Seasonal allergies 05/17/2019   Cold sore 04/05/2019   Atrial tachycardia 08/23/2018   Hyperhidrosis 07/18/2018   Stress incontinence 07/18/2018   Frequent urination 07/18/2018   Hematuria 10/24/2017   Attention deficit hyperactivity disorder (ADHD), combined type 65/68/1275   Synovial plica syndrome of right knee 04/29/2016   Severe episode of recurrent major depressive disorder, without psychotic features ([place]) 08/21/2015   Generalized anxiety disorder 08/21/2015   Hypertension 04/07/2015   Shingles 05/05/2012   Lumbar spondylosis 03/17/2012   Morbid obesity ([place]) 03/21/2008   INSOMNIA 06/20/2007   Depression  with anxiety 04/19/2007    PCP: Hali Marry, MD  REFERRING PROVIDER: Thane Edu [facility]  REFERRING DIAG: M54.16 (ICD-10-CM) - Radiculopathy, lumbar region  Rationale for Evaluation and Treatment: Rehabilitation  THERAPY DIAG:  Radiculopathy, lumbar region  Muscle weakness (generalized)  Cramp and spasm  ONSET DATE: March 2023  SUBJECTIVE:  SUBJECTIVE STATEMENT: Pt reports feeling really tight this morning. Request to stretch first more. Pt states she has not been able to do all of her exercises.   Eval: Patient reports left LE sciatica starting in March. Facet injections helped but epidural did not. She has not been able to run since pain started. Left leg is weak; stairs are getting better.   PERTINENT HISTORY:  Hysterectomy, anxiety, HTN  PAIN:  Are you having pain? Yes: NPRS scale: 5/10 Pain location: left low back hip to leg to back of the mid thigh Pain description: pressure low back; burn Aggravating factors: sitting, cooking and prolonged standing Relieving factors: meds a little, prone fig 4 sometimes  PRECAUTIONS: None   LIVING ENVIRONMENT: Lives with: lives with their family Lives in: House/apartment Stairs: Yes: Internal: 13 steps; can reach both and External: 8 steps; can reach both Has following equipment at home:  uses crawling if she has been active cleaning; must use rail and None  OCCUPATION: on the computer  PLOF: Independent  PATIENT GOALS: get back to running, normalizing ADLS  NEXT MD VISIT: none scheduled  OBJECTIVE:   DIAGNOSTIC FINDINGS:  MRI 04/14/21 - IMPRESSION: 1. Lumbar spine degeneration primarily affecting the facets and especially at L4-5 where there is anterolisthesis and active left facet arthritis. 2. Chronic and  noncompressive disc protrusions at T11-12 and L3-4.   MUSCLE LENGTH: HS: mild tighness bil Quads: ITB: Piriformis: mild tightness R Hip Flexors: WNL Heelcords:WNL   POSTURE: weight shift right and tight R QL  LUMBAR ROM:   AROM eval  Flexion full  Extension full  Right lateral flexion full  Left lateral flexion full  Right rotation full  Left rotation Full but mild tightness   (Blank rows = not tested)  LOWER EXTREMITY MMT:    MMT Right eval Left eval  Hip flexion 4+ 4+  Hip extension 5 5  Hip abduction 5 5  Hip adduction 5 5  Hip internal rotation    Hip external rotation    Knee flexion 5 5  Knee extension 5 5  Ankle dorsiflexion  5  Ankle plantarflexion    Ankle inversion  5  Ankle eversion  5   (Blank rows = not tested)  FUNCTIONAL TESTS: Squat: shifts R SL Squat: with UE support WNL  1/2 kneel to stand: must use hand to push through thigh     TODAY'S TREATMENT:                                                                                                                              DATE:   01/19/22 THEREX Sitting Hamstring stretch 2x 30 sec bilat Piriformis stretch 2x 30 sec bilat Pball flexion and then lateral flexion 2x 30 sec each bilat Hip flexor stretch 2x30 sec with arms outstretched bilat Prone  press ups 2 x 10, last set w/ exhale at top Hip ext 2 x10 Supine  PPT 2x10  PPT +  bridge 2x10 Standing  Modified deadlift 20# KB 2x10  MANUAL  CPA and UPA Mobs to lumbar and lower thoracic STM to bil lumbar and lower throacic paraspinals TPR to Right QL   01/12/22 Prone press ups 2 x 10, last set w/ exhale at top Pelvic press 5 sec x 10  Piriformis stretch supine 30 sec x 3 Rt/Lt; diagonal knee to chest 30 sec x 2 Rt/Lt  Quadriped alt leg x 3 ea, then arm and leg x 5 ea Worked on finding good QL/lumbar stretch: best was flexion at sink with traction and then L/R sides(from last visit not as successful today) Walking laps around gym  between manual work and stretches  Myofacial ball release work posterior Lt posterior hip/piriformis and QL standing   Statistician and UPA Mobs to lumbar and lower thoracic STM to bil lumbar and lower throacic paraspinals TPR to Right QL  Modalities:  Moist heat x 10 min    01/07/22 Prone press ups 3x 10, last set with exhale at top; pain went from 6 to 3/10. Quadriped alt leg x 3 ea, then arm and leg x 5 ea Worked on finding good QL/lumbar stretch: best was flexion at sink with traction and then L and R sides Sit to stand hinged hip core tight x 5, then 10 with 10#  Modified dead lift from 8" stool 20# KB x 5 hinged hip core tight, then without stool x 10 Shoulder extension 15# x 20 for core Open Book x 5 ea Manual  CPA and UPA Mobs to lumbar and lower thoracic STM to bil lumbar and lower throacic paraspinals TPR to Right QL    PATIENT EDUCATION:  Education details: PT eval findings, anticipated POC, and initial HEP  Person educated: Patient Education method: Explanation, Demonstration, Verbal cues, and Handouts Education comprehension: verbalized understanding and returned demonstration  HOME EXERCISE PROGRAM: Access Code: [email or web address] URL: [email or web address] Date: 12/29/2021 Prepared by: Gillermo Murdoch  Exercises - Prone Press Up  - 3-4 x daily - 7 x weekly - 1-3 sets - 10 reps - Lunge with Counter Support  - 1 x daily - 7 x weekly - 3 sets - 10 reps - Standing Lumbar Extension  - 2 x daily - 7 x weekly - 1 sets - 2-3 reps - 2-3 sec  hold - Standing Back Extension at [place]  - 2 x daily - 7 x weekly - 1 sets - 3 reps - 2-3 sec  hold - Standing Lumbar Extension with Counter  - 2 x daily - 7 x weekly - 1 sets - 3 reps - 2-3 sec  hold - Supine Transversus Abdominis Bracing with Pelvic Floor Contraction  - 2 x daily - 7 x weekly - 1 sets - 10 reps - 10sec  hold - Hooklying Sequential Leg March and Lower  - 1 x daily - 7 x weekly - 3 sets - 10 reps - Primal Push Up   - 1 x daily - 7 x weekly - 1 sets - 5 reps - max hold - Supine Piriformis Stretch with Leg Straight  - 2 x daily - 7 x weekly - 1 sets - 3 reps - 30 sec  hold - Supine Pelvic Floor Stretch  - 2 x daily - 7 x weekly - Standing Row with Resistance with Anchored Resistance at Chest Height Palms Down  - 2 x daily - 7 x weekly - 1-3 sets - 10 reps - 3 sec  hold -  Shoulder extension with resistance - Neutral  - 1 x daily - 7 x weekly - 1-2 sets - 10 reps - 3-5 sec  hold - Anti-Rotation Lateral Stepping with Press  - 2 x daily - 7 x weekly - 1-2 sets - 10 reps - 2-3 sec  hold - Sit to Stand  - 2 x daily - 7 x weekly - 1 sets - 10 reps - 3-5 sec  hold - Modified Deadlift with Pelvic Contraction  - 1 x daily - 7 x weekly - 1 sets - 10 reps  ASSESSMENT:  CLINICAL IMPRESSION: Continued to work on improving glute/piriformis, psoas major, and QL tightness (L>R). Continued to progress pt's core strengthening and hip strengthening as tolerated.    GOALS: Goals reviewed with patient? Yes  SHORT TERM GOALS: Target date: 12/29/2021 (Remove Blue Hyperlink)  Patient will be independent with initial HEP.  Baseline:  Goal status: MET  2.  Patient will report centralization of radicular symptoms.  Baseline:  Goal status: MET    LONG TERM GOALS: Target date: 01/26/2022  (Remove Blue Hyperlink)  Patient will be independent with advanced/ongoing HEP to improve outcomes and carryover.  Baseline:  Goal status: INITIAL  2.  Patient will report 75% improvement in low back pain to improve QOL.  Baseline:  Goal status: INITIAL  3.  Patient will be able to sit with equal WB and no increased sx.   Baseline:  Goal status: INITIAL  4.  Patient will demonstrate improved functional strength as demonstrated by ability to squat and perform floor to stand transfer on L LE without difficulty. Baseline:  Goal status: INITIAL  5.   Patient will tolerate 45-60 min of standing and walking to perform normal ADLS  including cooking. Baseline:  Goal status: INITIAL  7.  Patient able to return to running without radicular sx into L LE.  Baseline:  Goal status: INITIAL  PLAN:  PT FREQUENCY: 2x/week  PT DURATION: 6 weeks  PLANNED INTERVENTIONS: Therapeutic exercises, Therapeutic activity, Neuromuscular re-education, Balance training, Gait training, Patient/Family education, Self Care, Joint mobilization, Aquatic Therapy, Dry Needling, Electrical stimulation, Spinal mobilization, Cryotherapy, Moist heat, Taping, Traction, Ultrasound, Ionotophoresis 4mg/ml Dexamethasone, and Manual therapy.  PLAN FOR NEXT SESSION: check LTG status, Assess response to extension protocol, work on core strength, functional LE strength   Gellen April Ma L Nonato, PT 01/19/2022, 9:29 AM  

## 2022-01-21 ENCOUNTER — Encounter: Payer: Self-pay | Admitting: Physical Therapy

## 2022-01-21 ENCOUNTER — Ambulatory Visit: Payer: 59 | Admitting: Physical Therapy

## 2022-01-21 DIAGNOSIS — M5416 Radiculopathy, lumbar region: Secondary | ICD-10-CM

## 2022-01-21 DIAGNOSIS — M6281 Muscle weakness (generalized): Secondary | ICD-10-CM

## 2022-01-21 DIAGNOSIS — R252 Cramp and spasm: Secondary | ICD-10-CM

## 2022-01-21 NOTE — Therapy (Signed)
OUTPATIENT PHYSICAL THERAPY THORACOLUMBAR TREATMENT   Patient Name: Andrea Marquez MRN: [id number] DOB:08/07/1970, 51 y.o., female Today's Date: 01/21/2022  END OF SESSION:  PT End of Session - 01/21/22 0801     Visit Number 9    Date for PT Re-Evaluation 01/26/22    Authorization Type [employer]    PT Start Time 0801    PT Stop Time 0840    PT Time Calculation (min) 39 min    Activity Tolerance Patient tolerated treatment well    Behavior During Therapy WFL for tasks assessed/performed                 Past Medical History:  Diagnosis Date   ADHD    Anxiety    Atrial tachycardia    Back pain    Depression    severe   Female infertility    GERD (gastroesophageal reflux disease)    Hyperlipidemia    Hypertension    Obesity    Palpitations    PONV (postoperative nausea and vomiting)    Prediabetes    Severe anxiety    Vertigo    Past Surgical History:  Procedure Laterality Date   AUGMENTATION MAMMAPLASTY     BREAST ENHANCEMENT SURGERY     LAPAROSCOPY     NSVD     x2   OOPHORECTOMY     Single    TOTAL VAGINAL HYSTERECTOMY     TUBAL LIGATION     Patient Active Problem List   Diagnosis Date Noted   Pain and swelling of left knee 07/23/2021   Encounter for weight management 06/20/2020   Acute stress reaction 04/16/2020   Vertigo 11/20/2019   Seasonal allergies 05/17/2019   Cold sore 04/05/2019   Atrial tachycardia 08/23/2018   Hyperhidrosis 07/18/2018   Stress incontinence 07/18/2018   Frequent urination 07/18/2018   Hematuria 10/24/2017   Attention deficit hyperactivity disorder (ADHD), combined type 50/72/2575   Synovial plica syndrome of right knee 04/29/2016   Severe episode of recurrent major depressive disorder, without psychotic features ([place]) 08/21/2015   Generalized anxiety disorder 08/21/2015   Hypertension 04/07/2015   Shingles 05/05/2012   Lumbar spondylosis 03/17/2012   Morbid obesity ([place]) 03/21/2008   INSOMNIA 06/20/2007    Depression with anxiety 04/19/2007    PCP: Hali Marry, MD  REFERRING PROVIDER: Thane Edu [facility]  REFERRING DIAG: M54.16 (ICD-10-CM) - Radiculopathy, lumbar region  Rationale for Evaluation and Treatment: Rehabilitation  THERAPY DIAG:  Radiculopathy, lumbar region  Muscle weakness (generalized)  Cramp and spasm  ONSET DATE: March 2023  SUBJECTIVE:  SUBJECTIVE STATEMENT: Pt states she tried to find her physioball but it was deflated. Has been doing her exercises otherwise. Still having problems after getting up from seated with pushing weight into L LE.   PERTINENT HISTORY:  Hysterectomy, anxiety, HTN  Eval: Patient reports left LE sciatica starting in March. Facet injections helped but epidural did not. She has not been able to run since pain started. Left leg is weak; stairs are getting better.  PAIN:  Are you having pain? Yes: NPRS scale: 5/10 Pain location: left low back hip to leg to back of the mid thigh Pain description: pressure low back; burn Aggravating factors: sitting, cooking and prolonged standing Relieving factors: meds a little, prone fig 4 sometimes  PRECAUTIONS: None   LIVING ENVIRONMENT: Lives with: lives with their family Lives in: House/apartment Stairs: Yes: Internal: 13 steps; can reach both and External: 8 steps; can reach both Has following equipment at home:  uses crawling if she has been active cleaning; must use rail and None  OCCUPATION: on the computer  PATIENT GOALS: get back to running, normalizing ADLS  NEXT MD VISIT: none scheduled  OBJECTIVE:   DIAGNOSTIC FINDINGS:  MRI 04/14/21 - IMPRESSION: 1. Lumbar spine degeneration primarily affecting the facets and especially at L4-5 where there is anterolisthesis and active left facet  arthritis. 2. Chronic and noncompressive disc protrusions at T11-12 and L3-4.  MUSCLE LENGTH: HS: mild tighness bil Quads: ITB: Piriformis: mild tightness R Hip Flexors: WNL Heelcords:WNL   POSTURE: weight shift right and tight R QL  LUMBAR ROM:   AROM eval  Flexion full  Extension full  Right lateral flexion full  Left lateral flexion full  Right rotation full  Left rotation Full but mild tightness   (Blank rows = not tested)  LOWER EXTREMITY MMT:    MMT Right eval Left eval  Hip flexion 4+ 4+  Hip extension 5 5  Hip abduction 5 5  Hip adduction 5 5  Hip internal rotation    Hip external rotation    Knee flexion 5 5  Knee extension 5 5  Ankle dorsiflexion  5  Ankle plantarflexion    Ankle inversion  5  Ankle eversion  5   (Blank rows = not tested)  FUNCTIONAL TESTS: Squat: shifts R SL Squat: with UE support WNL  1/2 kneel to stand: must use hand to push through thigh     TODAY'S TREATMENT:                                                                                                                              DATE:   01/21/22 THEREX Sitting Hamstring stretch 2x 30 sec bilat Piriformis stretch 2x 30 sec bilat Pball flexion and then lateral flexion 2x 30 sec each bilat Hip flexor stretch 2x30 sec with arms outstretched bilat Hip hinge x10 Hip hinge into standing in staggered stance to increase L LE weightbearing 2x10 Prone  press ups 2  x 10, last set w/ exhale at top Alternating hip ext and UE raises x10  Hip flexor stretch with mobilization for anterior rotation on L x10 Supine  SLR 2x10  Bridge 2x10  MANUAL  CPA and UPA Mobs L PSIS and sacrum PA mobs on L PSIS in sitting STM to bil lumbar and lower throacic paraspinals   01/19/22 THEREX Sitting Hamstring stretch 2x 30 sec bilat Piriformis stretch 2x 30 sec bilat Pball flexion and then lateral flexion 2x 30 sec each bilat Hip flexor stretch 2x30 sec with arms outstretched bilat Prone   press ups 2 x 10, last set w/ exhale at top Hip ext 2 x10 Supine  PPT 2x10  PPT + bridge 2x10 Standing  Modified deadlift 20# KB 2x10  MANUAL  CPA and UPA Mobs to lumbar and lower thoracic STM to bil lumbar and lower throacic paraspinals TPR to Right QL    PATIENT EDUCATION:  Education details: ongoing PT POC Sitting with small towel behind L PSIS  Person educated: Patient Education method: Explanation, Demonstration, Verbal cues, and Handouts Education comprehension: verbalized understanding and returned demonstration  HOME EXERCISE PROGRAM: Access Code: [email or web address] URL: [email or web address] Date: 12/29/2021 Prepared by: Gillermo Murdoch  Exercises - Prone Press Up  - 3-4 x daily - 7 x weekly - 1-3 sets - 10 reps - Lunge with Counter Support  - 1 x daily - 7 x weekly - 3 sets - 10 reps - Standing Lumbar Extension  - 2 x daily - 7 x weekly - 1 sets - 2-3 reps - 2-3 sec  hold - Standing Back Extension at [place]  - 2 x daily - 7 x weekly - 1 sets - 3 reps - 2-3 sec  hold - Standing Lumbar Extension with Counter  - 2 x daily - 7 x weekly - 1 sets - 3 reps - 2-3 sec  hold - Supine Transversus Abdominis Bracing with Pelvic Floor Contraction  - 2 x daily - 7 x weekly - 1 sets - 10 reps - 10sec  hold - Hooklying Sequential Leg March and Lower  - 1 x daily - 7 x weekly - 3 sets - 10 reps - Primal Push Up  - 1 x daily - 7 x weekly - 1 sets - 5 reps - max hold - Supine Piriformis Stretch with Leg Straight  - 2 x daily - 7 x weekly - 1 sets - 3 reps - 30 sec  hold - Supine Pelvic Floor Stretch  - 2 x daily - 7 x weekly - Standing Row with Resistance with Anchored Resistance at Chest Height Palms Down  - 2 x daily - 7 x weekly - 1-3 sets - 10 reps - 3 sec  hold - Shoulder extension with resistance - Neutral  - 1 x daily - 7 x weekly - 1-2 sets - 10 reps - 3-5 sec  hold - Anti-Rotation Lateral Stepping with Press  - 2 x daily - 7 x weekly - 1-2 sets - 10 reps - 2-3 sec  hold - Sit  to Stand  - 2 x daily - 7 x weekly - 1 sets - 10 reps - 3-5 sec  hold - Modified Deadlift with Pelvic Contraction  - 1 x daily - 7 x weekly - 1 sets - 10 reps  ASSESSMENT:  CLINICAL IMPRESSION: Pt found to be in L posterior innominate rotation in sitting perhaps leading to her increased tightness with prolonged sitting (feels she tightens within 15 min).  Discussed sitting with small towel against L PSIS to encourage increased anterior rotation. Continued to work on core and hip strengthening. She would benefit from continued PT to work on her deficits.    GOALS: Goals reviewed with patient? Yes  SHORT TERM GOALS: Target date: 12/29/2021 (Remove Blue Hyperlink)  Patient will be independent with initial HEP.  Baseline:  Goal status: MET  2.  Patient will report centralization of radicular symptoms.  Baseline:  Goal status: MET    LONG TERM GOALS: Target date: 01/26/2022  (Remove Blue Hyperlink)  Patient will be independent with advanced/ongoing HEP to improve outcomes and carryover.  Baseline:  Goal status: MET  2.  Patient will report 75% improvement in low back pain to improve QOL.  Baseline:  Goal status: IN PROGRESS  3.  Patient will be able to sit with equal WB and no increased sx.   Baseline: Mild L posterior rotation in sitting but no symptoms (12/28) Goal status: IN PROGRESS  4.  Patient will demonstrate improved functional strength as demonstrated by ability to squat and perform floor to stand transfer on L LE without difficulty. Baseline: Still reports difficulty with sit<>stand and placing weight through L LE (12/28) Goal status: IN PROGRESS  5.   Patient will tolerate 45-60 min of standing and walking to perform normal ADLS including cooking. Baseline:  Goal status: IN PROGRESS  7.  Patient able to return to running without radicular sx into L LE.  Baseline: Currently no radicular symptoms but not yet running (12/28) Goal status: IN PROGRESS  PLAN:  PT  FREQUENCY: 2x/week  PT DURATION: 6 weeks  PLANNED INTERVENTIONS: Therapeutic exercises, Therapeutic activity, Neuromuscular re-education, Balance training, Gait training, Patient/Family education, Self Care, Joint mobilization, Aquatic Therapy, Dry Needling, Electrical stimulation, Spinal mobilization, Cryotherapy, Moist heat, Taping, Traction, Ultrasound, Ionotophoresis 21m/ml Dexamethasone, and Manual therapy.  PLAN FOR NEXT SESSION: check LTG status, Check for SIJ rotation, assess response to extension protocol, work on core strength, functional LE strength   Delayne Sanzo April Ma L Abdulai Blaylock, PT 01/21/2022, 8:01 AM

## 2022-01-26 ENCOUNTER — Ambulatory Visit: Payer: 59 | Attending: Neurological Surgery | Admitting: Rehabilitative and Restorative Service Providers"

## 2022-01-26 ENCOUNTER — Encounter: Payer: Self-pay | Admitting: Rehabilitative and Restorative Service Providers"

## 2022-01-26 DIAGNOSIS — R252 Cramp and spasm: Secondary | ICD-10-CM | POA: Insufficient documentation

## 2022-01-26 DIAGNOSIS — M5416 Radiculopathy, lumbar region: Secondary | ICD-10-CM | POA: Insufficient documentation

## 2022-01-26 DIAGNOSIS — M6281 Muscle weakness (generalized): Secondary | ICD-10-CM | POA: Insufficient documentation

## 2022-01-26 NOTE — Therapy (Addendum)
OUTPATIENT PHYSICAL THERAPY THORACOLUMBAR TREATMENT AND RE-CERT   Patient Name: Andrea Marquez MRN: [id number] DOB:September 26, 1970, 52 y.o., female Today's Date: 01/26/2022  END OF SESSION:  PT End of Session - 01/26/22 0844     Visit Number 10    Date for PT Re-Evaluation 03/09/22    Authorization Type U[employer]    PT Start Time 0844    PT Stop Time 0930    PT Time Calculation (min) 46 min    Activity Tolerance Patient tolerated treatment well                 Past Medical History:  Diagnosis Date   ADHD    Anxiety    Atrial tachycardia    Back pain    Depression    severe   Female infertility    GERD (gastroesophageal reflux disease)    Hyperlipidemia    Hypertension    Obesity    Palpitations    PONV (postoperative nausea and vomiting)    Prediabetes    Severe anxiety    Vertigo    Past Surgical History:  Procedure Laterality Date   AUGMENTATION MAMMAPLASTY     BREAST ENHANCEMENT SURGERY     LAPAROSCOPY     NSVD     x2   OOPHORECTOMY     Single    TOTAL VAGINAL HYSTERECTOMY     TUBAL LIGATION     Patient Active Problem List   Diagnosis Date Noted   Pain and swelling of left knee 07/23/2021   Encounter for weight management 06/20/2020   Acute stress reaction 04/16/2020   Vertigo 11/20/2019   Seasonal allergies 05/17/2019   Cold sore 04/05/2019   Atrial tachycardia 08/23/2018   Hyperhidrosis 07/18/2018   Stress incontinence 07/18/2018   Frequent urination 07/18/2018   Hematuria 10/24/2017   Attention deficit hyperactivity disorder (ADHD), combined type 97/28/2060   Synovial plica syndrome of right knee 04/29/2016   Severe episode of recurrent major depressive disorder, without psychotic features ([place]) 08/21/2015   Generalized anxiety disorder 08/21/2015   Hypertension 04/07/2015   Shingles 05/05/2012   Lumbar spondylosis 03/17/2012   Morbid obesity ([place]) 03/21/2008   INSOMNIA 06/20/2007   Depression with anxiety 04/19/2007    PCP: Hali Marry, MD  REFERRING PROVIDER: Thane Edu [facility]  REFERRING DIAG: M54.16 (ICD-10-CM) - Radiculopathy, lumbar region  Rationale for Evaluation and Treatment: Rehabilitation  THERAPY DIAG:  Radiculopathy, lumbar region  Muscle weakness (generalized)  Cramp and spasm  ONSET DATE: March 2023  SUBJECTIVE:  SUBJECTIVE STATEMENT: Pt reports good response to last treatment. She finds that she does much better if she stretches before exercises.   PERTINENT HISTORY:  Hysterectomy, anxiety, HTN  Eval: Patient reports left LE sciatica starting in March. Facet injections helped but epidural did not. She has not been able to run since pain started. Left leg is weak; stairs are getting better.  PAIN:  Are you having pain? Yes: NPRS scale: 5/10 Pain location: left low back hip to leg to back of the mid thigh Pain description: pressure low back; burn Aggravating factors: sitting, cooking and prolonged standing Relieving factors: meds a little, prone fig 4 sometimes  PRECAUTIONS: None   LIVING ENVIRONMENT: Lives with: lives with their family Lives in: House/apartment Stairs: Yes: Internal: 13 steps; can reach both and External: 8 steps; can reach both Has following equipment at home:  uses crawling if she has been active cleaning; must use rail and None  OCCUPATION: on the computer  PATIENT GOALS: get back to running, normalizing ADLS  NEXT MD VISIT: none scheduled  OBJECTIVE:   DIAGNOSTIC FINDINGS:  MRI 04/14/21 - IMPRESSION: 1. Lumbar spine degeneration primarily affecting the facets and especially at L4-5 where there is anterolisthesis and active left facet arthritis. 2. Chronic and noncompressive disc protrusions at T11-12 and L3-4.  MUSCLE LENGTH: HS: mild tighness  bil Quads: ITB: Piriformis: mild tightness R Hip Flexors: WNL Heelcords:WNL   POSTURE: weight shift right and tight R QL  LUMBAR ROM:   AROM eval  Flexion full  Extension full  Right lateral flexion full  Left lateral flexion full  Right rotation full  Left rotation Full but mild tightness   (Blank rows = not tested)  LOWER EXTREMITY MMT:    MMT Right eval Left eval  Hip flexion 4+ 4+  Hip extension 5 5  Hip abduction 5 5  Hip adduction 5 5  Hip internal rotation    Hip external rotation    Knee flexion 5 5  Knee extension 5 5  Ankle dorsiflexion  5  Ankle plantarflexion    Ankle inversion  5  Ankle eversion  5   (Blank rows = not tested)  FUNCTIONAL TESTS: Squat: shifts R SL Squat: with UE support WNL  1/2 kneel to stand: must use hand to push through thigh     TODAY'S TREATMENT:                                                                                                                              01/26/22 THEREX Sitting Hamstring stretch 2x 30 sec bilat Piriformis stretch 2x 30 sec bilat Pball flexion and then lateral flexion 2x 30 sec each bilat Hip flexor stretch 2x30 sec with arms outstretched bilat Prone  Hip ext 2x10 knees flexed  Plank 2x20 sec  Bird dog x10 Supine  SIJ correction 3x5 sec MANUAL  CPA and UPA Mobs L PSIS and sacrum PA mobs on L PSIS in  sitting STM to bil lumbar paraspinals    01/21/22 THEREX Sitting Hamstring stretch 2x 30 sec bilat Piriformis stretch 2x 30 sec bilat Pball flexion and then lateral flexion 2x 30 sec each bilat Hip flexor stretch 2x30 sec with arms outstretched bilat Hip hinge x10 Hip hinge into standing in staggered stance to increase L LE weightbearing 2x10 Prone  press ups 2 x 10, last set w/ exhale at top Alternating hip ext and UE raises x10  Hip flexor stretch with mobilization for anterior rotation on L x10 Supine  SLR 2x10  Bridge 2x10  MANUAL  CPA and UPA Mobs L PSIS and sacrum PA  mobs on L PSIS in sitting STM to bil lumbar and lower throacic paraspinals    PATIENT EDUCATION:  Education details: ongoing PT POC Sitting with small towel behind L PSIS  Person educated: Patient Education method: Consulting civil engineer, Media planner, Verbal cues, and Handouts Education comprehension: verbalized understanding and returned demonstration  HOME EXERCISE PROGRAM: Access Code: [email or web address] URL: [email or web address] Date: 01/26/2022 Prepared by: Estill Bamberg April Thurnell Garbe  Exercises - Prone Press Up  - 3-4 x daily - 7 x weekly - 1-3 sets - 10 reps - Lunge with Counter Support  - 1 x daily - 7 x weekly - 3 sets - 10 reps - Standing Lumbar Extension  - 2 x daily - 7 x weekly - 1 sets - 2-3 reps - 2-3 sec  hold - Standing Back Extension at [place]  - 2 x daily - 7 x weekly - 1 sets - 3 reps - 2-3 sec  hold - Standing Lumbar Extension with Counter  - 2 x daily - 7 x weekly - 1 sets - 3 reps - 2-3 sec  hold - Supine Transversus Abdominis Bracing with Pelvic Floor Contraction  - 2 x daily - 7 x weekly - 1 sets - 10 reps - 10sec  hold - Hooklying Sequential Leg March and Lower  - 1 x daily - 7 x weekly - 3 sets - 10 reps - Primal Push Up  - 1 x daily - 7 x weekly - 1 sets - 5 reps - max hold - Supine Piriformis Stretch with Leg Straight  - 2 x daily - 7 x weekly - 1 sets - 3 reps - 30 sec  hold - Supine Pelvic Floor Stretch  - 2 x daily - 7 x weekly - Standing Row with Resistance with Anchored Resistance at Chest Height Palms Down  - 2 x daily - 7 x weekly - 1-3 sets - 10 reps - 3 sec  hold - Shoulder extension with resistance - Neutral  - 1 x daily - 7 x weekly - 1-2 sets - 10 reps - 3-5 sec  hold - Anti-Rotation Lateral Stepping with Press  - 2 x daily - 7 x weekly - 1-2 sets - 10 reps - 2-3 sec  hold - Sit to Stand  - 2 x daily - 7 x weekly - 1 sets - 10 reps - 3-5 sec  hold - Modified Deadlift with Pelvic Contraction  - 1 x daily - 7 x weekly - 1 sets - 10 reps - Seated  Flexion Stretch with Swiss Ball  - 1 x daily - 7 x weekly - 2 sets - 30 sec hold - Seated Thoracic Flexion and Rotation with Swiss Ball  - 1 x daily - 7 x weekly - 2 sets - 30 sec hold - Half Kneeling Hip Flexor Stretch with Sidebend  -  1 x daily - 7 x weekly - 2 sets - 30 sec hold - Standard Plank  - 1 x daily - 7 x weekly - 2 sets - 20 sec hold - Prone Hip Extension with Bent Knee  - 1 x daily - 7 x weekly - 2 sets - 10 reps - Supine SI Joint Self-Correction (Mirrored)  - 2 x daily - 7 x weekly - 3 reps - 5 sec hold  ASSESSMENT:  CLINICAL IMPRESSION: Pt notes good response to last PT session. Less tightness in sitting and reports less limping after prolonged sitting with blocking of PSIS in sitting using towel. Notes some L calf tightness after end of this session and centralization of numbness into low back after mobs. Continued to work on core and hip strengthening with focus on improving SI alignment. Pt would benefit from continued PT to work on improving her pain and meet her LTGs.    GOALS: Goals reviewed with patient? Yes  SHORT TERM GOALS: Target date: 12/29/2021 (Remove Blue Hyperlink)  Patient will be independent with initial HEP.  Baseline:  Goal status: MET  2.  Patient will report centralization of radicular symptoms.  Baseline:  Goal status: MET    REVISED LONG TERM GOALS: Target date: 03/09/2022   Patient will be independent with advanced/ongoing HEP to improve outcomes and carryover.  Baseline:  Goal status: MET  2.  Patient will report 75% improvement in low back pain to improve QOL.  Baseline:  Goal status: IN PROGRESS  3.  Patient will be able to sit with equal WB and no increased sx.   Baseline: Mild L posterior rotation in sitting but no symptoms (12/28) Goal status: IN PROGRESS  4.  Patient will demonstrate improved functional strength as demonstrated by ability to squat and perform floor to stand transfer on L LE without difficulty. Baseline: Still  reports difficulty with sit<>stand and placing weight through L LE (12/28) Goal status: IN PROGRESS  5.   Patient will tolerate 45-60 min of standing and walking to perform normal ADLS including cooking. Baseline:  Goal status: IN PROGRESS  7.  Patient able to return to running without radicular sx into L LE.  Baseline: Currently no radicular symptoms but not yet running (12/28) Goal status: IN PROGRESS  PLAN:  PT FREQUENCY: 2x/week  PT DURATION: 6 weeks  PLANNED INTERVENTIONS: Therapeutic exercises, Therapeutic activity, Neuromuscular re-education, Balance training, Gait training, Patient/Family education, Self Care, Joint mobilization, Aquatic Therapy, Dry Needling, Electrical stimulation, Spinal mobilization, Cryotherapy, Moist heat, Taping, Traction, Ultrasound, Ionotophoresis 68m/ml Dexamethasone, and Manual therapy.  PLAN FOR NEXT SESSION: Check for SIJ rotation, assess response to extension protocol, work on core strength, functional LE strength  Gellen April Ma L Nonato, PT 01/26/2022, 9:33 AM  Khalon Cansler PNilda Simmer PT (obtained subjective) 01/26/2022, 8:50 AM

## 2022-01-26 NOTE — Addendum Note (Signed)
Addended by: Colbert Ewing MARIE L on: 01/26/2022 11:14 AM   Modules accepted: Orders

## 2022-01-28 ENCOUNTER — Ambulatory Visit: Payer: 59 | Admitting: Physical Therapy

## 2022-01-28 ENCOUNTER — Encounter: Payer: Self-pay | Admitting: Physical Therapy

## 2022-01-28 DIAGNOSIS — M5416 Radiculopathy, lumbar region: Secondary | ICD-10-CM

## 2022-01-28 DIAGNOSIS — M6281 Muscle weakness (generalized): Secondary | ICD-10-CM

## 2022-01-28 DIAGNOSIS — R252 Cramp and spasm: Secondary | ICD-10-CM

## 2022-01-28 NOTE — Therapy (Signed)
OUTPATIENT PHYSICAL THERAPY THORACOLUMBAR TREATMENT AND RE-CERT   Patient Name: Andrea Marquez MRN: [id number] DOB:[birth year], 52 y.o., female Today's Date: 01/28/2022  END OF SESSION:  PT End of Session - 01/28/22 1101     Visit Number 11    Date for PT Re-Evaluation 03/09/22    Authorization Type U[employer]    PT Start Time 1101    PT Stop Time 1145    PT Time Calculation (min) 44 min    Activity Tolerance Patient tolerated treatment well                  Past Medical History:  Diagnosis Date   ADHD    Anxiety    Atrial tachycardia    Back pain    Depression    severe   Female infertility    GERD (gastroesophageal reflux disease)    Hyperlipidemia    Hypertension    Obesity    Palpitations    PONV (postoperative nausea and vomiting)    Prediabetes    Severe anxiety    Vertigo    Past Surgical History:  Procedure Laterality Date   AUGMENTATION MAMMAPLASTY     BREAST ENHANCEMENT SURGERY     LAPAROSCOPY     NSVD     x2   OOPHORECTOMY     Single    TOTAL VAGINAL HYSTERECTOMY     TUBAL LIGATION     Patient Active Problem List   Diagnosis Date Noted   Pain and swelling of left knee 07/23/2021   Encounter for weight management 06/20/2020   Acute stress reaction 04/16/2020   Vertigo 11/20/2019   Seasonal allergies 05/17/2019   Cold sore 04/05/2019   Atrial tachycardia 08/23/2018   Hyperhidrosis 07/18/2018   Stress incontinence 07/18/2018   Frequent urination 07/18/2018   Hematuria 10/24/2017   Attention deficit hyperactivity disorder (ADHD), combined type 54/27/0623   Synovial plica syndrome of right knee 04/29/2016   Severe episode of recurrent major depressive disorder, without psychotic features ([place]) 08/21/2015   Generalized anxiety disorder 08/21/2015   Hypertension 04/07/2015   Shingles 05/05/2012   Lumbar spondylosis 03/17/2012   Morbid obesity ([place]) 03/21/2008   INSOMNIA 06/20/2007   Depression with anxiety 04/19/2007    PCP: Hali Marry, MD  REFERRING PROVIDER: [facility]  REFERRING DIAG: M54.16 (ICD-10-CM) - Radiculopathy, lumbar region  Rationale for Evaluation and Treatment: Rehabilitation  THERAPY DIAG:  Radiculopathy, lumbar region  Muscle weakness (generalized)  Cramp and spasm  ONSET DATE: March 2023  SUBJECTIVE:  SUBJECTIVE STATEMENT: Pt states she felt pretty good after last session. Did all of her stretching this morning. Pt states by the end of her work day after sitting a while she will still stiffen up and end up limping.   PERTINENT HISTORY:  Hysterectomy, anxiety, HTN  Eval: Patient reports left LE sciatica starting in March. Facet injections helped but epidural did not. She has not been able to run since pain started. Left leg is weak; stairs are getting better.  PAIN:  Are you having pain? Yes: NPRS scale: 4/10 Pain location: left low back hip to leg to back of the mid thigh Pain description: pressure low back; burn Aggravating factors: sitting, cooking and prolonged standing Relieving factors: meds a little, prone fig 4 sometimes  PRECAUTIONS: None   LIVING ENVIRONMENT: Lives with: lives with their family Lives in: House/apartment Stairs: Yes: Internal: 13 steps; can reach both and External: 8 steps; can reach both Has following equipment at home:  uses crawling if she has been active cleaning; must use rail and None  OCCUPATION: on the computer  PATIENT GOALS: get back to running, normalizing ADLS  NEXT MD VISIT: none scheduled  OBJECTIVE:   DIAGNOSTIC FINDINGS:  MRI 04/14/21 - IMPRESSION: 1. Lumbar spine degeneration primarily affecting the facets and especially at L4-5 where there is anterolisthesis and active left facet arthritis. 2. Chronic and noncompressive disc  protrusions at T11-12 and L3-4.  MUSCLE LENGTH: HS: mild tighness bil Quads: ITB: Piriformis: mild tightness R Hip Flexors: WNL Heelcords:WNL   POSTURE: weight shift right and tight R QL  LUMBAR ROM:   AROM eval  Flexion full  Extension full  Right lateral flexion full  Left lateral flexion full  Right rotation full  Left rotation Full but mild tightness   (Blank rows = not tested)  LOWER EXTREMITY MMT:    MMT Right eval Left eval  Hip flexion 4+ 4+  Hip extension 5 5  Hip abduction 5 5  Hip adduction 5 5  Hip internal rotation    Hip external rotation    Knee flexion 5 5  Knee extension 5 5  Ankle dorsiflexion  5  Ankle plantarflexion    Ankle inversion  5  Ankle eversion  5   (Blank rows = not tested)  FUNCTIONAL TESTS: Squat: shifts R SL Squat: with UE support WNL  1/2 kneel to stand: must use hand to push through thigh     TODAY'S TREATMENT:                                                                                                                              01/28/22 THEREX Treadmill 2.4 mph x 5 min Sitting  Piriformis stretch 2x30 sec  Figure 4 stretch 2x30 sec  On pball: LAQ 2x10  Hip flexor stretch with arms outstretched 2x30 sec R&L Supine  Laying on small towel wrapped around marker against PSIS x2 min  SIJ correction 5x5 sec (  L LE extended, R LE flexed)  Bridge with 8# weight 10x3 sec Prone  Plank 2x20 sec Standing  Wall squat + PPT 3x10  01/26/22 THEREX Sitting Hamstring stretch 2x 30 sec bilat Piriformis stretch 2x 30 sec bilat Pball flexion and then lateral flexion 2x 30 sec each bilat Hip flexor stretch 2x30 sec with arms outstretched bilat Prone  Hip ext 2x10 knees flexed  Plank 2x20 sec  Bird dog x10 Supine  SIJ correction 3x5 sec MANUAL  CPA and UPA Mobs L PSIS and sacrum PA mobs on L PSIS in sitting STM to bil lumbar paraspinals    01/21/22 THEREX Sitting Hamstring stretch 2x 30 sec bilat Piriformis stretch 2x  30 sec bilat Pball flexion and then lateral flexion 2x 30 sec each bilat Hip flexor stretch 2x30 sec with arms outstretched bilat Hip hinge x10 Hip hinge into standing in staggered stance to increase L LE weightbearing 2x10 Prone  press ups 2 x 10, last set w/ exhale at top Alternating hip ext and UE raises x10  Hip flexor stretch with mobilization for anterior rotation on L x10 Supine  SLR 2x10  Bridge 2x10  MANUAL  CPA and UPA Mobs L PSIS and sacrum PA mobs on L PSIS in sitting STM to bil lumbar and lower throacic paraspinals    PATIENT EDUCATION:  Education details: ongoing PT POC Sitting with small towel behind L PSIS  Person educated: Patient Education method: Consulting civil engineer, Media planner, Verbal cues, and Handouts Education comprehension: verbalized understanding and returned demonstration  HOME EXERCISE PROGRAM: Access Code: [email or web address] URL: [email or web address] Date: 01/26/2022 Prepared by: Estill Bamberg April Thurnell Garbe  Exercises - Prone Press Up  - 3-4 x daily - 7 x weekly - 1-3 sets - 10 reps - Lunge with Counter Support  - 1 x daily - 7 x weekly - 3 sets - 10 reps - Standing Lumbar Extension  - 2 x daily - 7 x weekly - 1 sets - 2-3 reps - 2-3 sec  hold - Standing Back Extension at [place]  - 2 x daily - 7 x weekly - 1 sets - 3 reps - 2-3 sec  hold - Standing Lumbar Extension with Counter  - 2 x daily - 7 x weekly - 1 sets - 3 reps - 2-3 sec  hold - Supine Transversus Abdominis Bracing with Pelvic Floor Contraction  - 2 x daily - 7 x weekly - 1 sets - 10 reps - 10sec  hold - Hooklying Sequential Leg March and Lower  - 1 x daily - 7 x weekly - 3 sets - 10 reps - Primal Push Up  - 1 x daily - 7 x weekly - 1 sets - 5 reps - max hold - Supine Piriformis Stretch with Leg Straight  - 2 x daily - 7 x weekly - 1 sets - 3 reps - 30 sec  hold - Supine Pelvic Floor Stretch  - 2 x daily - 7 x weekly - Standing Row with Resistance with Anchored Resistance at Chest Height  Palms Down  - 2 x daily - 7 x weekly - 1-3 sets - 10 reps - 3 sec  hold - Shoulder extension with resistance - Neutral  - 1 x daily - 7 x weekly - 1-2 sets - 10 reps - 3-5 sec  hold - Anti-Rotation Lateral Stepping with Press  - 2 x daily - 7 x weekly - 1-2 sets - 10 reps - 2-3 sec  hold - Sit to Stand  -  2 x daily - 7 x weekly - 1 sets - 10 reps - 3-5 sec  hold - Modified Deadlift with Pelvic Contraction  - 1 x daily - 7 x weekly - 1 sets - 10 reps - Seated Flexion Stretch with Swiss Ball  - 1 x daily - 7 x weekly - 2 sets - 30 sec hold - Seated Thoracic Flexion and Rotation with Swiss Ball  - 1 x daily - 7 x weekly - 2 sets - 30 sec hold - Half Kneeling Hip Flexor Stretch with Sidebend  - 1 x daily - 7 x weekly - 2 sets - 30 sec hold - Standard Plank  - 1 x daily - 7 x weekly - 2 sets - 20 sec hold - Prone Hip Extension with Bent Knee  - 1 x daily - 7 x weekly - 2 sets - 10 reps - Supine SI Joint Self-Correction (Mirrored)  - 2 x daily - 7 x weekly - 3 reps - 5 sec hold  ASSESSMENT:  CLINICAL IMPRESSION: Initiated core strengthening in seated position this session. Continued to try and progress pelvic and core strengthening. Pt feels that current regimen has been beneficial. Still trying to maintain pelvic alignment with SIJ corrections.    GOALS: Goals reviewed with patient? Yes  SHORT TERM GOALS: Target date: 12/29/2021 (Remove Blue Hyperlink)  Patient will be independent with initial HEP.  Baseline:  Goal status: MET  2.  Patient will report centralization of radicular symptoms.  Baseline:  Goal status: MET    REVISED LONG TERM GOALS: Target date: 03/09/2022   Patient will be independent with advanced/ongoing HEP to improve outcomes and carryover.  Baseline:  Goal status: MET  2.  Patient will report 75% improvement in low back pain to improve QOL.  Baseline:  Goal status: IN PROGRESS  3.  Patient will be able to sit with equal WB and no increased sx.   Baseline: Mild  L posterior rotation in sitting but no symptoms (12/28) Goal status: IN PROGRESS  4.  Patient will demonstrate improved functional strength as demonstrated by ability to squat and perform floor to stand transfer on L LE without difficulty. Baseline: Still reports difficulty with sit<>stand and placing weight through L LE (12/28) Goal status: IN PROGRESS  5.   Patient will tolerate 45-60 min of standing and walking to perform normal ADLS including cooking. Baseline:  Goal status: IN PROGRESS  7.  Patient able to return to running without radicular sx into L LE.  Baseline: Currently no radicular symptoms but not yet running (12/28) Goal status: IN PROGRESS  PLAN:  PT FREQUENCY: 2x/week  PT DURATION: 6 weeks  PLANNED INTERVENTIONS: Therapeutic exercises, Therapeutic activity, Neuromuscular re-education, Balance training, Gait training, Patient/Family education, Self Care, Joint mobilization, Aquatic Therapy, Dry Needling, Electrical stimulation, Spinal mobilization, Cryotherapy, Moist heat, Taping, Traction, Ultrasound, Ionotophoresis 53m/ml Dexamethasone, and Manual therapy.  PLAN FOR NEXT SESSION: Check for SIJ rotation, assess response to extension protocol, work on core strength, functional LE strength   Gellen April Ma L Nonato, PT 01/28/2022, 11:01 AM

## 2022-02-02 ENCOUNTER — Ambulatory Visit: Payer: 59 | Admitting: Physical Therapy

## 2022-02-02 ENCOUNTER — Encounter: Payer: Self-pay | Admitting: Physical Therapy

## 2022-02-02 DIAGNOSIS — M6281 Muscle weakness (generalized): Secondary | ICD-10-CM

## 2022-02-02 DIAGNOSIS — M5416 Radiculopathy, lumbar region: Secondary | ICD-10-CM

## 2022-02-02 DIAGNOSIS — R252 Cramp and spasm: Secondary | ICD-10-CM

## 2022-02-02 NOTE — Therapy (Signed)
OUTPATIENT PHYSICAL THERAPY THORACOLUMBAR TREATMENT   Patient Name: Andrea Marquez MRN: [id number] DOB:1970-03-15, 52 y.o., female Today's Date: 02/02/2022  END OF SESSION:  PT End of Session - 02/02/22 0858     Visit Number 12    Date for PT Re-Evaluation 03/09/22    Authorization Type UHC    PT Start Time 0855    PT Stop Time 0930    PT Time Calculation (min) 35 min    Activity Tolerance Patient tolerated treatment well    Behavior During Therapy WFL for tasks assessed/performed                  Past Medical History:  Diagnosis Date   ADHD    Anxiety    Atrial tachycardia    Back pain    Depression    severe   Female infertility    GERD (gastroesophageal reflux disease)    Hyperlipidemia    Hypertension    Obesity    Palpitations    PONV (postoperative nausea and vomiting)    Prediabetes    Severe anxiety    Vertigo    Past Surgical History:  Procedure Laterality Date   AUGMENTATION MAMMAPLASTY     BREAST ENHANCEMENT SURGERY     LAPAROSCOPY     NSVD     x2   OOPHORECTOMY     Single    TOTAL VAGINAL HYSTERECTOMY     TUBAL LIGATION     Patient Active Problem List   Diagnosis Date Noted   Pain and swelling of left knee 07/23/2021   Encounter for weight management 06/20/2020   Acute stress reaction 04/16/2020   Vertigo 11/20/2019   Seasonal allergies 05/17/2019   Cold sore 04/05/2019   Atrial tachycardia 08/23/2018   Hyperhidrosis 07/18/2018   Stress incontinence 07/18/2018   Frequent urination 07/18/2018   Hematuria 10/24/2017   Attention deficit hyperactivity disorder (ADHD), combined type 41/66/0630   Synovial plica syndrome of right knee 04/29/2016   Severe episode of recurrent major depressive disorder, without psychotic features ([place]) 08/21/2015   Generalized anxiety disorder 08/21/2015   Hypertension 04/07/2015   Shingles 05/05/2012   Lumbar spondylosis 03/17/2012   Morbid obesity ([place]) 03/21/2008   INSOMNIA 06/20/2007    Depression with anxiety 04/19/2007    PCP: Hali Marry, MD  REFERRING PROVIDER: [facility]  REFERRING DIAG: M54.16 (ICD-10-CM) - Radiculopathy, lumbar region  Rationale for Evaluation and Treatment: Rehabilitation  THERAPY DIAG:  Radiculopathy, lumbar region  Muscle weakness (generalized)  Cramp and spasm  ONSET DATE: March 2023  SUBJECTIVE:  SUBJECTIVE STATEMENT: Pt reports that she has been stretching at home and doing exercises. Can tell things are working. She states she is barely limping out of her truck. States she stretched in the lobby waiting for PT  PERTINENT HISTORY:  Hysterectomy, anxiety, HTN  Eval: Patient reports left LE sciatica starting in March. Facet injections helped but epidural did not. She has not been able to run since pain started. Left leg is weak; stairs are getting better.  PAIN:  Are you having pain? Yes: NPRS scale: 3/10 Pain location: left low back hip to leg to back of the mid thigh Pain description: pressure low back; burn Aggravating factors: sitting, cooking and prolonged standing Relieving factors: meds a little, prone fig 4 sometimes  PRECAUTIONS: None   LIVING ENVIRONMENT: Lives with: lives with their family Lives in: House/apartment Stairs: Yes: Internal: 13 steps; can reach both and External: 8 steps; can reach both Has following equipment at home:  uses crawling if she has been active cleaning; must use rail and None  OCCUPATION: on the computer  PATIENT GOALS: get back to running, normalizing ADLS  NEXT MD VISIT: none scheduled  OBJECTIVE:   DIAGNOSTIC FINDINGS:  MRI 04/14/21 - IMPRESSION: 1. Lumbar spine degeneration primarily affecting the facets and especially at L4-5 where there is anterolisthesis and active  left facet arthritis. 2. Chronic and noncompressive disc protrusions at T11-12 and L3-4.  MUSCLE LENGTH: HS: mild tighness bil Quads: ITB: Piriformis: mild tightness R Hip Flexors: WNL Heelcords:WNL   POSTURE: weight shift right and tight R QL  LUMBAR ROM:   AROM eval  Flexion full  Extension full  Right lateral flexion full  Left lateral flexion full  Right rotation full  Left rotation Full but mild tightness   (Blank rows = not tested)  LOWER EXTREMITY MMT:    MMT Right eval Left eval  Hip flexion 4+ 4+  Hip extension 5 5  Hip abduction 5 5  Hip adduction 5 5  Hip internal rotation    Hip external rotation    Knee flexion 5 5  Knee extension 5 5  Ankle dorsiflexion  5  Ankle plantarflexion    Ankle inversion  5  Ankle eversion  5   (Blank rows = not tested)  FUNCTIONAL TESTS: Squat: shifts R SL Squat: with UE support WNL  1/2 kneel to stand: must use hand to push through thigh     TODAY'S TREATMENT:                                                                                                                              02/02/22 THEREX Treadmill 2.4 mph x 5 min Supine  Laying on small towel wrapped around marker against PSIS x2 min  SIJ correction 5x5 sec (L LE extended, R LE flexed) Prone  Plank 2x25 sec Standing  Wall squat + PPT 3x10  Quad stretch x30 sec  Shoulder ext blue TB with marching  3x10 Sitting  Pball lumbar flexion x30 sec  On pball LAQ 2x10  On pball marching x10  MANUAL THERAPY LAD L hip 2x30 sec Grade II to III inferior hip mobilization and lateral glide  01/28/22 THEREX Treadmill 2.4 mph x 5 min Sitting  Piriformis stretch 2x30 sec  Figure 4 stretch 2x30 sec  On pball: LAQ 2x10  Hip flexor stretch with arms outstretched 2x30 sec R&L Supine  Laying on small towel wrapped around marker against PSIS x2 min  SIJ correction 5x5 sec (L LE extended, R LE flexed)  Bridge with 8# weight 10x3 sec Prone  Plank 2x20  sec Standing  Wall squat + PPT 3x10  01/26/22 THEREX Sitting Hamstring stretch 2x 30 sec bilat Piriformis stretch 2x 30 sec bilat Pball flexion and then lateral flexion 2x 30 sec each bilat Hip flexor stretch 2x30 sec with arms outstretched bilat Prone  Hip ext 2x10 knees flexed  Plank 2x20 sec  Bird dog x10 Supine  SIJ correction 3x5 sec MANUAL  CPA and UPA Mobs L PSIS and sacrum PA mobs on L PSIS in sitting STM to bil lumbar paraspinals    PATIENT EDUCATION:  Education details: ongoing PT POC Sitting with small towel behind L PSIS  Person educated: Patient Education method: Consulting civil engineer, Demonstration, Verbal cues, and Handouts Education comprehension: verbalized understanding and returned demonstration  HOME EXERCISE PROGRAM: Access Code: [email or web address] URL: [email or web address] Date: 01/26/2022 Prepared by: Estill Bamberg April Thurnell Garbe  Exercises - Prone Press Up  - 3-4 x daily - 7 x weekly - 1-3 sets - 10 reps - Lunge with Counter Support  - 1 x daily - 7 x weekly - 3 sets - 10 reps - Standing Lumbar Extension  - 2 x daily - 7 x weekly - 1 sets - 2-3 reps - 2-3 sec  hold - Standing Back Extension at [place]  - 2 x daily - 7 x weekly - 1 sets - 3 reps - 2-3 sec  hold - Standing Lumbar Extension with Counter  - 2 x daily - 7 x weekly - 1 sets - 3 reps - 2-3 sec  hold - Supine Transversus Abdominis Bracing with Pelvic Floor Contraction  - 2 x daily - 7 x weekly - 1 sets - 10 reps - 10sec  hold - Hooklying Sequential Leg March and Lower  - 1 x daily - 7 x weekly - 3 sets - 10 reps - Primal Push Up  - 1 x daily - 7 x weekly - 1 sets - 5 reps - max hold - Supine Piriformis Stretch with Leg Straight  - 2 x daily - 7 x weekly - 1 sets - 3 reps - 30 sec  hold - Supine Pelvic Floor Stretch  - 2 x daily - 7 x weekly - Standing Row with Resistance with Anchored Resistance at Chest Height Palms Down  - 2 x daily - 7 x weekly - 1-3 sets - 10 reps - 3 sec  hold - Shoulder  extension with resistance - Neutral  - 1 x daily - 7 x weekly - 1-2 sets - 10 reps - 3-5 sec  hold - Anti-Rotation Lateral Stepping with Press  - 2 x daily - 7 x weekly - 1-2 sets - 10 reps - 2-3 sec  hold - Sit to Stand  - 2 x daily - 7 x weekly - 1 sets - 10 reps - 3-5 sec  hold - Modified Deadlift with Pelvic Contraction  - 1 x  daily - 7 x weekly - 1 sets - 10 reps - Seated Flexion Stretch with Swiss Ball  - 1 x daily - 7 x weekly - 2 sets - 30 sec hold - Seated Thoracic Flexion and Rotation with Swiss Ball  - 1 x daily - 7 x weekly - 2 sets - 30 sec hold - Half Kneeling Hip Flexor Stretch with Sidebend  - 1 x daily - 7 x weekly - 2 sets - 30 sec hold - Standard Plank  - 1 x daily - 7 x weekly - 2 sets - 20 sec hold - Prone Hip Extension with Bent Knee  - 1 x daily - 7 x weekly - 2 sets - 10 reps - Supine SI Joint Self-Correction (Mirrored)  - 2 x daily - 7 x weekly - 3 reps - 5 sec hold  ASSESSMENT:  CLINICAL IMPRESSION: Continued to progress pt's core and pelvic strengthening/stabilization. Pain continues to improve. Pt with hypomobile L hip joint likely creating excess motion in SIJ on L (notable when performing standing marching with shoulder ext). Initiated hip mobilizations to increase flexion.    GOALS: Goals reviewed with patient? Yes  SHORT TERM GOALS: Target date: 12/29/2021 (Remove Blue Hyperlink)  Patient will be independent with initial HEP.  Baseline:  Goal status: MET  2.  Patient will report centralization of radicular symptoms.  Baseline:  Goal status: MET    REVISED LONG TERM GOALS: Target date: 03/09/2022   Patient will be independent with advanced/ongoing HEP to improve outcomes and carryover.  Baseline:  Goal status: MET  2.  Patient will report 75% improvement in low back pain to improve QOL.  Baseline:  Goal status: IN PROGRESS  3.  Patient will be able to sit with equal WB and no increased sx.   Baseline: Mild L posterior rotation in sitting but no  symptoms (12/28) Goal status: IN PROGRESS  4.  Patient will demonstrate improved functional strength as demonstrated by ability to squat and perform floor to stand transfer on L LE without difficulty. Baseline: Still reports difficulty with sit<>stand and placing weight through L LE (12/28) Goal status: IN PROGRESS  5.   Patient will tolerate 45-60 min of standing and walking to perform normal ADLS including cooking. Baseline:  Goal status: IN PROGRESS  7.  Patient able to return to running without radicular sx into L LE.  Baseline: Currently no radicular symptoms but not yet running (12/28) Goal status: IN PROGRESS  PLAN:  PT FREQUENCY: 2x/week  PT DURATION: 6 weeks  PLANNED INTERVENTIONS: Therapeutic exercises, Therapeutic activity, Neuromuscular re-education, Balance training, Gait training, Patient/Family education, Self Care, Joint mobilization, Aquatic Therapy, Dry Needling, Electrical stimulation, Spinal mobilization, Cryotherapy, Moist heat, Taping, Traction, Ultrasound, Ionotophoresis '4mg'$ /ml Dexamethasone, and Manual therapy.  PLAN FOR NEXT SESSION: Check SIJ rotation, work on L hip hypomobility, work on core strength, functional LE strength   Harlo Fabela April Ma L Joran Kallal, PT 02/02/2022, 8:58 AM

## 2022-02-04 ENCOUNTER — Ambulatory Visit: Payer: 59 | Admitting: Physical Therapy

## 2022-02-04 ENCOUNTER — Encounter: Payer: Self-pay | Admitting: Physical Therapy

## 2022-02-04 DIAGNOSIS — M6281 Muscle weakness (generalized): Secondary | ICD-10-CM

## 2022-02-04 DIAGNOSIS — M5416 Radiculopathy, lumbar region: Secondary | ICD-10-CM | POA: Diagnosis not present

## 2022-02-04 DIAGNOSIS — R252 Cramp and spasm: Secondary | ICD-10-CM

## 2022-02-04 NOTE — Therapy (Signed)
OUTPATIENT PHYSICAL THERAPY THORACOLUMBAR TREATMENT   Patient Name: Andrea Marquez MRN: [id number] DOB:[birth year], 52 y.o., female Today's Date: 02/04/2022  END OF SESSION:  PT End of Session - 02/04/22 1022     Visit Number 13    Date for PT Re-Evaluation 03/09/22    Authorization Type U[employer]    PT Start Time 1018    PT Stop Time 1100    PT Time Calculation (min) 42 min    Activity Tolerance Patient tolerated treatment well    Behavior During Therapy WFL for tasks assessed/performed              Past Medical History:  Diagnosis Date   ADHD    Anxiety    Atrial tachycardia    Back pain    Depression    severe   Female infertility    GERD (gastroesophageal reflux disease)    Hyperlipidemia    Hypertension    Obesity    Palpitations    PONV (postoperative nausea and vomiting)    Prediabetes    Severe anxiety    Vertigo    Past Surgical History:  Procedure Laterality Date   AUGMENTATION MAMMAPLASTY     BREAST ENHANCEMENT SURGERY     LAPAROSCOPY     NSVD     x2   OOPHORECTOMY     Single    TOTAL VAGINAL HYSTERECTOMY     TUBAL LIGATION     Patient Active Problem List   Diagnosis Date Noted   Pain and swelling of left knee 07/23/2021   Encounter for weight management 06/20/2020   Acute stress reaction 04/16/2020   Vertigo 11/20/2019   Seasonal allergies 05/17/2019   Cold sore 04/05/2019   Atrial tachycardia 08/23/2018   Hyperhidrosis 07/18/2018   Stress incontinence 07/18/2018   Frequent urination 07/18/2018   Hematuria 10/24/2017   Attention deficit hyperactivity disorder (ADHD), combined type 65/03/5463   Synovial plica syndrome of right knee 04/29/2016   Severe episode of recurrent major depressive disorder, without psychotic features ([place]) 08/21/2015   Generalized anxiety disorder 08/21/2015   Hypertension 04/07/2015   Shingles 05/05/2012   Lumbar spondylosis 03/17/2012   Morbid obesity ([place]) 03/21/2008   INSOMNIA 06/20/2007   Depression with  anxiety 04/19/2007    PCP: Hali Marry, MD  REFERRING PROVIDER: Thane Edu [facility]  REFERRING DIAG: M54.16 (ICD-10-CM) - Radiculopathy, lumbar region  Rationale for Evaluation and Treatment: Rehabilitation  THERAPY DIAG:  Radiculopathy, lumbar region  Muscle weakness (generalized)  Cramp and spasm  ONSET DATE: March 2023  SUBJECTIVE:  SUBJECTIVE STATEMENT: Pt states she continues to feel good. Thinks her hip is a little looser with marching at home.   PERTINENT HISTORY:  Hysterectomy, anxiety, HTN  Eval: Patient reports left LE sciatica starting in March. Facet injections helped but epidural did not. She has not been able to run since pain started. Left leg is weak; stairs are getting better.  PAIN:  Are you having pain? Yes: NPRS scale: 3/10 Pain location: left low back hip to leg to back of the mid thigh Pain description: pressure low back; burn Aggravating factors: sitting, cooking and prolonged standing Relieving factors: meds a little, prone fig 4 sometimes  PRECAUTIONS: None   LIVING ENVIRONMENT: Lives with: lives with their family Lives in: House/apartment Stairs: Yes: Internal: 13 steps; can reach both and External: 8 steps; can reach both Has following equipment at home:  uses crawling if she has been active cleaning; must use rail and None  OCCUPATION: on the computer  PATIENT GOALS: get back to running, normalizing ADLS  NEXT MD VISIT: none scheduled  OBJECTIVE: (Measures in this section from initial evaluation unless otherwise noted)  DIAGNOSTIC FINDINGS:  MRI 04/14/21 - IMPRESSION: 1. Lumbar spine degeneration primarily affecting the facets and especially at L4-5 where there is anterolisthesis and active left facet arthritis. 2. Chronic and  noncompressive disc protrusions at T11-12 and L3-4.  MUSCLE LENGTH: HS: mild tighness bil Quads: ITB: Piriformis: mild tightness R Hip Flexors: WNL Heelcords:WNL   POSTURE: weight shift right and tight R QL  LUMBAR ROM:   AROM eval  Flexion full  Extension full  Right lateral flexion full  Left lateral flexion full  Right rotation full  Left rotation Full but mild tightness   (Blank rows = not tested)  LOWER EXTREMITY MMT:    MMT Right eval Left eval  Hip flexion 4+ 4+  Hip extension 5 5  Hip abduction 5 5  Hip adduction 5 5  Hip internal rotation    Hip external rotation    Knee flexion 5 5  Knee extension 5 5  Ankle dorsiflexion  5  Ankle plantarflexion    Ankle inversion  5  Ankle eversion  5   (Blank rows = not tested)  FUNCTIONAL TESTS: Squat: shifts R SL Squat: with UE support WNL  1/2 kneel to stand: must use hand to push through thigh     TODAY'S TREATMENT:                                                                                                                              02/04/22 THEREX Treadmill 2.4 mph x 5 min Sitting  Figure 4 stretch 2x 30 sec  Piriformis stretch 2 x30 sec  On green pball LAQ 2x10  On green pball marching x10 Supine  SIJ correction 5x5 sec (L LE extended, R LE flexed) Standing  Shoulder ext blue TB with marching 3x10  Wall squat + PPT 3x10 with 10# KB  Prone  Plank 2x30 sec (Forearm and feet)  Side plank 2x20 sec (forearm and knees)    02/02/22 THEREX Treadmill 2.4 mph x 5 min Supine  Laying on small towel wrapped around marker against PSIS x2 min  SIJ correction 5x5 sec (L LE extended, R LE flexed) Prone  Plank 2x25 sec Standing  Wall squat + PPT 3x10  Quad stretch x30 sec  Shoulder ext blue TB with marching 3x10 Sitting  Pball lumbar flexion x30 sec  On pball LAQ 2x10  On pball marching x10  MANUAL THERAPY LAD L hip 2x30 sec Grade II to III inferior hip mobilization and lateral  glide  01/28/22 THEREX Treadmill 2.4 mph x 5 min Sitting  Piriformis stretch 2x30 sec  Figure 4 stretch 2x30 sec  On pball: LAQ 2x10  Hip flexor stretch with arms outstretched 2x30 sec R&L Supine  Laying on small towel wrapped around marker against PSIS x2 min  SIJ correction 5x5 sec (L LE extended, R LE flexed)  Bridge with 8# weight 10x3 sec Prone  Plank 2x20 sec Standing  Wall squat + PPT 3x10   PATIENT EDUCATION:  Education details: ongoing PT POC Sitting with small towel behind L PSIS  Person educated: Patient Education method: Consulting civil engineer, Demonstration, Verbal cues, and Handouts Education comprehension: verbalized understanding and returned demonstration  HOME EXERCISE PROGRAM: Access Code: [email or web address] URL: [email or web address] Date: 01/26/2022 Prepared by: Estill Bamberg April Thurnell Garbe  Exercises - Prone Press Up  - 3-4 x daily - 7 x weekly - 1-3 sets - 10 reps - Lunge with Counter Support  - 1 x daily - 7 x weekly - 3 sets - 10 reps - Standing Lumbar Extension  - 2 x daily - 7 x weekly - 1 sets - 2-3 reps - 2-3 sec  hold - Standing Back Extension at [place]  - 2 x daily - 7 x weekly - 1 sets - 3 reps - 2-3 sec  hold - Standing Lumbar Extension with Counter  - 2 x daily - 7 x weekly - 1 sets - 3 reps - 2-3 sec  hold - Supine Transversus Abdominis Bracing with Pelvic Floor Contraction  - 2 x daily - 7 x weekly - 1 sets - 10 reps - 10sec  hold - Hooklying Sequential Leg March and Lower  - 1 x daily - 7 x weekly - 3 sets - 10 reps - Primal Push Up  - 1 x daily - 7 x weekly - 1 sets - 5 reps - max hold - Supine Piriformis Stretch with Leg Straight  - 2 x daily - 7 x weekly - 1 sets - 3 reps - 30 sec  hold - Supine Pelvic Floor Stretch  - 2 x daily - 7 x weekly - Standing Row with Resistance with Anchored Resistance at Chest Height Palms Down  - 2 x daily - 7 x weekly - 1-3 sets - 10 reps - 3 sec  hold - Shoulder extension with resistance - Neutral  - 1 x daily - 7  x weekly - 1-2 sets - 10 reps - 3-5 sec  hold - Anti-Rotation Lateral Stepping with Press  - 2 x daily - 7 x weekly - 1-2 sets - 10 reps - 2-3 sec  hold - Sit to Stand  - 2 x daily - 7 x weekly - 1 sets - 10 reps - 3-5 sec  hold - Modified Deadlift with Pelvic Contraction  - 1 x daily - 7 x weekly -  1 sets - 10 reps - Seated Flexion Stretch with Swiss Ball  - 1 x daily - 7 x weekly - 2 sets - 30 sec hold - Seated Thoracic Flexion and Rotation with Swiss Ball  - 1 x daily - 7 x weekly - 2 sets - 30 sec hold - Half Kneeling Hip Flexor Stretch with Sidebend  - 1 x daily - 7 x weekly - 2 sets - 30 sec hold - Standard Plank  - 1 x daily - 7 x weekly - 2 sets - 20 sec hold - Prone Hip Extension with Bent Knee  - 1 x daily - 7 x weekly - 2 sets - 10 reps - Supine SI Joint Self-Correction (Mirrored)  - 2 x daily - 7 x weekly - 3 reps - 5 sec hold  ASSESSMENT:  CLINICAL IMPRESSION: Pt with improving pelvic stability. Able to tolerate all double leg/symmetrical exercises without increase in burning. Working on tolerating asymmetrical/single leg activities without burning. Hip flexion past 90 deg still ilicits posterior inominate rotation but able to self correct with SIJ METs   GOALS: Goals reviewed with patient? Yes  SHORT TERM GOALS: Target date: 12/29/2021 (Remove Blue Hyperlink)  Patient will be independent with initial HEP.  Baseline:  Goal status: MET  2.  Patient will report centralization of radicular symptoms.  Baseline:  Goal status: MET    REVISED LONG TERM GOALS: Target date: 03/09/2022   Patient will be independent with advanced/ongoing HEP to improve outcomes and carryover.  Baseline:  Goal status: MET  2.  Patient will report 75% improvement in low back pain to improve QOL.  Baseline:  Goal status: IN PROGRESS  3.  Patient will be able to sit with equal WB and no increased sx.   Baseline: Mild L posterior rotation in sitting but no symptoms (12/28) Goal status: IN  PROGRESS  4.  Patient will demonstrate improved functional strength as demonstrated by ability to squat and perform floor to stand transfer on L LE without difficulty. Baseline: Still reports difficulty with sit<>stand and placing weight through L LE (12/28) Goal status: IN PROGRESS  5.   Patient will tolerate 45-60 min of standing and walking to perform normal ADLS including cooking. Baseline:  Goal status: IN PROGRESS  7.  Patient able to return to running without radicular sx into L LE.  Baseline: Currently no radicular symptoms but not yet running (12/28) Goal status: IN PROGRESS  PLAN:  PT FREQUENCY: 2x/week  PT DURATION: 6 weeks  PLANNED INTERVENTIONS: Therapeutic exercises, Therapeutic activity, Neuromuscular re-education, Balance training, Gait training, Patient/Family education, Self Care, Joint mobilization, Aquatic Therapy, Dry Needling, Electrical stimulation, Spinal mobilization, Cryotherapy, Moist heat, Taping, Traction, Ultrasound, Ionotophoresis '4mg'$ /ml Dexamethasone, and Manual therapy.  PLAN FOR NEXT SESSION: Check SIJ rotation, work on L hip hypomobility, work on core strength, functional LE strength   Coleton Woon April Ma L Latavius Capizzi, PT 02/04/2022, 10:23 AM

## 2022-02-08 ENCOUNTER — Encounter: Payer: 59 | Admitting: Physical Therapy

## 2022-02-16 ENCOUNTER — Encounter: Payer: Self-pay | Admitting: Physical Therapy

## 2022-02-16 ENCOUNTER — Ambulatory Visit: Payer: 59 | Admitting: Physical Therapy

## 2022-02-16 ENCOUNTER — Other Ambulatory Visit: Payer: Self-pay | Admitting: Family Medicine

## 2022-02-16 DIAGNOSIS — R252 Cramp and spasm: Secondary | ICD-10-CM

## 2022-02-16 DIAGNOSIS — M5416 Radiculopathy, lumbar region: Secondary | ICD-10-CM

## 2022-02-16 DIAGNOSIS — M6281 Muscle weakness (generalized): Secondary | ICD-10-CM

## 2022-02-16 MED ORDER — AMPHETAMINE-DEXTROAMPHET ER 30 MG PO CP24: 30.0000 mg | ORAL_CAPSULE | ORAL | 0 refills | Status: DC

## 2022-02-16 MED ORDER — AMPHETAMINE-DEXTROAMPHETAMINE 10 MG PO TABS: 10.0000 mg | ORAL_TABLET | Freq: Two times a day (BID) | ORAL | 0 refills | Status: DC

## 2022-02-16 NOTE — Therapy (Signed)
OUTPATIENT PHYSICAL THERAPY THORACOLUMBAR TREATMENT   Patient Name: Andrea Marquez MRN: [id number] DOB:[birth year], 52 y.o., female Today's Date: 02/16/2022  END OF SESSION:  PT End of Session - 02/16/22 0804     Visit Number 14    Date for PT Re-Evaluation 03/09/22    Authorization Type UHC    PT Start Time 0804    PT Stop Time 0845    PT Time Calculation (min) 41 min    Activity Tolerance Patient tolerated treatment well    Behavior During Therapy WFL for tasks assessed/performed               Past Medical History:  Diagnosis Date   ADHD    Anxiety    Atrial tachycardia    Back pain    Depression    severe   Female infertility    GERD (gastroesophageal reflux disease)    Hyperlipidemia    Hypertension    Obesity    Palpitations    PONV (postoperative nausea and vomiting)    Prediabetes    Severe anxiety    Vertigo    Past Surgical History:  Procedure Laterality Date   AUGMENTATION MAMMAPLASTY     BREAST ENHANCEMENT SURGERY     LAPAROSCOPY     NSVD     x2   OOPHORECTOMY     Single    TOTAL VAGINAL HYSTERECTOMY     TUBAL LIGATION     Patient Active Problem List   Diagnosis Date Noted   Pain and swelling of left knee 07/23/2021   Encounter for weight management 06/20/2020   Acute stress reaction 04/16/2020   Vertigo 11/20/2019   Seasonal allergies 05/17/2019   Cold sore 04/05/2019   Atrial tachycardia 08/23/2018   Hyperhidrosis 07/18/2018   Stress incontinence 07/18/2018   Frequent urination 07/18/2018   Hematuria 10/24/2017   Attention deficit hyperactivity disorder (ADHD), combined type 22/02/5425   Synovial plica syndrome of right knee 04/29/2016   Severe episode of recurrent major depressive disorder, without psychotic features ([place]) 08/21/2015   Generalized anxiety disorder 08/21/2015   Hypertension 04/07/2015   Shingles 05/05/2012   Lumbar spondylosis 03/17/2012   Morbid obesity ([place]) 03/21/2008   INSOMNIA 06/20/2007   Depression  with anxiety 04/19/2007    PCP: Hali Marry, MD  REFERRING PROVIDER: Thane Edu [facility]  REFERRING DIAG: M54.16 (ICD-10-CM) - Radiculopathy, lumbar region  Rationale for Evaluation and Treatment: Rehabilitation  THERAPY DIAG:  Radiculopathy, lumbar region  Muscle weakness (generalized)  Cramp and spasm  ONSET DATE: March 2023  SUBJECTIVE:  SUBJECTIVE STATEMENT: Pt states she's been in [place] so has not been able to do her exercises. Pt states things have held though -- pain has remained maintained.   PERTINENT HISTORY:  Hysterectomy, anxiety, HTN  Eval: Patient reports left LE sciatica starting in March. Facet injections helped but epidural did not. She has not been able to run since pain started. Left leg is weak; stairs are getting better.  PAIN:  Are you having pain? Yes: NPRS scale: 2/10 Pain location: left low back hip to leg to back of the mid thigh Pain description: pressure low back; burn Aggravating factors: sitting, cooking and prolonged standing Relieving factors: meds a little, prone fig 4 sometimes  PRECAUTIONS: None   LIVING ENVIRONMENT: Lives with: lives with their family Lives in: House/apartment Stairs: Yes: Internal: 13 steps; can reach both and External: 8 steps; can reach both Has following equipment at home:  uses crawling if she has been active cleaning; must use rail and None  OCCUPATION: on the computer  PATIENT GOALS: get back to running, normalizing ADLS  NEXT MD VISIT: none scheduled  OBJECTIVE: (Measures in this section from initial evaluation unless otherwise noted)  DIAGNOSTIC FINDINGS:  MRI 04/14/21 - IMPRESSION: 1. Lumbar spine degeneration primarily affecting the facets and especially at L4-5 where there is anterolisthesis and  active left facet arthritis. 2. Chronic and noncompressive disc protrusions at T11-12 and L3-4.  MUSCLE LENGTH: HS: mild tighness bil Quads: ITB: Piriformis: mild tightness R Hip Flexors: WNL Heelcords:WNL   POSTURE: weight shift right and tight R QL  LUMBAR ROM:   AROM eval  Flexion full  Extension full  Right lateral flexion full  Left lateral flexion full  Right rotation full  Left rotation Full but mild tightness   (Blank rows = not tested)  LOWER EXTREMITY MMT:    MMT Right eval Left eval  Hip flexion 4+ 4+  Hip extension 5 5  Hip abduction 5 5  Hip adduction 5 5  Hip internal rotation    Hip external rotation    Knee flexion 5 5  Knee extension 5 5  Ankle dorsiflexion  5  Ankle plantarflexion    Ankle inversion  5  Ankle eversion  5   (Blank rows = not tested)  FUNCTIONAL TESTS: Squat: shifts R SL Squat: with UE support WNL  1/2 kneel to stand: must use hand to push through thigh     TODAY'S TREATMENT:                                                                                                                              02/16/22 THEREX Treadmill 2.4 mph x 5 min grade 2 Sitting  Hip flexor stretch 2x30 sec  Piriformis stretch 2x30 sec  Sit<>stand 10# KB 2x10 with hip hinge (cues to not use momentum) Supine  Laying on small towel wrapped around marker against PSIS x2 min  SIJ correction 5x5 sec (L LE extended,  R LE flexed) Prone  Plank 2x30 sec (forearm/feet)  Side plank 2x20 sec (forearm/knees) Standing  Shoulder ext green TB with marching 3x10  Standing runner hip flexion/ext 2x10 R&L    02/04/22 THEREX Treadmill 2.4 mph x 5 min Sitting  Figure 4 stretch 2x 30 sec  Piriformis stretch 2 x30 sec  On green pball LAQ 2x10  On green pball marching x10 Supine  SIJ correction 5x5 sec (L LE extended, R LE flexed) Standing  Shoulder ext blue TB with marching 3x10  Wall squat + PPT 3x10 with 10# KB Prone  Plank 2x30 sec (Forearm and  feet)  Side plank 2x20 sec (forearm and knees)    02/02/22 THEREX Treadmill 2.4 mph x 5 min Supine  Laying on small towel wrapped around marker against PSIS x2 min  SIJ correction 5x5 sec (L LE extended, R LE flexed) Prone  Plank 2x25 sec Standing  Wall squat + PPT 3x10  Quad stretch x30 sec  Shoulder ext blue TB with marching 3x10 Sitting  Pball lumbar flexion x30 sec  On pball LAQ 2x10  On pball marching x10  MANUAL THERAPY LAD L hip 2x30 sec Grade II to III inferior hip mobilization and lateral glide   PATIENT EDUCATION:  Education details: HEP updates Person educated: Patient Education method: Explanation, Demonstration, Verbal cues, and Handouts Education comprehension: verbalized understanding and returned demonstration  HOME EXERCISE PROGRAM: Access Code: [email or web address] URL: [email or web address] Date: 01/26/2022 Prepared by: Estill Bamberg April Thurnell Garbe  Exercises - Prone Press Up  - 3-4 x daily - 7 x weekly - 1-3 sets - 10 reps - Lunge with Counter Support  - 1 x daily - 7 x weekly - 3 sets - 10 reps - Standing Lumbar Extension  - 2 x daily - 7 x weekly - 1 sets - 2-3 reps - 2-3 sec  hold - Standing Back Extension at [place]  - 2 x daily - 7 x weekly - 1 sets - 3 reps - 2-3 sec  hold - Standing Lumbar Extension with Counter  - 2 x daily - 7 x weekly - 1 sets - 3 reps - 2-3 sec  hold - Supine Transversus Abdominis Bracing with Pelvic Floor Contraction  - 2 x daily - 7 x weekly - 1 sets - 10 reps - 10sec  hold - Hooklying Sequential Leg March and Lower  - 1 x daily - 7 x weekly - 3 sets - 10 reps - Primal Push Up  - 1 x daily - 7 x weekly - 1 sets - 5 reps - max hold - Supine Piriformis Stretch with Leg Straight  - 2 x daily - 7 x weekly - 1 sets - 3 reps - 30 sec  hold - Supine Pelvic Floor Stretch  - 2 x daily - 7 x weekly - Standing Row with Resistance with Anchored Resistance at Chest Height Palms Down  - 2 x daily - 7 x weekly - 1-3 sets - 10 reps - 3 sec   hold - Shoulder extension with resistance - Neutral  - 1 x daily - 7 x weekly - 1-2 sets - 10 reps - 3-5 sec  hold - Anti-Rotation Lateral Stepping with Press  - 2 x daily - 7 x weekly - 1-2 sets - 10 reps - 2-3 sec  hold - Sit to Stand  - 2 x daily - 7 x weekly - 1 sets - 10 reps - 3-5 sec  hold - Modified Deadlift with Pelvic  Contraction  - 1 x daily - 7 x weekly - 1 sets - 10 reps - Seated Flexion Stretch with Swiss Ball  - 1 x daily - 7 x weekly - 2 sets - 30 sec hold - Seated Thoracic Flexion and Rotation with Swiss Ball  - 1 x daily - 7 x weekly - 2 sets - 30 sec hold - Half Kneeling Hip Flexor Stretch with Sidebend  - 1 x daily - 7 x weekly - 2 sets - 30 sec hold - Standard Plank  - 1 x daily - 7 x weekly - 2 sets - 20 sec hold - Prone Hip Extension with Bent Knee  - 1 x daily - 7 x weekly - 2 sets - 10 reps - Supine SI Joint Self-Correction (Mirrored)  - 2 x daily - 7 x weekly - 3 reps - 5 sec hold  ASSESSMENT:  CLINICAL IMPRESSION: Pt able to maintain gains despite break in exercises with her travel to [place]. Worked on continuing to improve core and hip strength for pelvic stability. Pt tolerating more single leg stabilization exercises with less SI burning.    GOALS: Goals reviewed with patient? Yes  SHORT TERM GOALS: Target date: 12/29/2021 (Remove Blue Hyperlink)  Patient will be independent with initial HEP.  Baseline:  Goal status: MET  2.  Patient will report centralization of radicular symptoms.  Baseline:  Goal status: MET    REVISED LONG TERM GOALS: Target date: 03/09/2022   Patient will be independent with advanced/ongoing HEP to improve outcomes and carryover.  Baseline:  Goal status: MET  2.  Patient will report 75% improvement in low back pain to improve QOL.  Baseline:  Goal status: IN PROGRESS  3.  Patient will be able to sit with equal WB and no increased sx.   Baseline: Mild L posterior rotation in sitting but no symptoms (12/28) Goal status:  IN PROGRESS  4.  Patient will demonstrate improved functional strength as demonstrated by ability to squat and perform floor to stand transfer on L LE without difficulty. Baseline: Still reports difficulty with sit<>stand and placing weight through L LE (12/28) Goal status: IN PROGRESS  5.   Patient will tolerate 45-60 min of standing and walking to perform normal ADLS including cooking. Baseline:  Goal status: IN PROGRESS  7.  Patient able to return to running without radicular sx into L LE.  Baseline: Currently no radicular symptoms but not yet running (12/28) Goal status: IN PROGRESS  PLAN:  PT FREQUENCY: 2x/week  PT DURATION: 6 weeks  PLANNED INTERVENTIONS: Therapeutic exercises, Therapeutic activity, Neuromuscular re-education, Balance training, Gait training, Patient/Family education, Self Care, Joint mobilization, Aquatic Therapy, Dry Needling, Electrical stimulation, Spinal mobilization, Cryotherapy, Moist heat, Taping, Traction, Ultrasound, Ionotophoresis '4mg'$ /ml Dexamethasone, and Manual therapy.  PLAN FOR NEXT SESSION: Check SIJ rotation, work on L hip hypomobility, work on core strength, functional LE strength   Arlee Santosuosso April Ma L Brandyn Thien, PT 02/16/2022, 8:04 AM

## 2022-02-18 ENCOUNTER — Encounter: Payer: 59 | Admitting: Physical Therapy

## 2022-02-25 ENCOUNTER — Ambulatory Visit: Payer: 59 | Attending: Neurological Surgery | Admitting: Physical Therapy

## 2022-02-25 ENCOUNTER — Encounter: Payer: Self-pay | Admitting: Physical Therapy

## 2022-02-25 DIAGNOSIS — R252 Cramp and spasm: Secondary | ICD-10-CM | POA: Diagnosis present

## 2022-02-25 DIAGNOSIS — M6281 Muscle weakness (generalized): Secondary | ICD-10-CM | POA: Diagnosis present

## 2022-02-25 DIAGNOSIS — M5416 Radiculopathy, lumbar region: Secondary | ICD-10-CM | POA: Diagnosis present

## 2022-02-25 NOTE — Therapy (Signed)
OUTPATIENT PHYSICAL THERAPY THORACOLUMBAR TREATMENT   Patient Name: Andrea Marquez MRN: [id number] DOB:1970-08-10, 52 y.o., female Today's Date: 02/25/2022  END OF SESSION:  PT End of Session - 02/25/22 0932     Visit Number 15    Date for PT Re-Evaluation 03/09/22    Authorization Type U[employer]    PT Start Time 0932    PT Stop Time 0174    PT Time Calculation (min) 43 min    Activity Tolerance Patient tolerated treatment well    Behavior During Therapy WFL for tasks assessed/performed               Past Medical History:  Diagnosis Date   ADHD    Anxiety    Atrial tachycardia    Back pain    Depression    severe   Female infertility    GERD (gastroesophageal reflux disease)    Hyperlipidemia    Hypertension    Obesity    Palpitations    PONV (postoperative nausea and vomiting)    Prediabetes    Severe anxiety    Vertigo    Past Surgical History:  Procedure Laterality Date   AUGMENTATION MAMMAPLASTY     BREAST ENHANCEMENT SURGERY     LAPAROSCOPY     NSVD     x2   OOPHORECTOMY     Single    TOTAL VAGINAL HYSTERECTOMY     TUBAL LIGATION     Patient Active Problem List   Diagnosis Date Noted   Pain and swelling of left knee 07/23/2021   Encounter for weight management 06/20/2020   Acute stress reaction 04/16/2020   Vertigo 11/20/2019   Seasonal allergies 05/17/2019   Cold sore 04/05/2019   Atrial tachycardia 08/23/2018   Hyperhidrosis 07/18/2018   Stress incontinence 07/18/2018   Frequent urination 07/18/2018   Hematuria 10/24/2017   Attention deficit hyperactivity disorder (ADHD), combined type 94/49/6759   Synovial plica syndrome of right knee 04/29/2016   Severe episode of recurrent major depressive disorder, without psychotic features ([place]) 08/21/2015   Generalized anxiety disorder 08/21/2015   Hypertension 04/07/2015   Shingles 05/05/2012   Lumbar spondylosis 03/17/2012   Morbid obesity ([place]) 03/21/2008   INSOMNIA 06/20/2007   Depression  with anxiety 04/19/2007    PCP: Hali Marry, MD  REFERRING PROVIDER: Thane Edu [facility]  REFERRING DIAG: M54.16 (ICD-10-CM) - Radiculopathy, lumbar region  Rationale for Evaluation and Treatment: Rehabilitation  THERAPY DIAG:  Radiculopathy, lumbar region  Muscle weakness (generalized)  Cramp and spasm  ONSET DATE: March 2023  SUBJECTIVE:  SUBJECTIVE STATEMENT: "I've been really good." States she's been able to walk a lot more. Pain remains well maintained with her stretches and SIJ correction.  PERTINENT HISTORY:  Hysterectomy, anxiety, HTN  Eval: Patient reports left LE sciatica starting in March. Facet injections helped but epidural did not. She has not been able to run since pain started. Left leg is weak; stairs are getting better.  PAIN:  Are you having pain? Yes: NPRS scale: 0/10 Pain location: left low back hip to leg to back of the mid thigh Pain description: pressure low back; burn Aggravating factors: sitting, cooking and prolonged standing Relieving factors: meds a little, prone fig 4 sometimes  PRECAUTIONS: None   LIVING ENVIRONMENT: Lives with: lives with their family Lives in: House/apartment Stairs: Yes: Internal: 13 steps; can reach both and External: 8 steps; can reach both Has following equipment at home:  uses crawling if she has been active cleaning; must use rail and None  OCCUPATION: on the computer  PATIENT GOALS: get back to running, normalizing ADLS  NEXT MD VISIT: none scheduled  OBJECTIVE: (Measures in this section from initial evaluation unless otherwise noted)  DIAGNOSTIC FINDINGS:  MRI 04/14/21 - IMPRESSION: 1. Lumbar spine degeneration primarily affecting the facets and especially at L4-5 where there is anterolisthesis and active  left facet arthritis. 2. Chronic and noncompressive disc protrusions at T11-12 and L3-4.  MUSCLE LENGTH: HS: mild tighness bil Quads: ITB: Piriformis: mild tightness R Hip Flexors: WNL Heelcords:WNL   POSTURE: weight shift right and tight R QL  LUMBAR ROM:   AROM eval  Flexion full  Extension full  Right lateral flexion full  Left lateral flexion full  Right rotation full  Left rotation Full but mild tightness   (Blank rows = not tested)  LOWER EXTREMITY MMT:    MMT Right eval Left eval  Hip flexion 4+ 4+  Hip extension 5 5  Hip abduction 5 5  Hip adduction 5 5  Hip internal rotation    Hip external rotation    Knee flexion 5 5  Knee extension 5 5  Ankle dorsiflexion  5  Ankle plantarflexion    Ankle inversion  5  Ankle eversion  5   (Blank rows = not tested)  FUNCTIONAL TESTS: Squat: shifts R SL Squat: with UE support WNL  1/2 kneel to stand: must use hand to push through thigh     TODAY'S TREATMENT:                                                                                                                              02/25/22 THEREX Treadmill 2.4 mph x 5 min grade 2 Hip flexor stretch 2x30 sec SIJ correction 5x5 sec (L LE extended, R LE flexed) Standing runner's hip flexion/ext 2x10 Hip flex with heel raises hands against wall in runner's form 3x10 Sit<>stand into jumps 3x10 Plank 2x30 sec (forearm/feet) Side plank 2x25 sec (forearm/knees)   02/16/22 THEREX Treadmill  2.4 mph x 5 min grade 2 Sitting  Hip flexor stretch 2x30 sec  Piriformis stretch 2x30 sec  Sit<>stand 10# KB 2x10 with hip hinge (cues to not use momentum) Supine  Laying on small towel wrapped around marker against PSIS x2 min  SIJ correction 5x5 sec (L LE extended, R LE flexed) Prone  Plank 2x30 sec (forearm/feet)  Side plank 2x20 sec (forearm/knees) Standing  Shoulder ext green TB with marching 3x10  Standing runner hip flexion/ext 2x10 R&L     PATIENT EDUCATION:   Education details: HEP updates Person educated: Patient Education method: Explanation, Demonstration, Verbal cues, and Handouts Education comprehension: verbalized understanding and returned demonstration  HOME EXERCISE PROGRAM: Access Code: [email or web address] URL: [email or web address] Date: 01/26/2022 Prepared by: Estill Bamberg April Thurnell Garbe  Exercises - Prone Press Up  - 3-4 x daily - 7 x weekly - 1-3 sets - 10 reps - Lunge with Counter Support  - 1 x daily - 7 x weekly - 3 sets - 10 reps - Standing Lumbar Extension  - 2 x daily - 7 x weekly - 1 sets - 2-3 reps - 2-3 sec  hold - Standing Back Extension at [place]  - 2 x daily - 7 x weekly - 1 sets - 3 reps - 2-3 sec  hold - Standing Lumbar Extension with Counter  - 2 x daily - 7 x weekly - 1 sets - 3 reps - 2-3 sec  hold - Supine Transversus Abdominis Bracing with Pelvic Floor Contraction  - 2 x daily - 7 x weekly - 1 sets - 10 reps - 10sec  hold - Hooklying Sequential Leg March and Lower  - 1 x daily - 7 x weekly - 3 sets - 10 reps - Primal Push Up  - 1 x daily - 7 x weekly - 1 sets - 5 reps - max hold - Supine Piriformis Stretch with Leg Straight  - 2 x daily - 7 x weekly - 1 sets - 3 reps - 30 sec  hold - Supine Pelvic Floor Stretch  - 2 x daily - 7 x weekly - Standing Row with Resistance with Anchored Resistance at Chest Height Palms Down  - 2 x daily - 7 x weekly - 1-3 sets - 10 reps - 3 sec  hold - Shoulder extension with resistance - Neutral  - 1 x daily - 7 x weekly - 1-2 sets - 10 reps - 3-5 sec  hold - Anti-Rotation Lateral Stepping with Press  - 2 x daily - 7 x weekly - 1-2 sets - 10 reps - 2-3 sec  hold - Sit to Stand  - 2 x daily - 7 x weekly - 1 sets - 10 reps - 3-5 sec  hold - Modified Deadlift with Pelvic Contraction  - 1 x daily - 7 x weekly - 1 sets - 10 reps - Seated Flexion Stretch with Swiss Ball  - 1 x daily - 7 x weekly - 2 sets - 30 sec hold - Seated Thoracic Flexion and Rotation with Swiss Ball  - 1 x daily -  7 x weekly - 2 sets - 30 sec hold - Half Kneeling Hip Flexor Stretch with Sidebend  - 1 x daily - 7 x weekly - 2 sets - 30 sec hold - Standard Plank  - 1 x daily - 7 x weekly - 2 sets - 20 sec hold - Prone Hip Extension with Bent Knee  - 1 x daily - 7 x weekly -  2 sets - 10 reps - Supine SI Joint Self-Correction (Mirrored)  - 2 x daily - 7 x weekly - 3 reps - 5 sec hold  ASSESSMENT:  CLINICAL IMPRESSION: Pt has been able to maintain gains well with stretches and SIJ corrections. Continued to progress pt with strengthening and plyometrics for running. Tolerated well with 0 pain by end of session.   GOALS: Goals reviewed with patient? Yes  SHORT TERM GOALS: Target date: 12/29/2021 (Remove Blue Hyperlink)  Patient will be independent with initial HEP.  Baseline:  Goal status: MET  2.  Patient will report centralization of radicular symptoms.  Baseline:  Goal status: MET    REVISED LONG TERM GOALS: Target date: 03/09/2022   Patient will be independent with advanced/ongoing HEP to improve outcomes and carryover.  Baseline:  Goal status: MET  2.  Patient will report 75% improvement in low back pain to improve QOL.  Baseline:  Goal status: IN PROGRESS  3.  Patient will be able to sit with equal WB and no increased sx.   Baseline: Mild L posterior rotation in sitting but no symptoms (12/28) Goal status: IN PROGRESS  4.  Patient will demonstrate improved functional strength as demonstrated by ability to squat and perform floor to stand transfer on L LE without difficulty. Baseline: Still reports difficulty with sit<>stand and placing weight through L LE (12/28) Goal status: IN PROGRESS  5.   Patient will tolerate 45-60 min of standing and walking to perform normal ADLS including cooking. Baseline:  Goal status: IN PROGRESS  7.  Patient able to return to running without radicular sx into L LE.  Baseline: Currently no radicular symptoms but not yet running (12/28) Goal status:  IN PROGRESS  PLAN:  PT FREQUENCY: 2x/week  PT DURATION: 6 weeks  PLANNED INTERVENTIONS: Therapeutic exercises, Therapeutic activity, Neuromuscular re-education, Balance training, Gait training, Patient/Family education, Self Care, Joint mobilization, Aquatic Therapy, Dry Needling, Electrical stimulation, Spinal mobilization, Cryotherapy, Moist heat, Taping, Traction, Ultrasound, Ionotophoresis '4mg'$ /ml Dexamethasone, and Manual therapy.  PLAN FOR NEXT SESSION: Check SIJ rotation, work on L hip hypomobility, work on core strength, functional LE strength   Jasleen Riepe April Ma L Colton Tassin, PT 02/25/2022, 9:33 AM

## 2022-03-04 ENCOUNTER — Encounter: Payer: Self-pay | Admitting: Physical Therapy

## 2022-03-04 ENCOUNTER — Ambulatory Visit: Payer: 59 | Admitting: Physical Therapy

## 2022-03-04 DIAGNOSIS — M5416 Radiculopathy, lumbar region: Secondary | ICD-10-CM | POA: Diagnosis not present

## 2022-03-04 DIAGNOSIS — M6281 Muscle weakness (generalized): Secondary | ICD-10-CM

## 2022-03-04 DIAGNOSIS — R252 Cramp and spasm: Secondary | ICD-10-CM

## 2022-03-04 NOTE — Therapy (Signed)
OUTPATIENT PHYSICAL THERAPY THORACOLUMBAR TREATMENT   Patient Name: Andrea Marquez MRN: [id number] DOB:07-Nov-1970, 52 y.o., female Today's Date: 03/04/2022  END OF SESSION:  PT End of Session - 03/04/22 0842     Visit Number 16    Date for PT Re-Evaluation 03/09/22    Authorization Type UHC    PT Start Time 0845    PT Stop Time 0930    PT Time Calculation (min) 45 min    Activity Tolerance Patient tolerated treatment well    Behavior During Therapy WFL for tasks assessed/performed               Past Medical History:  Diagnosis Date   ADHD    Anxiety    Atrial tachycardia    Back pain    Depression    severe   Female infertility    GERD (gastroesophageal reflux disease)    Hyperlipidemia    Hypertension    Obesity    Palpitations    PONV (postoperative nausea and vomiting)    Prediabetes    Severe anxiety    Vertigo    Past Surgical History:  Procedure Laterality Date   AUGMENTATION MAMMAPLASTY     BREAST ENHANCEMENT SURGERY     LAPAROSCOPY     NSVD     x2   OOPHORECTOMY     Single    TOTAL VAGINAL HYSTERECTOMY     TUBAL LIGATION     Patient Active Problem List   Diagnosis Date Noted   Pain and swelling of left knee 07/23/2021   Encounter for weight management 06/20/2020   Acute stress reaction 04/16/2020   Vertigo 11/20/2019   Seasonal allergies 05/17/2019   Cold sore 04/05/2019   Atrial tachycardia 08/23/2018   Hyperhidrosis 07/18/2018   Stress incontinence 07/18/2018   Frequent urination 07/18/2018   Hematuria 10/24/2017   Attention deficit hyperactivity disorder (ADHD), combined type 46/96/2952   Synovial plica syndrome of right knee 04/29/2016   Severe episode of recurrent major depressive disorder, without psychotic features ([place]) 08/21/2015   Generalized anxiety disorder 08/21/2015   Hypertension 04/07/2015   Shingles 05/05/2012   Lumbar spondylosis 03/17/2012   Morbid obesity ([place]) 03/21/2008   INSOMNIA 06/20/2007   Depression  with anxiety 04/19/2007    PCP: Hali Marry, MD  REFERRING PROVIDER: Thane Edu [facility]  REFERRING DIAG: M54.16 (ICD-10-CM) - Radiculopathy, lumbar region  Rationale for Evaluation and Treatment: Rehabilitation  THERAPY DIAG:  Radiculopathy, lumbar region  Muscle weakness (generalized)  Cramp and spasm  ONSET DATE: March 2023  SUBJECTIVE:  SUBJECTIVE STATEMENT: Pt states she has tried to walk a little faster on increased incline -- can note that it stiffens up quickly and she has to stretch. Other exercises have been going okay.   PERTINENT HISTORY:  Hysterectomy, anxiety, HTN  Eval: Patient reports left LE sciatica starting in March. Facet injections helped but epidural did not. She has not been able to run since pain started. Left leg is weak; stairs are getting better.  PAIN:  Are you having pain? Yes: NPRS scale: 0/10 Pain location: left low back hip to leg to back of the mid thigh Pain description: pressure low back; burn Aggravating factors: sitting, cooking and prolonged standing Relieving factors: meds a little, prone fig 4 sometimes  PRECAUTIONS: None   LIVING ENVIRONMENT: Lives with: lives with their family Lives in: House/apartment Stairs: Yes: Internal: 13 steps; can reach both and External: 8 steps; can reach both Has following equipment at home:  uses crawling if she has been active cleaning; must use rail and None  OCCUPATION: on the computer  PATIENT GOALS: get back to running, normalizing ADLS  NEXT MD VISIT: none scheduled  OBJECTIVE: (Measures in this section from initial evaluation unless otherwise noted)  DIAGNOSTIC FINDINGS:  MRI 04/14/21 - IMPRESSION: 1. Lumbar spine degeneration primarily affecting the facets and especially at L4-5 where  there is anterolisthesis and active left facet arthritis. 2. Chronic and noncompressive disc protrusions at T11-12 and L3-4.  MUSCLE LENGTH: HS: mild tighness bil Quads: ITB: Piriformis: mild tightness R Hip Flexors: WNL Heelcords:WNL   POSTURE: weight shift right and tight R QL  LUMBAR ROM:   AROM eval  Flexion full  Extension full  Right lateral flexion full  Left lateral flexion full  Right rotation full  Left rotation Full but mild tightness   (Blank rows = not tested)  LOWER EXTREMITY MMT:    MMT Right eval Left eval  Hip flexion 4+ 4+  Hip extension 5 5  Hip abduction 5 5  Hip adduction 5 5  Hip internal rotation    Hip external rotation    Knee flexion 5 5  Knee extension 5 5  Ankle dorsiflexion  5  Ankle plantarflexion    Ankle inversion  5  Ankle eversion  5   (Blank rows = not tested)  FUNCTIONAL TESTS: Squat: shifts R SL Squat: with UE support WNL  1/2 kneel to stand: must use hand to push through thigh     TODAY'S TREATMENT:                                                                                                                              [facility] Adult PT Treatment:  DATE: 03/04/22 Therapeutic Exercise: Treadmill 2.4 mph x 5 min grade 2 SIJ correction 5x5 sec (L LE extended, R LE flexed) Standing runner's hip flexion/ext x10, 2x10 with 5# KB Lateral hip shift to L 5x5 sec Forward plank 2x30 sec Hip flex with heel raises hands against wall in runner's form 3x10 Runner's lunge 2x15' Lunge 2x15'   02/25/22 THEREX Treadmill 2.4 mph x 5 min grade 2 Hip flexor stretch 2x30 sec SIJ correction 5x5 sec (L LE extended, R LE flexed) Standing runner's hip flexion/ext 2x10 Hip flex with heel raises hands against wall in runner's form 3x10 Sit<>stand into jumps 3x10 Plank 2x30 sec (forearm/feet) Side plank 2x25 sec (forearm/knees)   02/16/22 THEREX Treadmill 2.4 mph x 5 min grade 2 Sitting  Hip  flexor stretch 2x30 sec  Piriformis stretch 2x30 sec  Sit<>stand 10# KB 2x10 with hip hinge (cues to not use momentum) Supine  Laying on small towel wrapped around marker against PSIS x2 min  SIJ correction 5x5 sec (L LE extended, R LE flexed) Prone  Plank 2x30 sec (forearm/feet)  Side plank 2x20 sec (forearm/knees) Standing  Shoulder ext green TB with marching 3x10  Standing runner hip flexion/ext 2x10 R&L     PATIENT EDUCATION:  Education details: HEP updates Person educated: Patient Education method: Explanation, Demonstration, Verbal cues, and Handouts Education comprehension: verbalized understanding and returned demonstration  HOME EXERCISE PROGRAM: Access Code: [email or web address] URL: [email or web address] Date: 01/26/2022 Prepared by: Estill Bamberg April Thurnell Garbe  Exercises - Prone Press Up  - 3-4 x daily - 7 x weekly - 1-3 sets - 10 reps - Lunge with Counter Support  - 1 x daily - 7 x weekly - 3 sets - 10 reps - Standing Lumbar Extension  - 2 x daily - 7 x weekly - 1 sets - 2-3 reps - 2-3 sec  hold - Standing Back Extension at [place]  - 2 x daily - 7 x weekly - 1 sets - 3 reps - 2-3 sec  hold - Standing Lumbar Extension with Counter  - 2 x daily - 7 x weekly - 1 sets - 3 reps - 2-3 sec  hold - Supine Transversus Abdominis Bracing with Pelvic Floor Contraction  - 2 x daily - 7 x weekly - 1 sets - 10 reps - 10sec  hold - Hooklying Sequential Leg March and Lower  - 1 x daily - 7 x weekly - 3 sets - 10 reps - Primal Push Up  - 1 x daily - 7 x weekly - 1 sets - 5 reps - max hold - Supine Piriformis Stretch with Leg Straight  - 2 x daily - 7 x weekly - 1 sets - 3 reps - 30 sec  hold - Supine Pelvic Floor Stretch  - 2 x daily - 7 x weekly - Standing Row with Resistance with Anchored Resistance at Chest Height Palms Down  - 2 x daily - 7 x weekly - 1-3 sets - 10 reps - 3 sec  hold - Shoulder extension with resistance - Neutral  - 1 x daily - 7 x weekly - 1-2 sets - 10 reps -  3-5 sec  hold - Anti-Rotation Lateral Stepping with Press  - 2 x daily - 7 x weekly - 1-2 sets - 10 reps - 2-3 sec  hold - Sit to Stand  - 2 x daily - 7 x weekly - 1 sets - 10 reps - 3-5 sec  hold - Modified Deadlift with Pelvic Contraction  -  1 x daily - 7 x weekly - 1 sets - 10 reps - Seated Flexion Stretch with Swiss Ball  - 1 x daily - 7 x weekly - 2 sets - 30 sec hold - Seated Thoracic Flexion and Rotation with Swiss Ball  - 1 x daily - 7 x weekly - 2 sets - 30 sec hold - Half Kneeling Hip Flexor Stretch with Sidebend  - 1 x daily - 7 x weekly - 2 sets - 30 sec hold - Standard Plank  - 1 x daily - 7 x weekly - 2 sets - 20 sec hold - Prone Hip Extension with Bent Knee  - 1 x daily - 7 x weekly - 2 sets - 10 reps - Supine SI Joint Self-Correction (Mirrored)  - 2 x daily - 7 x weekly - 3 reps - 5 sec hold  ASSESSMENT:  CLINICAL IMPRESSION: Pt's hips slightly out of alignment this session -- L hip posteriorly rotated and with slight lateral hip shift. Able to correct by end of session. Continued to work on single leg stability without getting hips out of alignment.    GOALS: Goals reviewed with patient? Yes  SHORT TERM GOALS: Target date: 12/29/2021 (Remove Blue Hyperlink)  Patient will be independent with initial HEP.  Baseline:  Goal status: MET  2.  Patient will report centralization of radicular symptoms.  Baseline:  Goal status: MET    REVISED LONG TERM GOALS: Target date: 03/09/2022   Patient will be independent with advanced/ongoing HEP to improve outcomes and carryover.  Baseline:  Goal status: MET  2.  Patient will report 75% improvement in low back pain to improve QOL.  Baseline:  Goal status: IN PROGRESS -- 60-70% improvement 03/04/22  3.  Patient will be able to sit with equal WB and no increased sx.   Baseline: Mild L posterior rotation in sitting but no symptoms (12/28) Goal status: IN PROGRESS -- able to sit ~1 hr 03/04/22  4.  Patient will demonstrate  improved functional strength as demonstrated by ability to squat and perform floor to stand transfer on L LE without difficulty. Baseline: Still reports difficulty with sit<>stand and placing weight through L LE (12/28) Goal status: MET    5.   Patient will tolerate 45-60 min of standing and walking to perform normal ADLS including cooking. Baseline:  Goal status: IN PROGRESS -- 45 min standing 03/04/22  7.  Patient able to return to running without radicular sx into L LE.  Baseline: Currently no radicular symptoms but not yet running (12/28) Goal status: IN PROGRESS -- still progressing to running  PLAN:  PT FREQUENCY: 2x/week  PT DURATION: 6 weeks  PLANNED INTERVENTIONS: Therapeutic exercises, Therapeutic activity, Neuromuscular re-education, Balance training, Gait training, Patient/Family education, Self Care, Joint mobilization, Aquatic Therapy, Dry Needling, Electrical stimulation, Spinal mobilization, Cryotherapy, Moist heat, Taping, Traction, Ultrasound, Ionotophoresis '4mg'$ /ml Dexamethasone, and Manual therapy.  PLAN FOR NEXT SESSION: Check SIJ rotation, work on core strength, functional LE strength   Anik Wesch April Ma L Dasan Hardman, PT 03/04/2022, 8:42 AM

## 2022-03-16 ENCOUNTER — Ambulatory Visit: Payer: 59 | Admitting: Physical Therapy

## 2022-03-16 ENCOUNTER — Encounter: Payer: Self-pay | Admitting: Physical Therapy

## 2022-03-16 DIAGNOSIS — M5416 Radiculopathy, lumbar region: Secondary | ICD-10-CM | POA: Diagnosis not present

## 2022-03-16 DIAGNOSIS — M6281 Muscle weakness (generalized): Secondary | ICD-10-CM

## 2022-03-16 DIAGNOSIS — R252 Cramp and spasm: Secondary | ICD-10-CM

## 2022-03-16 NOTE — Therapy (Signed)
OUTPATIENT PHYSICAL THERAPY THORACOLUMBAR TREATMENT   Patient Name: Andrea Marquez MRN: [id number] DOB:1970-09-21, 52 y.o., female Today's Date: 03/16/2022  END OF SESSION:  PT End of Session - 03/16/22 1012     Visit Number 17    Date for PT Re-Evaluation 05/11/22    Authorization Type UHC    PT Start Time [id number]    PT Stop Time 1055    PT Time Calculation (min) 40 min    Activity Tolerance Patient tolerated treatment well    Behavior During Therapy WFL for tasks assessed/performed              Past Medical History:  Diagnosis Date   ADHD    Anxiety    Atrial tachycardia    Back pain    Depression    severe   Female infertility    GERD (gastroesophageal reflux disease)    Hyperlipidemia    Hypertension    Obesity    Palpitations    PONV (postoperative nausea and vomiting)    Prediabetes    Severe anxiety    Vertigo    Past Surgical History:  Procedure Laterality Date   AUGMENTATION MAMMAPLASTY     BREAST ENHANCEMENT SURGERY     LAPAROSCOPY     NSVD     x2   OOPHORECTOMY     Single    TOTAL VAGINAL HYSTERECTOMY     TUBAL LIGATION     Patient Active Problem List   Diagnosis Date Noted   Pain and swelling of left knee 07/23/2021   Encounter for weight management 06/20/2020   Acute stress reaction 04/16/2020   Vertigo 11/20/2019   Seasonal allergies 05/17/2019   Cold sore 04/05/2019   Atrial tachycardia 08/23/2018   Hyperhidrosis 07/18/2018   Stress incontinence 07/18/2018   Frequent urination 07/18/2018   Hematuria 10/24/2017   Attention deficit hyperactivity disorder (ADHD), combined type [id number]   Synovial plica syndrome of right knee 04/29/2016   Severe episode of recurrent major depressive disorder, without psychotic features ([place]) 08/21/2015   Generalized anxiety disorder 08/21/2015   Hypertension 04/07/2015   Shingles 05/05/2012   Lumbar spondylosis 03/17/2012   Morbid obesity ([place]) 03/21/2008   INSOMNIA 06/20/2007   Depression with  anxiety 04/19/2007    PCP: Hali Marry, MD  REFERRING PROVIDER: Thane Edu [facility]  REFERRING DIAG: M54.16 (ICD-10-CM) - Radiculopathy, lumbar region  Rationale for Evaluation and Treatment: Rehabilitation  THERAPY DIAG:  Radiculopathy, lumbar region  Muscle weakness (generalized)  Cramp and spasm  ONSET DATE: March 2023  SUBJECTIVE:  SUBJECTIVE STATEMENT: Pt states she has been able to get 45 min on the treadmill. Able to put incline of 3 with 3.0 mph x 30 min. Was able to walk ~1 hour when in [place]. Back was feeling pretty good throughout.   PERTINENT HISTORY:  Hysterectomy, anxiety, HTN  Eval: Patient reports left LE sciatica starting in March. Facet injections helped but epidural did not. She has not been able to run since pain started. Left leg is weak; stairs are getting better.  PAIN:  Are you having pain? Yes: NPRS scale: 0/10 Pain location: left low back hip to leg to back of the mid thigh Pain description: pressure low back; burn Aggravating factors: sitting, cooking and prolonged standing Relieving factors: meds a little, prone fig 4 sometimes  PRECAUTIONS: None   LIVING ENVIRONMENT: Lives with: lives with their family Lives in: House/apartment Stairs: Yes: Internal: 13 steps; can reach both and External: 8 steps; can reach both Has following equipment at home:  uses crawling if she has been active cleaning; must use rail and None  OCCUPATION: on the computer  PATIENT GOALS: get back to running, normalizing ADLS  NEXT MD VISIT: none scheduled  OBJECTIVE: (Measures in this section from initial evaluation unless otherwise noted)  DIAGNOSTIC FINDINGS:  MRI 04/14/21 - IMPRESSION: 1. Lumbar spine degeneration primarily affecting the facets and especially at  L4-5 where there is anterolisthesis and active left facet arthritis. 2. Chronic and noncompressive disc protrusions at T11-12 and L3-4.  MUSCLE LENGTH: HS: mild tighness bil Piriformis: mild tightness R Hip Flexors: WNL Heelcords:WNL   POSTURE: weight shift right and tight R QL  LUMBAR ROM:   AROM eval  Flexion full  Extension full  Right lateral flexion full  Left lateral flexion full  Right rotation full  Left rotation Full but mild tightness   (Blank rows = not tested)  LOWER EXTREMITY MMT:    MMT Right eval Left eval  Hip flexion 4+ 4+  Hip extension 5 5  Hip abduction 5 5  Hip adduction 5 5  Hip internal rotation    Hip external rotation    Knee flexion 5 5  Knee extension 5 5  Ankle dorsiflexion  5  Ankle plantarflexion    Ankle inversion  5  Ankle eversion  5   (Blank rows = not tested)  FUNCTIONAL TESTS: Squat: shifts R SL Squat: with UE support WNL  1/2 kneel to stand: must use hand to push through thigh     TODAY'S TREATMENT:                                                                                                                              [facility] Adult PT Treatment:  DATE: 03/16/22 Therapeutic Exercise: Treadmill 2.4 mph x 5 min grade 2 Standing runner's hip flexion/ext x10 Standing fire hydrant red TB 2x10 Walking lunges 2x10 Forward lunge with L foot forward only with RTB for hip abd 2x10 Marching with heel raise 2x10 Forward plank 2x30 sec Side plank 2x30 sec R&L Sit<>stand 3x10 with mini jumps   [facility] Adult PT Treatment:                                                DATE: 03/04/22 Therapeutic Exercise: Treadmill 2.4 mph x 5 min grade 2 SIJ correction 5x5 sec (L LE extended, R LE flexed) Standing runner's hip flexion/ext x10, 2x10 with 5# KB Lateral hip shift to L 5x5 sec Forward plank 2x30 sec Hip flex with heel raises hands against wall in runner's form 3x10 Runner's lunge  2x15' Lunge 2x15'   PATIENT EDUCATION:  Education details: HEP updates Person educated: Patient Education method: Explanation, Demonstration, Verbal cues, and Handouts Education comprehension: verbalized understanding and returned demonstration  HOME EXERCISE PROGRAM: Access Code: [email or web address] URL: [email or web address] Date: 01/26/2022 Prepared by: Estill Bamberg April Thurnell Garbe  Exercises - Prone Press Up  - 3-4 x daily - 7 x weekly - 1-3 sets - 10 reps - Lunge with Counter Support  - 1 x daily - 7 x weekly - 3 sets - 10 reps - Standing Lumbar Extension  - 2 x daily - 7 x weekly - 1 sets - 2-3 reps - 2-3 sec  hold - Standing Back Extension at [place]  - 2 x daily - 7 x weekly - 1 sets - 3 reps - 2-3 sec  hold - Standing Lumbar Extension with Counter  - 2 x daily - 7 x weekly - 1 sets - 3 reps - 2-3 sec  hold - Supine Transversus Abdominis Bracing with Pelvic Floor Contraction  - 2 x daily - 7 x weekly - 1 sets - 10 reps - 10sec  hold - Hooklying Sequential Leg March and Lower  - 1 x daily - 7 x weekly - 3 sets - 10 reps - Primal Push Up  - 1 x daily - 7 x weekly - 1 sets - 5 reps - max hold - Supine Piriformis Stretch with Leg Straight  - 2 x daily - 7 x weekly - 1 sets - 3 reps - 30 sec  hold - Supine Pelvic Floor Stretch  - 2 x daily - 7 x weekly - Standing Row with Resistance with Anchored Resistance at Chest Height Palms Down  - 2 x daily - 7 x weekly - 1-3 sets - 10 reps - 3 sec  hold - Shoulder extension with resistance - Neutral  - 1 x daily - 7 x weekly - 1-2 sets - 10 reps - 3-5 sec  hold - Anti-Rotation Lateral Stepping with Press  - 2 x daily - 7 x weekly - 1-2 sets - 10 reps - 2-3 sec  hold - Sit to Stand  - 2 x daily - 7 x weekly - 1 sets - 10 reps - 3-5 sec  hold - Modified Deadlift with Pelvic Contraction  - 1 x daily - 7 x weekly - 1 sets - 10 reps - Seated Flexion Stretch with Swiss Ball  - 1 x daily - 7 x weekly - 2 sets - 30 sec hold -  Seated Thoracic Flexion  and Rotation with Swiss Ball  - 1 x daily - 7 x weekly - 2 sets - 30 sec hold - Half Kneeling Hip Flexor Stretch with Sidebend  - 1 x daily - 7 x weekly - 2 sets - 30 sec hold - Standard Plank  - 1 x daily - 7 x weekly - 2 sets - 20 sec hold - Prone Hip Extension with Bent Knee  - 1 x daily - 7 x weekly - 2 sets - 10 reps - Supine SI Joint Self-Correction (Mirrored)  - 2 x daily - 7 x weekly - 3 reps - 5 sec hold  ASSESSMENT:  CLINICAL IMPRESSION: Pt continues to have difficulties with runner's lunge at home -- able to do walking lunges but noted instability when L foot is forward. Cued to activate more glute med in standing and during lunges with good correction. Continued to add some plyometrics to ready pt for more running positions. Able to self correct when SIJ is out of alignment. Pt would benefit from continued PT to reach her goals to return to running.    GOALS: Goals reviewed with patient? Yes  SHORT TERM GOALS: Target date: 12/29/2021 (Remove Blue Hyperlink)  Patient will be independent with initial HEP.  Baseline:  Goal status: MET  2.  Patient will report centralization of radicular symptoms.  Baseline:  Goal status: MET    REVISED LONG TERM GOALS: Target date: 05/11/2022   Patient will be independent with advanced/ongoing HEP to improve outcomes and carryover.  Baseline:  Goal status: MET  2.  Patient will report 75% improvement in low back pain to improve QOL.  Baseline: 60-70% improvement 03/04/22 Goal status: IN PROGRESS  3.  Patient will be able to sit with equal WB and no increased sx.   Baseline: Mild L posterior rotation in sitting but no symptoms (12/28) Goal status: MET -- able to sit ~1 hr 03/04/22  4.  Patient will demonstrate improved functional strength as demonstrated by ability to squat and perform floor to stand transfer on L LE without difficulty. Baseline: Still reports difficulty with sit<>stand and placing weight through L LE (12/28) Goal  status: MET    5.   Patient will tolerate 45-60 min of standing and walking to perform normal ADLS including cooking. Baseline: See eval 45 min standing 03/04/22 Goal status: IN PROGRESS   7.  Patient able to return to running without radicular sx into L LE.  Baseline: Currently no radicular symptoms but not yet running (12/28) Fast walking x 30 - 45 min 03/16/22 Goal status: IN PROGRESS  PLAN:  PT FREQUENCY: every other week  PT DURATION: 8 weeks  PLANNED INTERVENTIONS: Therapeutic exercises, Therapeutic activity, Neuromuscular re-education, Balance training, Gait training, Patient/Family education, Self Care, Joint mobilization, Aquatic Therapy, Dry Needling, Electrical stimulation, Spinal mobilization, Cryotherapy, Moist heat, Taping, Traction, Ultrasound, Ionotophoresis 41m/ml Dexamethasone, and Manual therapy.  PLAN FOR NEXT SESSION: Check SIJ rotation, work on core strength, functional LE strength   Yasemin Rabon April Ma L Asmara Backs, PT 03/16/2022, 11:00 AM

## 2022-03-23 ENCOUNTER — Ambulatory Visit: Payer: 59 | Admitting: Physical Therapy

## 2022-03-23 ENCOUNTER — Encounter: Payer: Self-pay | Admitting: Physical Therapy

## 2022-03-23 DIAGNOSIS — M5416 Radiculopathy, lumbar region: Secondary | ICD-10-CM | POA: Diagnosis not present

## 2022-03-23 DIAGNOSIS — M6281 Muscle weakness (generalized): Secondary | ICD-10-CM

## 2022-03-23 DIAGNOSIS — R252 Cramp and spasm: Secondary | ICD-10-CM

## 2022-03-23 NOTE — Therapy (Signed)
OUTPATIENT PHYSICAL THERAPY THORACOLUMBAR TREATMENT   Patient Name: CORAZON BONO MRN: [id number] DOB:August 18, 1970, 52 y.o., female Today's Date: 03/23/2022  END OF SESSION:  PT End of Session - 03/23/22 1017     Visit Number 18    Date for PT Re-Evaluation 05/11/22    Authorization Type UHC    PT Start Time 1017    PT Stop Time 1100    PT Time Calculation (min) 43 min    Activity Tolerance Patient tolerated treatment well    Behavior During Therapy WFL for tasks assessed/performed               Past Medical History:  Diagnosis Date   ADHD    Anxiety    Atrial tachycardia    Back pain    Depression    severe   Female infertility    GERD (gastroesophageal reflux disease)    Hyperlipidemia    Hypertension    Obesity    Palpitations    PONV (postoperative nausea and vomiting)    Prediabetes    Severe anxiety    Vertigo    Past Surgical History:  Procedure Laterality Date   AUGMENTATION MAMMAPLASTY     BREAST ENHANCEMENT SURGERY     LAPAROSCOPY     NSVD     x2   OOPHORECTOMY     Single    TOTAL VAGINAL HYSTERECTOMY     TUBAL LIGATION     Patient Active Problem List   Diagnosis Date Noted   Pain and swelling of left knee 07/23/2021   Encounter for weight management 06/20/2020   Acute stress reaction 04/16/2020   Vertigo 11/20/2019   Seasonal allergies 05/17/2019   Cold sore 04/05/2019   Atrial tachycardia 08/23/2018   Hyperhidrosis 07/18/2018   Stress incontinence 07/18/2018   Frequent urination 07/18/2018   Hematuria 10/24/2017   Attention deficit hyperactivity disorder (ADHD), combined type [id number]   Synovial plica syndrome of right knee 04/29/2016   Severe episode of recurrent major depressive disorder, without psychotic features ([place]) 08/21/2015   Generalized anxiety disorder 08/21/2015   Hypertension 04/07/2015   Shingles 05/05/2012   Lumbar spondylosis 03/17/2012   Morbid obesity ([place]) 03/21/2008   INSOMNIA 06/20/2007   Depression  with anxiety 04/19/2007    PCP: Hali Marry, MD  REFERRING PROVIDER: Thane Edu [facility]  REFERRING DIAG: M54.16 (ICD-10-CM) - Radiculopathy, lumbar region  Rationale for Evaluation and Treatment: Rehabilitation  THERAPY DIAG:  Radiculopathy, lumbar region  Muscle weakness (generalized)  Cramp and spasm  ONSET DATE: March 2023  SUBJECTIVE:  SUBJECTIVE STATEMENT: "Everything has been going great."   PERTINENT HISTORY:  Hysterectomy, anxiety, HTN  Eval: Patient reports left LE sciatica starting in March. Facet injections helped but epidural did not. She has not been able to run since pain started. Left leg is weak; stairs are getting better.  PAIN:  Are you having pain? Yes: NPRS scale: 0/10 Pain location: left low back hip to leg to back of the mid thigh Pain description: pressure low back; burn Aggravating factors: sitting, cooking and prolonged standing Relieving factors: meds a little, prone fig 4 sometimes  PRECAUTIONS: None   LIVING ENVIRONMENT: Lives with: lives with their family Lives in: House/apartment Stairs: Yes: Internal: 13 steps; can reach both and External: 8 steps; can reach both Has following equipment at home:  uses crawling if she has been active cleaning; must use rail and None  OCCUPATION: on the computer  PATIENT GOALS: get back to running, normalizing ADLS  NEXT MD VISIT: none scheduled  OBJECTIVE: (Measures in this section from initial evaluation unless otherwise noted)  DIAGNOSTIC FINDINGS:  MRI 04/14/21 - IMPRESSION: 1. Lumbar spine degeneration primarily affecting the facets and especially at L4-5 where there is anterolisthesis and active left facet arthritis. 2. Chronic and noncompressive disc protrusions at T11-12 and L3-4.  MUSCLE  LENGTH: HS: mild tighness bil Piriformis: mild tightness R Hip Flexors: WNL Heelcords:WNL   POSTURE: weight shift right and tight R QL  LUMBAR ROM:   AROM eval  Flexion full  Extension full  Right lateral flexion full  Left lateral flexion full  Right rotation full  Left rotation Full but mild tightness   (Blank rows = not tested)  LOWER EXTREMITY MMT:    MMT Right eval Left eval  Hip flexion 4+ 4+  Hip extension 5 5  Hip abduction 5 5  Hip adduction 5 5  Hip internal rotation    Hip external rotation    Knee flexion 5 5  Knee extension 5 5  Ankle dorsiflexion  5  Ankle plantarflexion    Ankle inversion  5  Ankle eversion  5   (Blank rows = not tested)  FUNCTIONAL TESTS: Squat: shifts R SL Squat: with UE support WNL  1/2 kneel to stand: must use hand to push through thigh     TODAY'S TREATMENT:                                                                                                                              [facility] Adult PT Treatment:                                                DATE: 03/23/22 Therapeutic Exercise: Treadmill 2.6 mph x 5 min grade 2 Standing fire hydrant red TB 3x10 Marching + heel raise + skip 3x10 Sit<>stand + mini  jumps 3x10 Step tap plyo on 4" step 3x30 sec Forward plank 2x30 sec Side plank 2x30 sec Forward walking lunges 2x10   [facility] Adult PT Treatment:                                                DATE: 03/16/22 Therapeutic Exercise: Treadmill 2.4 mph x 5 min grade 2 Standing runner's hip flexion/ext x10 Standing fire hydrant red TB 2x10 Walking lunges 2x10 Forward lunge with L foot forward only with RTB for hip abd 2x10 Marching with heel raise 2x10 Forward plank 2x30 sec Side plank 2x30 sec R&L Sit<>stand 3x10 with mini jumps   [facility] Adult PT Treatment:                                                DATE: 03/04/22 Therapeutic Exercise: Treadmill 2.4 mph x 5 min grade 2 SIJ correction 5x5 sec (L LE extended, R LE  flexed) Standing runner's hip flexion/ext x10, 2x10 with 5# KB Lateral hip shift to L 5x5 sec Forward plank 2x30 sec Hip flex with heel raises hands against wall in runner's form 3x10 Runner's lunge 2x15' Lunge 2x15'   PATIENT EDUCATION:  Education details: HEP updates Person educated: Patient Education method: Explanation, Demonstration, Verbal cues, and Handouts Education comprehension: verbalized understanding and returned demonstration  HOME EXERCISE PROGRAM: Access Code: [email or web address] URL: [email or web address] Date: 01/26/2022 Prepared by: Estill Bamberg April Thurnell Garbe  Exercises - Prone Press Up  - 3-4 x daily - 7 x weekly - 1-3 sets - 10 reps - Lunge with Counter Support  - 1 x daily - 7 x weekly - 3 sets - 10 reps - Standing Lumbar Extension  - 2 x daily - 7 x weekly - 1 sets - 2-3 reps - 2-3 sec  hold - Standing Back Extension at [place]  - 2 x daily - 7 x weekly - 1 sets - 3 reps - 2-3 sec  hold - Standing Lumbar Extension with Counter  - 2 x daily - 7 x weekly - 1 sets - 3 reps - 2-3 sec  hold - Supine Transversus Abdominis Bracing with Pelvic Floor Contraction  - 2 x daily - 7 x weekly - 1 sets - 10 reps - 10sec  hold - Hooklying Sequential Leg March and Lower  - 1 x daily - 7 x weekly - 3 sets - 10 reps - Primal Push Up  - 1 x daily - 7 x weekly - 1 sets - 5 reps - max hold - Supine Piriformis Stretch with Leg Straight  - 2 x daily - 7 x weekly - 1 sets - 3 reps - 30 sec  hold - Supine Pelvic Floor Stretch  - 2 x daily - 7 x weekly - Standing Row with Resistance with Anchored Resistance at Chest Height Palms Down  - 2 x daily - 7 x weekly - 1-3 sets - 10 reps - 3 sec  hold - Shoulder extension with resistance - Neutral  - 1 x daily - 7 x weekly - 1-2 sets - 10 reps - 3-5 sec  hold - Anti-Rotation Lateral Stepping with Press  - 2 x daily - 7 x weekly - 1-2 sets - 10  reps - 2-3 sec  hold - Sit to Stand  - 2 x daily - 7 x weekly - 1 sets - 10 reps - 3-5 sec  hold -  Modified Deadlift with Pelvic Contraction  - 1 x daily - 7 x weekly - 1 sets - 10 reps - Seated Flexion Stretch with Swiss Ball  - 1 x daily - 7 x weekly - 2 sets - 30 sec hold - Seated Thoracic Flexion and Rotation with Swiss Ball  - 1 x daily - 7 x weekly - 2 sets - 30 sec hold - Half Kneeling Hip Flexor Stretch with Sidebend  - 1 x daily - 7 x weekly - 2 sets - 30 sec hold - Standard Plank  - 1 x daily - 7 x weekly - 2 sets - 20 sec hold - Prone Hip Extension with Bent Knee  - 1 x daily - 7 x weekly - 2 sets - 10 reps - Supine SI Joint Self-Correction (Mirrored)  - 2 x daily - 7 x weekly - 3 reps - 5 sec hold  ASSESSMENT:  CLINICAL IMPRESSION: Added more plyometrics to pt's HEP to prep for running. Improving form and hip abductor stability noted. Pt continues to progress well towards goals with no pain.   GOALS: Goals reviewed with patient? Yes  SHORT TERM GOALS: Target date: 12/29/2021 (Remove Blue Hyperlink)  Patient will be independent with initial HEP.  Baseline:  Goal status: MET  2.  Patient will report centralization of radicular symptoms.  Baseline:  Goal status: MET    REVISED LONG TERM GOALS: Target date: 05/11/2022   Patient will be independent with advanced/ongoing HEP to improve outcomes and carryover.  Baseline:  Goal status: MET  2.  Patient will report 75% improvement in low back pain to improve QOL.  Baseline: 60-70% improvement 03/04/22 Goal status: IN PROGRESS  3.  Patient will be able to sit with equal WB and no increased sx.   Baseline: Mild L posterior rotation in sitting but no symptoms (12/28) Goal status: MET -- able to sit ~1 hr 03/04/22  4.  Patient will demonstrate improved functional strength as demonstrated by ability to squat and perform floor to stand transfer on L LE without difficulty. Baseline: Still reports difficulty with sit<>stand and placing weight through L LE (12/28) Goal status: MET    5.   Patient will tolerate 45-60 min of  standing and walking to perform normal ADLS including cooking. Baseline: See eval 45 min standing 03/04/22 Goal status: IN PROGRESS   7.  Patient able to return to running without radicular sx into L LE.  Baseline: Currently no radicular symptoms but not yet running (12/28) Fast walking x 30 - 45 min 03/16/22 Goal status: IN PROGRESS  PLAN:  PT FREQUENCY: every other week  PT DURATION: 8 weeks  PLANNED INTERVENTIONS: Therapeutic exercises, Therapeutic activity, Neuromuscular re-education, Balance training, Gait training, Patient/Family education, Self Care, Joint mobilization, Aquatic Therapy, Dry Needling, Electrical stimulation, Spinal mobilization, Cryotherapy, Moist heat, Taping, Traction, Ultrasound, Ionotophoresis '4mg'$ /ml Dexamethasone, and Manual therapy.  PLAN FOR NEXT SESSION: Check SIJ rotation, work on core strength, functional LE strength   Ritu Gagliardo April Ma L Ovadia Lopp, PT 03/23/2022, 10:17 AM

## 2022-03-29 ENCOUNTER — Other Ambulatory Visit: Payer: Self-pay | Admitting: Family Medicine

## 2022-03-30 ENCOUNTER — Encounter: Payer: 59 | Admitting: Physical Therapy

## 2022-03-30 MED ORDER — AMPHETAMINE-DEXTROAMPHET ER 30 MG PO CP24: 30.0000 mg | ORAL_CAPSULE | ORAL | 0 refills | Status: DC

## 2022-03-30 MED ORDER — AMPHETAMINE-DEXTROAMPHETAMINE 10 MG PO TABS: 10.0000 mg | ORAL_TABLET | Freq: Two times a day (BID) | ORAL | 0 refills | Status: DC

## 2022-03-30 NOTE — Telephone Encounter (Signed)
Pt is scheduled for March 25th

## 2022-03-30 NOTE — Telephone Encounter (Signed)
Meds filled for 30 days. Needs to schedule a f/u appt since has been 6 mo   Meds ordered this encounter  Medications   amphetamine-dextroamphetamine (ADDERALL) 10 MG tablet    Sig: Take 1 tablet (10 mg total) by mouth 2 (two) times daily.    Dispense:  30 tablet    Refill:  0   amphetamine-dextroamphetamine (ADDERALL XR) 30 MG 24 hr capsule    Sig: Take 1 capsule (30 mg total) by mouth every morning.    Dispense:  30 capsule    Refill:  0

## 2022-04-08 ENCOUNTER — Encounter: Payer: 59 | Admitting: Physical Therapy

## 2022-04-19 ENCOUNTER — Ambulatory Visit: Payer: 59 | Admitting: Family Medicine

## 2022-04-19 ENCOUNTER — Encounter: Payer: Self-pay | Admitting: Family Medicine

## 2022-04-19 VITALS — BP 115/69 | HR 78 | Ht 68.0 in | Wt 243.0 lb

## 2022-04-19 DIAGNOSIS — G4733 Obstructive sleep apnea (adult) (pediatric): Secondary | ICD-10-CM | POA: Insufficient documentation

## 2022-04-19 DIAGNOSIS — E7849 Other hyperlipidemia: Secondary | ICD-10-CM

## 2022-04-19 DIAGNOSIS — F902 Attention-deficit hyperactivity disorder, combined type: Secondary | ICD-10-CM

## 2022-04-19 DIAGNOSIS — I10 Essential (primary) hypertension: Secondary | ICD-10-CM

## 2022-04-19 MED ORDER — AMPHETAMINE-DEXTROAMPHET ER 30 MG PO CP24: 30.0000 mg | ORAL_CAPSULE | ORAL | 0 refills | Status: DC

## 2022-04-19 NOTE — Assessment & Plan Note (Signed)
CPAP started 11/2021, set to 9 cm water pressure. Followed by Neurology

## 2022-04-19 NOTE — Progress Notes (Signed)
Established Patient Office Visit  Subjective   Patient ID: Andrea Marquez, female    DOB: August 03, 1970  Age: 52 y.o. MRN: [id number]  Chief Complaint  Patient presents with   Medication Refill         HPI   ADD - Reports symptoms are well controlled on current regime. Denies any problems with insomnia, chest pain, palpitations, or SOB.  She is having some difficulty getting her 30 mg Adderall XR from the pharmacy because of the backorder issues.  She was able to get the 10 mg short acting recently.  She is been doing physical therapy for the last couple of months for her hip and back.  She is doing much better overall and is just try to be careful with certain activities she still going to PT every 2 weeks for maintenance.  Management-she lost a fair amount of weight right before the holidays but has plateaued since then.  She was never able to fill the [facility].  And for now she is just can hold off to work and take it off the med list.  She is still down about 8 pounds since I last saw her which is fantastic.  And now that she is feeling a little bit better she is going to get back into the gym.  Cardiology had recommended further evaluation for obstructive sleep apnea-she was recently started on CPAP back in November and says she is actually been doing really well she has been wearing it consistently every night for at least 4 hours.  She is using the nasal pillows and she is found good relief.  Her app is showing her that she is doing well with no excessive leaks or apneas.    ROS    Objective:     BP 115/69   Pulse 78   Ht 5\' 8"  (1.727 m)   Wt 243 lb (110.2 kg)   SpO2 99%   BMI 36.95 kg/m    Physical Exam Vitals and nursing note reviewed.  Constitutional:      Appearance: She is well-developed.  HENT:     Head: Normocephalic and atraumatic.  Cardiovascular:     Rate and Rhythm: Normal rate and regular rhythm.     Heart sounds: Normal heart sounds.  Pulmonary:      Effort: Pulmonary effort is normal.     Breath sounds: Normal breath sounds.  Skin:    General: Skin is warm and dry.  Neurological:     Mental Status: She is alert and oriented to person, place, and time.  Psychiatric:        Behavior: Behavior normal.      No results found for any visits on 04/19/22.    The 10-year ASCVD risk score (Arnett DK, et al., 2019) is: 1.5%    Assessment & Plan:   Problem List Items Addressed This Visit       Cardiovascular and Mediastinum   Hypertension    Well controlled. Continue current regimen. Follow up in  9mo         Respiratory   OSA (obstructive sleep apnea)    CPAP started 11/2021, set to 9 cm water pressure. Followed by Neurology         Other   Attention deficit hyperactivity disorder (ADHD), combined type - Primary   Relevant Medications   amphetamine-dextroamphetamine (ADDERALL XR) 30 MG 24 hr capsule   amphetamine-dextroamphetamine (ADDERALL XR) 30 MG 24 hr capsule (Start on 06/15/2022)   Other  Visit Diagnoses     Essential hypertension       Relevant Orders   Lipid Panel w/reflex Direct LDL   COMPLETE METABOLIC PANEL WITH GFR   CBC   Other hyperlipidemia       Relevant Orders   Lipid Panel w/reflex Direct LDL   COMPLETE METABOLIC PANEL WITH GFR   CBC       Return in about 6 months (around 10/20/2022) for ADD.    Beatrice Lecher, MD

## 2022-04-19 NOTE — Assessment & Plan Note (Signed)
Well controlled. Continue current regimen. Follow up in  6 mo  

## 2022-04-22 ENCOUNTER — Ambulatory Visit: Payer: 59 | Attending: Neurological Surgery | Admitting: Physical Therapy

## 2022-04-22 ENCOUNTER — Encounter: Payer: Self-pay | Admitting: Physical Therapy

## 2022-04-22 DIAGNOSIS — M5416 Radiculopathy, lumbar region: Secondary | ICD-10-CM | POA: Insufficient documentation

## 2022-04-22 DIAGNOSIS — R252 Cramp and spasm: Secondary | ICD-10-CM | POA: Insufficient documentation

## 2022-04-22 DIAGNOSIS — M6281 Muscle weakness (generalized): Secondary | ICD-10-CM | POA: Diagnosis present

## 2022-04-22 NOTE — Therapy (Addendum)
OUTPATIENT PHYSICAL THERAPY THORACOLUMBAR TREATMENT AND DISCHARGE  PHYSICAL THERAPY DISCHARGE SUMMARY  Visits from Start of Care: 19  Current functional level related to goals / functional outcomes: See below   Remaining deficits: See below   Education / Equipment: See below   Patient agrees to discharge. Patient goals were partially met. Patient is being discharged due to being pleased with the current functional level.    Patient Name: Andrea Marquez MRN: [id number] DOB:10-21-1970, 52 y.o., female Today's Date: 04/22/2022  END OF SESSION:  PT End of Session - 04/22/22 1019     Visit Number 19    Date for PT Re-Evaluation 05/11/22    Authorization Type [employer]    PT Start Time 1015    PT Stop Time 1100    PT Time Calculation (min) 45 min    Activity Tolerance Patient tolerated treatment well    Behavior During Therapy WFL for tasks assessed/performed                Past Medical History:  Diagnosis Date   ADHD    Anxiety    Atrial tachycardia    Back pain    Depression    severe   Female infertility    GERD (gastroesophageal reflux disease)    Hyperlipidemia    Hypertension    Obesity    Palpitations    PONV (postoperative nausea and vomiting)    Prediabetes    Severe anxiety    Vertigo    Past Surgical History:  Procedure Laterality Date   AUGMENTATION MAMMAPLASTY     BREAST ENHANCEMENT SURGERY     LAPAROSCOPY     NSVD     x2   OOPHORECTOMY     Single    TOTAL VAGINAL HYSTERECTOMY     TUBAL LIGATION     Patient Active Problem List   Diagnosis Date Noted   OSA (obstructive sleep apnea) 04/19/2022   Pain and swelling of left knee 07/23/2021   Encounter for weight management 06/20/2020   Acute stress reaction 04/16/2020   Vertigo 11/20/2019   Seasonal allergies 05/17/2019   Cold sore 04/05/2019   Atrial tachycardia 08/23/2018   Hyperhidrosis 07/18/2018   Stress incontinence 07/18/2018   Frequent urination 07/18/2018   Hematuria  10/24/2017   Attention deficit hyperactivity disorder (ADHD), combined type 10/07/2017   Synovial plica syndrome of right knee 04/29/2016   Severe episode of recurrent major depressive disorder, without psychotic features (HCC) 08/21/2015   Generalized anxiety disorder 08/21/2015   Hypertension 04/07/2015   Shingles 05/05/2012   Lumbar spondylosis 03/17/2012   Morbid obesity (HCC) 03/21/2008   INSOMNIA 06/20/2007   Depression with anxiety 04/19/2007    PCP: Agapito Games, MD  REFERRING PROVIDER: Kae Heller [facility]  REFERRING DIAG: M54.16 (ICD-10-CM) - Radiculopathy, lumbar region  Rationale for Evaluation and Treatment: Rehabilitation  THERAPY DIAG:  Radiculopathy, lumbar region  Muscle weakness (generalized)  Cramp and spasm  ONSET DATE: March 2023  SUBJECTIVE:  SUBJECTIVE STATEMENT: Pt states she has been doing a lot of lifting and cleaning out of her house so has not had as much time to do all of her exercises the past 3 weeks. Has done some stretching. Is feeling it some on her L SIJ.   PERTINENT HISTORY:  Hysterectomy, anxiety, HTN  Eval: Patient reports left LE sciatica starting in March. Facet injections helped but epidural did not. She has not been able to run since pain started. Left leg is weak; stairs are getting better.  PAIN:  Are you having pain? Yes: NPRS scale: 3 or 4/10 Pain location: left low back hip to leg to back of the mid thigh Pain description: pressure low back; burn Aggravating factors: sitting, cooking and prolonged standing Relieving factors: meds a little, prone fig 4 sometimes  PRECAUTIONS: None   LIVING ENVIRONMENT: Lives with: lives with their family Lives in: House/apartment Stairs: Yes: Internal: 13 steps; can reach both and External: 8  steps; can reach both Has following equipment at home:  uses crawling if she has been active cleaning; must use rail and None  OCCUPATION: on the computer  PATIENT GOALS: get back to running, normalizing ADLS  NEXT MD VISIT: none scheduled  OBJECTIVE: (Measures in this section from initial evaluation unless otherwise noted)  DIAGNOSTIC FINDINGS:  MRI 04/14/21 - IMPRESSION: 1. Lumbar spine degeneration primarily affecting the facets and especially at L4-5 where there is anterolisthesis and active left facet arthritis. 2. Chronic and noncompressive disc protrusions at T11-12 and L3-4.  MUSCLE LENGTH: HS: mild tighness bil Piriformis: mild tightness R Hip Flexors: WNL Heelcords:WNL   POSTURE: weight shift right and tight R QL  LUMBAR ROM:   AROM eval  Flexion full  Extension full  Right lateral flexion full  Left lateral flexion full  Right rotation full  Left rotation Full but mild tightness   (Blank rows = not tested)  LOWER EXTREMITY MMT:    MMT Right eval Left eval  Hip flexion 4+ 4+  Hip extension 5 5  Hip abduction 5 5  Hip adduction 5 5  Hip internal rotation    Hip external rotation    Knee flexion 5 5  Knee extension 5 5  Ankle dorsiflexion  5  Ankle plantarflexion    Ankle inversion  5  Ankle eversion  5   (Blank rows = not tested)  FUNCTIONAL TESTS: Squat: shifts R SL Squat: with UE support WNL  1/2 kneel to stand: must use hand to push through thigh     TODAY'S TREATMENT:       [facility] Adult PT Treatment:                                                DATE: 04/22/22 Therapeutic Exercise: Treadmill 2.4 mph x 5 min grade 0 SIJ correction 5x5 sec Seated Figure 4 stretch x 30 sec Piriformis stretch x 30 sec Hamstring stretch x 30 sec Forward plank 2x30 sec Side plank x30 sec (forearm/knees), x30 sec with hip abd Standing fire hydrant red TB 3x10 Mountain climbers 3x30 sec Step taps 6" step 3x30 sec  [facility] Adult PT Treatment:                                                DATE: 03/23/22 Therapeutic Exercise: Treadmill 2.6 mph x 5 min grade 2 Standing fire hydrant red TB 3x10 Marching + heel raise + skip 3x10 Sit<>stand + mini jumps 3x10 Step tap plyo on 4" step 3x30 sec Forward plank 2x30 sec Side plank 2x30 sec Forward walking lunges 2x10   [facility] Adult PT Treatment:                                                DATE: 03/16/22 Therapeutic Exercise: Treadmill 2.4 mph x 5 min grade 2 Standing runner's hip flexion/ext x10 Standing fire hydrant red TB 2x10 Walking lunges 2x10 Forward lunge with L foot forward only with RTB for hip abd 2x10 Marching with heel raise 2x10 Forward plank 2x30 sec Side plank 2x30 sec R&L Sit<>stand 3x10 with mini jumps   [facility] Adult PT Treatment:                                                DATE: 03/04/22 Therapeutic Exercise: Treadmill 2.4 mph x 5 min grade 2 SIJ correction 5x5 sec (L LE extended, R LE flexed) Standing runner's hip flexion/ext x10, 2x10 with 5# KB Lateral hip shift to L 5x5 sec Forward plank 2x30 sec Hip flex with heel raises hands against wall in runner's form 3x10 Runner's lunge 2x15' Lunge 2x15'   PATIENT EDUCATION:  Education details: HEP updates Person educated: Patient Education method: Explanation, Demonstration, Verbal cues, and Handouts Education comprehension: verbalized understanding and returned demonstration  HOME EXERCISE PROGRAM: Access Code: [email or web address] URL: [email or web address] Date: 01/26/2022 Prepared by: Vernon Prey April Kirstie Peri  Exercises - Prone Press Up  - 3-4 x daily - 7 x weekly - 1-3 sets - 10 reps - Lunge with Counter Support  - 1 x daily - 7 x weekly - 3 sets - 10 reps - Standing Lumbar Extension  - 2 x daily - 7 x weekly - 1 sets - 2-3 reps - 2-3 sec  hold - Standing Back Extension at Wall  - 2 x daily -  7 x weekly - 1 sets - 3 reps - 2-3 sec  hold - Standing Lumbar Extension with Counter  - 2 x daily - 7 x weekly - 1 sets - 3 reps - 2-3 sec  hold - Supine Transversus Abdominis Bracing with Pelvic Floor Contraction  - 2 x daily - 7 x weekly - 1 sets - 10 reps - 10sec  hold - Hooklying Sequential Leg March and Lower  - 1 x daily - 7 x weekly - 3 sets - 10 reps - Primal Push Up  - 1 x daily - 7 x weekly - 1 sets - 5 reps - max hold - Supine Piriformis Stretch with Leg Straight  - 2 x daily - 7 x weekly - 1 sets - 3 reps - 30 sec  hold - Supine Pelvic Floor Stretch  - 2 x daily - 7 x weekly -  Standing Row with Resistance with Anchored Resistance at Chest Height Palms Down  - 2 x daily - 7 x weekly - 1-3 sets - 10 reps - 3 sec  hold - Shoulder extension with resistance - Neutral  - 1 x daily - 7 x weekly - 1-2 sets - 10 reps - 3-5 sec  hold - Anti-Rotation Lateral Stepping with Press  - 2 x daily - 7 x weekly - 1-2 sets - 10 reps - 2-3 sec  hold - Sit to Stand  - 2 x daily - 7 x weekly - 1 sets - 10 reps - 3-5 sec  hold - Modified Deadlift with Pelvic Contraction  - 1 x daily - 7 x weekly - 1 sets - 10 reps - Seated Flexion Stretch with Swiss Ball  - 1 x daily - 7 x weekly - 2 sets - 30 sec hold - Seated Thoracic Flexion and Rotation with Swiss Ball  - 1 x daily - 7 x weekly - 2 sets - 30 sec hold - Half Kneeling Hip Flexor Stretch with Sidebend  - 1 x daily - 7 x weekly - 2 sets - 30 sec hold - Standard Plank  - 1 x daily - 7 x weekly - 2 sets - 20 sec hold - Prone Hip Extension with Bent Knee  - 1 x daily - 7 x weekly - 2 sets - 10 reps - Supine SI Joint Self-Correction (Mirrored)  - 2 x daily - 7 x weekly - 3 reps - 5 sec hold  ASSESSMENT:  CLINICAL IMPRESSION: Pt continues to do well. Slightly rotated in L SIJ today; however, returns to good alignment after performing SIJ corrections. Continued to work on plyo, hip strengthening and core strengthening. She continues to tolerate these  progressions well. Discussed with pt to begin jogging at intervals and to call office PRN before end of her POC on 05/11/22. She continues to meet all of her goals except her running goal.    GOALS: Goals reviewed with patient? Yes  SHORT TERM GOALS: Target date: 12/29/2021 (Remove Blue Hyperlink)  Patient will be independent with initial HEP.  Baseline:  Goal status: MET  2.  Patient will report centralization of radicular symptoms.  Baseline:  Goal status: MET    REVISED LONG TERM GOALS: Target date: 05/11/2022   Patient will be independent with advanced/ongoing HEP to improve outcomes and carryover.  Baseline:  Goal status: MET  2.  Patient will report 75% improvement in low back pain to improve QOL.  Baseline: 60-70% improvement 03/04/22 90% 04/22/22 Goal status: MET  3.  Patient will be able to sit with equal WB and no increased sx.   Baseline: Mild L posterior rotation in sitting but no symptoms (12/28) Goal status: MET -- able to sit ~1 hr 03/04/22  4.  Patient will demonstrate improved functional strength as demonstrated by ability to squat and perform floor to stand transfer on L LE without difficulty. Baseline: Still reports difficulty with sit<>stand and placing weight through L LE (12/28) Goal status: MET    5.   Patient will tolerate 45-60 min of standing and walking to perform normal ADLS including cooking. Baseline: See eval 45 min standing 03/04/22 Goal status: MET   7.  Patient able to return to running without radicular sx into L LE.  Baseline: Currently no radicular symptoms but not yet running (12/28) Fast walking x 30 - 45 min 03/16/22 Goal status: IN PROGRESS  PLAN:  PT FREQUENCY:  every other week  PT DURATION: 8 weeks  PLANNED INTERVENTIONS: Therapeutic exercises, Therapeutic activity, Neuromuscular re-education, Balance training, Gait training, Patient/Family education, Self Care, Joint mobilization, Aquatic Therapy, Dry Needling, Electrical  stimulation, Spinal mobilization, Cryotherapy, Moist heat, Taping, Traction, Ultrasound, Ionotophoresis 4mg /ml Dexamethasone, and Manual therapy.  PLAN FOR NEXT SESSION: Check SIJ rotation, work on core strength, functional LE strength   Daisey Caloca April Ma L Naomy Esham, PT 04/22/2022, 10:19 AM

## 2022-05-20 ENCOUNTER — Other Ambulatory Visit: Payer: Self-pay | Admitting: Family Medicine

## 2022-05-20 DIAGNOSIS — F902 Attention-deficit hyperactivity disorder, combined type: Secondary | ICD-10-CM

## 2022-05-20 MED ORDER — AMPHETAMINE-DEXTROAMPHET ER 30 MG PO CP24: 30.0000 mg | ORAL_CAPSULE | ORAL | 0 refills | Status: DC

## 2022-05-20 MED ORDER — AMPHETAMINE-DEXTROAMPHETAMINE 10 MG PO TABS: 10.0000 mg | ORAL_TABLET | Freq: Two times a day (BID) | ORAL | 0 refills | Status: DC

## 2022-05-20 NOTE — Telephone Encounter (Signed)
Last fill 04/19/22 last visit 04/19/22

## 2022-05-20 NOTE — Telephone Encounter (Signed)
Meds ordered this encounter  Medications   amphetamine-dextroamphetamine (ADDERALL) 10 MG tablet    Sig: Take 1 tablet (10 mg total) by mouth 2 (two) times daily.    Dispense:  30 tablet    Refill:  0   amphetamine-dextroamphetamine (ADDERALL XR) 30 MG 24 hr capsule    Sig: Take 1 capsule (30 mg total) by mouth every morning.    Dispense:  30 capsule    Refill:  0

## 2022-06-22 ENCOUNTER — Other Ambulatory Visit: Payer: Self-pay | Admitting: Family Medicine

## 2022-06-22 DIAGNOSIS — F902 Attention-deficit hyperactivity disorder, combined type: Secondary | ICD-10-CM

## 2022-06-23 NOTE — Telephone Encounter (Signed)
Message sent to patient via Mychart that they should already [facility] [place] written on 06/15/2022

## 2022-06-24 ENCOUNTER — Other Ambulatory Visit: Payer: Self-pay

## 2022-06-24 DIAGNOSIS — I10 Essential (primary) hypertension: Secondary | ICD-10-CM

## 2022-06-24 MED ORDER — LISINOPRIL-HYDROCHLOROTHIAZIDE 10-12.5 MG PO TABS: 1.0000 | ORAL_TABLET | Freq: Every day | ORAL | 0 refills | Status: DC

## 2022-06-30 ENCOUNTER — Other Ambulatory Visit: Payer: Self-pay | Admitting: Family Medicine

## 2022-07-01 MED ORDER — AMPHETAMINE-DEXTROAMPHETAMINE 10 MG PO TABS: 10.0000 mg | ORAL_TABLET | Freq: Two times a day (BID) | ORAL | 0 refills | Status: DC

## 2022-07-01 NOTE — Telephone Encounter (Signed)
Last OV: 04/19/22 Next OV: no appt scheduled Last RF: 05/20/22

## 2022-07-28 ENCOUNTER — Other Ambulatory Visit: Payer: Self-pay | Admitting: Family Medicine

## 2022-07-28 DIAGNOSIS — F902 Attention-deficit hyperactivity disorder, combined type: Secondary | ICD-10-CM

## 2022-07-28 MED ORDER — AMPHETAMINE-DEXTROAMPHET ER 30 MG PO CP24: 30.0000 mg | ORAL_CAPSULE | ORAL | 0 refills | Status: DC

## 2022-08-05 ENCOUNTER — Other Ambulatory Visit: Payer: Self-pay | Admitting: Family Medicine

## 2022-08-06 MED ORDER — AMPHETAMINE-DEXTROAMPHETAMINE 10 MG PO TABS: 10.0000 mg | ORAL_TABLET | Freq: Two times a day (BID) | ORAL | 0 refills | Status: DC

## 2022-09-05 ENCOUNTER — Other Ambulatory Visit: Payer: Self-pay | Admitting: Family Medicine

## 2022-09-06 MED ORDER — AMPHETAMINE-DEXTROAMPHETAMINE 10 MG PO TABS: 10.0000 mg | ORAL_TABLET | Freq: Two times a day (BID) | ORAL | 0 refills | Status: DC

## 2022-09-18 ENCOUNTER — Other Ambulatory Visit: Payer: Self-pay | Admitting: Family Medicine

## 2022-09-18 DIAGNOSIS — I10 Essential (primary) hypertension: Secondary | ICD-10-CM

## 2022-10-04 ENCOUNTER — Other Ambulatory Visit: Payer: Self-pay | Admitting: Family Medicine

## 2022-10-04 NOTE — Telephone Encounter (Signed)
Patient also is aware that she needs to make an appointment and she states that she is going [place]out of [place] tomorrow. She is requesting supply until her appointment on 10/3.    Prescription Request  10/04/2022  LOV: 04/19/2022  What is the name of the medication or equipment? amphetamine-dextroamphetamine (ADDERALL XR) 30 MG 24 hr capsule and amphetamine-dextroamphetamine (ADDERALL) 10 MG tablet   Have you contacted your pharmacy to request a refill? Yes   Which pharmacy would you like this sent to?  [id number] Lorenza Evangelist, [place] - [address] AT [facility] [address] [facility] [address] Phone: [phone] Fax: [phone]      Patient notified that their request is being sent to the clinical staff for review and that they should receive a response within 2 business days. Please advise at [facility] [phone]

## 2022-10-07 MED ORDER — AMPHETAMINE-DEXTROAMPHETAMINE 10 MG PO TABS: 10.0000 mg | ORAL_TABLET | Freq: Two times a day (BID) | ORAL | 0 refills | Status: DC

## 2022-10-28 ENCOUNTER — Encounter: Payer: Self-pay | Admitting: Family Medicine

## 2022-10-28 ENCOUNTER — Ambulatory Visit: Payer: 59 | Admitting: Family Medicine

## 2022-10-28 VITALS — BP 127/90 | HR 91 | Ht 68.0 in | Wt 234.0 lb

## 2022-10-28 DIAGNOSIS — M25561 Pain in right knee: Secondary | ICD-10-CM

## 2022-10-28 DIAGNOSIS — Z1159 Encounter for screening for other viral diseases: Secondary | ICD-10-CM

## 2022-10-28 DIAGNOSIS — Z1231 Encounter for screening mammogram for malignant neoplasm of breast: Secondary | ICD-10-CM

## 2022-10-28 DIAGNOSIS — F902 Attention-deficit hyperactivity disorder, combined type: Secondary | ICD-10-CM

## 2022-10-28 DIAGNOSIS — I10 Essential (primary) hypertension: Secondary | ICD-10-CM | POA: Diagnosis not present

## 2022-10-28 DIAGNOSIS — Z23 Encounter for immunization: Secondary | ICD-10-CM | POA: Diagnosis not present

## 2022-10-28 DIAGNOSIS — Z7689 Persons encountering health services in other specified circumstances: Secondary | ICD-10-CM

## 2022-10-28 MED ORDER — AMPHETAMINE-DEXTROAMPHETAMINE 10 MG PO TABS
10.0000 mg | ORAL_TABLET | Freq: Every day | ORAL | 0 refills | Status: DC
Start: 2022-10-28 — End: 2023-02-07

## 2022-10-28 MED ORDER — TIRZEPATIDE-WEIGHT MANAGEMENT 2.5 MG/0.5ML ~~LOC~~ SOLN
2.5000 mg | SUBCUTANEOUS | 0 refills | Status: DC
Start: 2022-10-28 — End: 2022-11-11

## 2022-10-28 MED ORDER — AMPHETAMINE-DEXTROAMPHETAMINE 10 MG PO TABS
10.0000 mg | ORAL_TABLET | Freq: Every day | ORAL | 0 refills | Status: DC
Start: 2022-12-26 — End: 2023-02-07

## 2022-10-28 MED ORDER — AMPHETAMINE-DEXTROAMPHETAMINE 10 MG PO TABS
10.0000 mg | ORAL_TABLET | Freq: Every day | ORAL | 0 refills | Status: DC
Start: 2022-11-27 — End: 2023-02-07

## 2022-10-28 MED ORDER — AMPHETAMINE-DEXTROAMPHET ER 30 MG PO CP24
30.0000 mg | ORAL_CAPSULE | ORAL | 0 refills | Status: DC
Start: 2022-11-12 — End: 2023-02-07

## 2022-10-28 MED ORDER — AMPHETAMINE-DEXTROAMPHET ER 30 MG PO CP24
30.0000 mg | ORAL_CAPSULE | ORAL | 0 refills | Status: DC
Start: 2022-12-12 — End: 2023-02-07

## 2022-10-28 MED ORDER — AMPHETAMINE-DEXTROAMPHET ER 30 MG PO CP24
30.0000 mg | ORAL_CAPSULE | ORAL | 0 refills | Status: DC
Start: 2023-01-10 — End: 2023-02-07

## 2022-10-28 NOTE — Assessment & Plan Note (Signed)
Diastolic a little elevated today.  O/W doing well. Needs up to date labs.

## 2022-10-28 NOTE — Progress Notes (Signed)
Established Patient Office Visit  Subjective   Patient ID: Andrea Marquez, female    DOB: 21-Jan-1971  Age: 52 y.o. MRN: [id number]  Chief Complaint  Patient presents with   Medical Management of Chronic Issues    HPI Hypertension- Pt denies chest pain, SOB, dizziness, or heart palpitations.  Taking meds as directed w/o problems.  Denies medication side effects.  Never went for labs in March.   ADD - Reports symptoms are well controlled on current regime. Denies any problems with insomnia, chest pain, palpitations, or SOB.  She takes her 30 mg extended release in the morning and the 10 mg dose around lunchtime and occasionally will take 1 in the evening if she has a really long night.  Follow-up weight management-she was never able to find the [facility] and so would like to switch to Zepbound if possible.  She also injured her right knee on Monday she was doing some jogging with her daughter-in-law and says initially felt really good but towards the end of the run she started to feel some discomfort more medially around the patella.  And since then it has been more painful and swollen she has been icing it aggressively and taking ibuprofen she says some of the swelling has gone down but is still bothersome.    ROS    Objective:     BP (!) 127/90   Pulse 91   Ht 5\' 8"  (1.727 m)   Wt 234 lb (106.1 kg)   SpO2 99%   BMI 35.58 kg/m    Physical Exam Vitals and nursing note reviewed.  Constitutional:      Appearance: Normal appearance.  HENT:     Head: Normocephalic and atraumatic.  Eyes:     Conjunctiva/sclera: Conjunctivae normal.  Cardiovascular:     Rate and Rhythm: Normal rate and regular rhythm.  Pulmonary:     Effort: Pulmonary effort is normal.     Breath sounds: Normal breath sounds.  Skin:    General: Skin is warm and dry.  Neurological:     Mental Status: She is alert.  Psychiatric:        Mood and Affect: Mood normal.     No results found for any visits  on 10/28/22.    The 10-year ASCVD risk score (Arnett DK, et al., 2019) is: 2.1%    Assessment & Plan:   Problem List Items Addressed This Visit       Cardiovascular and Mediastinum   Hypertension    Diastolic a little elevated today.  O/W doing well. Needs up to date labs.       Relevant Orders   CMP14+EGFR   Lipid panel   CBC     Other   Encounter for weight management    Previously gotten Wegovy approved but there was such of backorder and shortage she could never find it anywhere.  She would like to start over again and see if maybe we could get Zepbound approved.  The backorder issue has been better with this particular medication but we did discuss that we will probably need a new prior authorization and I suspect that her old one on Wegovy has probably expired anyway.  Visit #: 1 Starting Weight: 243 lb   Current weight: 243 lbs  Previous weight: Change in weight: Goal weight: Dietary goals: Exercise goals: Walking and running  Medication: start Zepbound if covered by insurance.  Follow-up and referrals:        Relevant Medications  tirzepatide (ZEPBOUND) 2.5 MG/0.5ML injection vial   Attention deficit hyperactivity disorder (ADHD), combined type - Primary    Well controlled. Continue current regimen. Follow up in  70mo       Relevant Medications   amphetamine-dextroamphetamine (ADDERALL XR) 30 MG 24 hr capsule (Start on 11/12/2022)   amphetamine-dextroamphetamine (ADDERALL XR) 30 MG 24 hr capsule (Start on 12/12/2022)   amphetamine-dextroamphetamine (ADDERALL XR) 30 MG 24 hr capsule (Start on 01/10/2023)   amphetamine-dextroamphetamine (ADDERALL) 10 MG tablet   amphetamine-dextroamphetamine (ADDERALL) 10 MG tablet (Start on 11/27/2022)   amphetamine-dextroamphetamine (ADDERALL) 10 MG tablet (Start on 12/26/2022)   Other Visit Diagnoses     Encounter for hepatitis C screening test for low risk patient       Relevant Orders   Hepatitis C Antibody   Flu  vaccine need       Relevant Orders   Flu vaccine trivalent PF, 70mos and older(Flulaval,Afluria,Fluarix,Fluzone) (Completed)   Screening mammogram for breast cancer       Relevant Orders   MM 3D SCREENING MAMMOGRAM BILATERAL BREAST   Acute pain of right knee           Right knee pain -continue with icing and inflammatories can take 100 mg ibuprofen every 8 hours or 2 Aleve every 12 hours as needed through the weekend will work on maybe just getting back into a walking routine on a flat surface and see if that is tolerated well before advancing exercise.  If it continues to cause pain or the swelling does not resolve then we will be happy to schedule her with sports med or Ortho for further workup.  Return in about 6 months (around 04/28/2023) for ADD.    Nani Gasser, MD

## 2022-10-28 NOTE — Assessment & Plan Note (Addendum)
Previously gotten [place] approved but there was such of backorder and shortage she could never find it anywhere.  She would like to start over again and see if maybe we could get Zepbound approved.  The backorder issue has been better with this particular medication but we did discuss that we will probably need a new prior authorization and I suspect that her old one on Wegovy has probably expired anyway.  Visit #: 1 Starting Weight: 243 lb   Current weight: 243 lbs  Previous weight: Change in weight: Goal weight: Dietary goals: Exercise goals: Walking and running  Medication: start Zepbound if covered by insurance.  Follow-up and referrals:

## 2022-10-28 NOTE — Assessment & Plan Note (Signed)
Well controlled. Continue current regimen. Follow up in  6 mo  

## 2022-10-29 LAB — CBC
Hematocrit: 44.1 % (ref 34.0–46.6)
Hemoglobin: 14.5 g/dL (ref 11.1–15.9)
MCH: 31 pg (ref 26.6–33.0)
MCHC: 32.9 g/dL (ref 31.5–35.7)
MCV: 94 fL (ref 79–97)
Platelets: 268 10*3/uL (ref 150–450)
RBC: 4.68 x10E6/uL (ref 3.77–5.28)
RDW: 12.8 % (ref 11.7–15.4)
WBC: 5.8 10*3/uL (ref 3.4–10.8)

## 2022-10-29 LAB — CMP14+EGFR
ALT: 34 [IU]/L — ABNORMAL HIGH (ref 0–32)
AST: 23 [IU]/L (ref 0–40)
Albumin: 4.6 g/dL (ref 3.8–4.9)
Alkaline Phosphatase: 79 [IU]/L (ref 44–121)
BUN/Creatinine Ratio: 25 — ABNORMAL HIGH (ref 9–23)
BUN: 18 mg/dL (ref 6–24)
Bilirubin Total: 0.4 mg/dL (ref 0.0–1.2)
CO2: 22 mmol/L (ref 20–29)
Calcium: 9.9 mg/dL (ref 8.7–10.2)
Chloride: 100 mmol/L (ref 96–106)
Creatinine, Ser: 0.71 mg/dL (ref 0.57–1.00)
Globulin, Total: 2.4 g/dL (ref 1.5–4.5)
Glucose: 109 mg/dL — ABNORMAL HIGH (ref 70–99)
Potassium: 4.2 mmol/L (ref 3.5–5.2)
Sodium: 138 mmol/L (ref 134–144)
Total Protein: 7 g/dL (ref 6.0–8.5)
eGFR: 101 mL/min/{1.73_m2} (ref >59)

## 2022-10-29 LAB — LIPID PANEL
Chol/HDL Ratio: 3.2 {ratio} (ref 0.0–4.4)
Cholesterol, Total: 214 mg/dL — ABNORMAL HIGH (ref 100–199)
HDL: 66 mg/dL (ref >39)
LDL Chol Calc (NIH): 133 mg/dL — ABNORMAL HIGH (ref 0–99)
Triglycerides: 87 mg/dL (ref 0–149)
VLDL Cholesterol Cal: 15 mg/dL (ref 5–40)

## 2022-10-29 LAB — HEPATITIS C ANTIBODY: Hep C Virus Ab: NONREACTIVE

## 2022-10-29 NOTE — Progress Notes (Signed)
Hi Andrea Marquez Metabolic panel overall looks good the ALT liver enzymes back up just a little bit similar to about 4 years ago.  Will keep an eye on it plan to recheck again in 6 months.  Is not in a worrisome range.  Total cholesterol and LDL look better compared to 3 years ago.  Still little elevated so continue to work on healthy diet and regular exercise.  Blood counts normal.  We also screened for hepatitis C which we do once in your lifetime now that we have great treatments.  You are negative for hepatitis C.

## 2022-11-02 ENCOUNTER — Telehealth: Payer: Self-pay | Admitting: Family Medicine

## 2022-11-02 NOTE — Telephone Encounter (Signed)
Patient is following up on PA for Zepbound 2.5mg  she hasn't heard anything is is asking for someone to give her a call back [phone]  [facility] [phone]

## 2022-11-11 ENCOUNTER — Telehealth: Payer: Self-pay | Admitting: *Deleted

## 2022-11-11 ENCOUNTER — Telehealth: Payer: Self-pay

## 2022-11-11 ENCOUNTER — Telehealth: Payer: Self-pay | Admitting: Family Medicine

## 2022-11-11 DIAGNOSIS — Z7689 Persons encountering health services in other specified circumstances: Secondary | ICD-10-CM

## 2022-11-11 MED ORDER — WEGOVY 0.25 MG/0.5ML ~~LOC~~ SOAJ: 0.2500 mg | SUBCUTANEOUS | 0 refills | Status: DC

## 2022-11-11 NOTE — Telephone Encounter (Signed)
Called pt and advised her that the PA for Andrea Marquez was denied. She asked if Dr. Linford Arnold could either file an appeal or change this to [facility] since she has tried other medications and lifestyle changes to try losing weight.

## 2022-11-11 NOTE — Telephone Encounter (Signed)
Authorization Expiration Date: 05/12/2023

## 2022-11-11 NOTE — Telephone Encounter (Signed)
We can try to see if the [facility] will get approved.  But I am worried that if they did not approve the Zepbound they would likely not approve the [facility].  We did list the medications that she had tried in the past for weight loss etc. and wrote down the diagnoses codes that are complications from having an elevated BMI.  So there really is not any additional information that we would be able to submit through an appeal.

## 2022-11-11 NOTE — Telephone Encounter (Signed)
[employer] called about the PA  for Zepbound 2.5mg  they said it was denied  Case # [id number]   They're faxing over a copy of the denial

## 2022-11-11 NOTE — Telephone Encounter (Signed)
Initiated Prior authorization [id number] 2.5MG /0.5ML pen-injectors Via: Covermymeds Case/Key:[id number] Status: Pending as of 11/11/22 Reason: Notified Pt via: Mychart

## 2022-11-12 MED ORDER — TIRZEPATIDE-WEIGHT MANAGEMENT 2.5 MG/0.5ML ~~LOC~~ SOLN: 2.5000 mg | SUBCUTANEOUS | Status: DC

## 2022-11-12 NOTE — Addendum Note (Signed)
Addended by: Elizabeth Palau on: 11/12/2022 11:20 AM   Modules accepted: Orders

## 2022-11-12 NOTE — Telephone Encounter (Signed)
Attempted call to patient. Left a voice mail message requesting a return call.  

## 2022-11-12 NOTE — Telephone Encounter (Signed)
Zepbound PA was approved - called pharmacy and this went through for $24. Discontinued wegovy and reinstated the zepbound rx .

## 2022-11-25 NOTE — Telephone Encounter (Signed)
Attempted to contact the patient. No answer, left a brief vm msg for the patient to return a call back with an update.

## 2022-11-26 NOTE — Telephone Encounter (Signed)
Pt called.  She has no refills on Zepbound does she need to make an appointment?

## 2022-12-01 ENCOUNTER — Ambulatory Visit: Payer: 59

## 2022-12-01 ENCOUNTER — Other Ambulatory Visit: Payer: Self-pay | Admitting: Family Medicine

## 2022-12-01 DIAGNOSIS — Z1231 Encounter for screening mammogram for malignant neoplasm of breast: Secondary | ICD-10-CM

## 2022-12-01 DIAGNOSIS — Z1159 Encounter for screening for other viral diseases: Secondary | ICD-10-CM

## 2022-12-01 DIAGNOSIS — Z23 Encounter for immunization: Secondary | ICD-10-CM

## 2022-12-01 DIAGNOSIS — Z7689 Persons encountering health services in other specified circumstances: Secondary | ICD-10-CM

## 2022-12-01 DIAGNOSIS — F902 Attention-deficit hyperactivity disorder, combined type: Secondary | ICD-10-CM

## 2022-12-01 DIAGNOSIS — I10 Essential (primary) hypertension: Secondary | ICD-10-CM

## 2022-12-01 DIAGNOSIS — M25561 Pain in right knee: Secondary | ICD-10-CM

## 2022-12-01 NOTE — Telephone Encounter (Signed)
 Attempted to contact the patient. No answer, left a brief vm msg for the patient to return a call back with an update.

## 2022-12-03 ENCOUNTER — Other Ambulatory Visit: Payer: Self-pay | Admitting: Family Medicine

## 2022-12-03 DIAGNOSIS — Z7689 Persons encountering health services in other specified circumstances: Secondary | ICD-10-CM

## 2022-12-03 NOTE — Progress Notes (Signed)
Please call patient. Normal mammogram.  Repeat in 1 year.  

## 2022-12-06 NOTE — Telephone Encounter (Signed)
Call pt: re ceived request for 2.5mg  Zepbound. Would she like to go up to the next dose?

## 2022-12-07 ENCOUNTER — Other Ambulatory Visit: Payer: Self-pay

## 2022-12-07 DIAGNOSIS — Z7689 Persons encountering health services in other specified circumstances: Secondary | ICD-10-CM

## 2022-12-07 MED ORDER — TIRZEPATIDE-WEIGHT MANAGEMENT 5 MG/0.5ML ~~LOC~~ SOLN: 5.0000 mg | SUBCUTANEOUS | 1 refills | Status: DC

## 2022-12-07 NOTE — Telephone Encounter (Signed)
Already sent in another encounter

## 2022-12-07 NOTE — Telephone Encounter (Signed)
Task completed. Patient stated someone reached out to her regarding the auth for Zepound 2 weeks ago. No note was updated in the chart. Patient has completed 4 injections up to date. Per patient, she is tolerating the rx well.

## 2022-12-07 NOTE — Telephone Encounter (Signed)
Patient is requesting a refill for her Zepbound 2.5 mg. Per patient, when will she start increasing the weekly injection? She is currently on 2.5 mg. Rx pended.

## 2022-12-23 ENCOUNTER — Other Ambulatory Visit: Payer: Self-pay | Admitting: Family Medicine

## 2022-12-23 DIAGNOSIS — I10 Essential (primary) hypertension: Secondary | ICD-10-CM

## 2022-12-26 NOTE — Telephone Encounter (Signed)
This has been taken care of.

## 2022-12-27 ENCOUNTER — Other Ambulatory Visit: Payer: Self-pay | Admitting: Family Medicine

## 2022-12-27 DIAGNOSIS — I10 Essential (primary) hypertension: Secondary | ICD-10-CM

## 2022-12-27 DIAGNOSIS — F902 Attention-deficit hyperactivity disorder, combined type: Secondary | ICD-10-CM

## 2023-01-06 ENCOUNTER — Telehealth: Payer: Self-pay

## 2023-01-06 NOTE — Telephone Encounter (Signed)
Attempted call to patient . Left a voice mail requesting a return call.

## 2023-01-06 NOTE — Telephone Encounter (Signed)
Copied from CRM [id number]. Topic: Clinical - Prescription Issue >> Jan 04, 2023 11:57 AM Andrea Marquez wrote: Reason for CRM: Patient sent a request for Zepound via MyChart and it mentioned that it was denied - Patient was unclear why and whether they needed an appointment. Please call patient at [phone].

## 2023-01-08 ENCOUNTER — Encounter: Payer: Self-pay | Admitting: Family Medicine

## 2023-01-10 MED ORDER — TIRZEPATIDE-WEIGHT MANAGEMENT 7.5 MG/0.5ML ~~LOC~~ SOAJ: 7.5000 mg | SUBCUTANEOUS | 0 refills | Status: DC

## 2023-01-10 NOTE — Telephone Encounter (Signed)
7.5mg  sent. Needs to schedule f/u in about 3-4 weeks.

## 2023-01-11 NOTE — Telephone Encounter (Signed)
Medication refill sent yesterday.

## 2023-01-12 ENCOUNTER — Telehealth: Payer: Self-pay

## 2023-01-12 MED ORDER — TIRZEPATIDE-WEIGHT MANAGEMENT 7.5 MG/0.5ML ~~LOC~~ SOAJ: 7.5000 mg | SUBCUTANEOUS | 0 refills | Status: DC

## 2023-01-12 NOTE — Telephone Encounter (Signed)
New prescription sent

## 2023-01-12 NOTE — Telephone Encounter (Signed)
Copied from CRM [id number]. Topic: Clinical - Medication Question >> Jan 12, 2023 12:22 PM Donita Brooks wrote: Reason for CRM: Marylu Lund from [facility] is calling to see if Dr. Linford Arnold can another prescription of the tirzepatide (ZEPBOUND) 7.5 MG/0.5ML Pen

## 2023-02-07 ENCOUNTER — Ambulatory Visit: Payer: 59 | Admitting: Family Medicine

## 2023-02-07 VITALS — BP 94/66 | HR 78 | Ht 68.0 in | Wt 202.0 lb

## 2023-02-07 DIAGNOSIS — I10 Essential (primary) hypertension: Secondary | ICD-10-CM

## 2023-02-07 DIAGNOSIS — R748 Abnormal levels of other serum enzymes: Secondary | ICD-10-CM

## 2023-02-07 DIAGNOSIS — E7800 Pure hypercholesterolemia, unspecified: Secondary | ICD-10-CM | POA: Diagnosis not present

## 2023-02-07 DIAGNOSIS — F902 Attention-deficit hyperactivity disorder, combined type: Secondary | ICD-10-CM

## 2023-02-07 DIAGNOSIS — Z7689 Persons encountering health services in other specified circumstances: Secondary | ICD-10-CM

## 2023-02-07 MED ORDER — AMPHETAMINE-DEXTROAMPHETAMINE 10 MG PO TABS: 10.0000 mg | ORAL_TABLET | Freq: Every day | ORAL | 0 refills | Status: DC

## 2023-02-07 MED ORDER — LISINOPRIL 5 MG PO TABS: 5.0000 mg | ORAL_TABLET | Freq: Every day | ORAL | 1 refills | Status: DC

## 2023-02-07 MED ORDER — AMPHETAMINE-DEXTROAMPHET ER 30 MG PO CP24: 30.0000 mg | ORAL_CAPSULE | ORAL | 0 refills | Status: DC

## 2023-02-07 MED ORDER — ZEPBOUND 10 MG/0.5ML ~~LOC~~ SOAJ: 10.0000 mg | SUBCUTANEOUS | 1 refills | Status: DC

## 2023-02-07 NOTE — Assessment & Plan Note (Signed)
 Well controlled. Continue current regimen. Follow up in  6 months.

## 2023-02-07 NOTE — Progress Notes (Signed)
 Established Patient Office Visit  Subjective  Patient ID: BELLAH ALIA, female    DOB: 02/11/1970  Age: 53 y.o. MRN: [id number]  Chief Complaint  Patient presents with   ADHD    HPI  ADD - Reports symptoms are well controlled on current regime. Denies any problems with insomnia, chest pain, palpitations, or SOB.    F/U weight mgt -she is doing really well with the Zepbound  she really has not had any nausea she has noticed bowels slowing down a little bit so she is purposely tried to increase her water intake she is really worked on trying to get about 75 g of protein in daily.  She and her daughter are both working on their diet together so that is been helpful.  She did let me know that the psychiatrist decrease the Abilify to half a tab because she felt like it was really making it more difficult for her to lose weight and since then she has been doing better.  She is not currently exercising but plans to get back on track with her walking next week.  She is on her third week of the 7.5 mg Zepbound .     ROS    Objective:     BP 94/66   Pulse 78   Ht 5' 8 (1.727 m)   Wt 202 lb (91.6 kg)   SpO2 96%   BMI 30.71 kg/m     Physical Exam Vitals and nursing note reviewed.  Constitutional:      Appearance: Normal appearance.  HENT:     Head: Normocephalic and atraumatic.  Eyes:     Conjunctiva/sclera: Conjunctivae normal.  Cardiovascular:     Rate and Rhythm: Normal rate and regular rhythm.  Pulmonary:     Effort: Pulmonary effort is normal.     Breath sounds: Normal breath sounds.  Skin:    General: Skin is warm and dry.  Neurological:     Mental Status: She is alert.  Psychiatric:        Mood and Affect: Mood normal.     No results found for any visits on 02/07/23.     The 10-year ASCVD risk score (Arnett DK, et al., 2019) is: 1.2%    Assessment & Plan:   Problem List Items Addressed This Visit       Cardiovascular and Mediastinum   Hypertension    BP is on low end bc of weight loss,  so will d/c lisinopril  HCT and switch to plain lisinopril   monitor.       Relevant Medications   lisinopril  (ZESTRIL ) 5 MG tablet     Other   Encounter for weight management - Primary   Visit #: 2 Starting Weight: 243 lb    Current weight: 202 lbs  Previous weight: 243 lbs  Change in weight:down 41 lbs  Goal weight: will set goal at next OV.   Dietary goals: continue to work on portions and doing great with protein intake. Getting 75g per day.  Exercise goals: Walking and running for exercise.  Medication: Inc Zepbound  to 10mg   Follow-up and referrals: 8 weeks      Relevant Medications   tirzepatide  (ZEPBOUND ) 10 MG/0.5ML Pen   Elevated LDL cholesterol level   Recheck levels with recent weight loss. BMI down to 30      Relevant Orders   Direct LDL   CMP14+EGFR   Attention deficit hyperactivity disorder (ADHD), combined type   Well controlled. Continue current regimen. Follow up  in  6 months.        Relevant Medications   amphetamine -dextroamphetamine  (ADDERALL XR) 30 MG 24 hr capsule   amphetamine -dextroamphetamine  (ADDERALL XR) 30 MG 24 hr capsule (Start on 03/09/2023)   amphetamine -dextroamphetamine  (ADDERALL XR) 30 MG 24 hr capsule (Start on 04/05/2023)   amphetamine -dextroamphetamine  (ADDERALL) 10 MG tablet   amphetamine -dextroamphetamine  (ADDERALL) 10 MG tablet (Start on 03/09/2023)   amphetamine -dextroamphetamine  (ADDERALL) 10 MG tablet (Start on 04/05/2023)   Other Visit Diagnoses       Elevated liver enzymes       Relevant Orders   Direct LDL   CMP14+EGFR       Hopefully liver enzymes are improved.  Recheck today.    Return in about 8 weeks (around 04/04/2023) for weight management .    Dorothyann Byars, MD

## 2023-02-07 NOTE — Assessment & Plan Note (Signed)
 BP is on low end bc of weight loss,  so will d/c lisinopril HCT and switch to plain lisinopril  monitor.

## 2023-02-07 NOTE — Assessment & Plan Note (Signed)
 Recheck levels with recent weight loss. BMI down to 30

## 2023-02-07 NOTE — Assessment & Plan Note (Addendum)
 Visit #: 2 Starting Weight: 243 lb    Current weight: 202 lbs  Previous weight: 243 lbs  Change in weight:down 41 lbs  Goal weight: will set goal at next OV.   Dietary goals: continue to work on portions and doing great with protein intake. Getting 75g per day.  Exercise goals: Walking and running for exercise.  Medication: Inc Zepbound  to 10mg   Follow-up and referrals: 8 weeks

## 2023-02-08 LAB — CMP14+EGFR
ALT: 12 [IU]/L (ref 0–32)
AST: 11 [IU]/L (ref 0–40)
Albumin: 5 g/dL — ABNORMAL HIGH (ref 3.8–4.9)
Alkaline Phosphatase: 74 [IU]/L (ref 44–121)
BUN/Creatinine Ratio: 26 — ABNORMAL HIGH (ref 9–23)
BUN: 21 mg/dL (ref 6–24)
Bilirubin Total: 0.4 mg/dL (ref 0.0–1.2)
CO2: 23 mmol/L (ref 20–29)
Calcium: 9.8 mg/dL (ref 8.7–10.2)
Chloride: 101 mmol/L (ref 96–106)
Creatinine, Ser: 0.81 mg/dL (ref 0.57–1.00)
Globulin, Total: 2.1 g/dL (ref 1.5–4.5)
Glucose: 91 mg/dL (ref 70–99)
Potassium: 4.5 mmol/L (ref 3.5–5.2)
Sodium: 141 mmol/L (ref 134–144)
Total Protein: 7.1 g/dL (ref 6.0–8.5)
eGFR: 86 mL/min/{1.73_m2} (ref >59)

## 2023-02-08 LAB — LDL CHOLESTEROL, DIRECT: LDL Direct: 134 mg/dL — ABNORMAL HIGH (ref 0–99)

## 2023-02-08 NOTE — Progress Notes (Signed)
 Hi Andrea Marquez, LDL was 134 so it is actually similar to what it was back in October.  We can plan to recheck it again in 6 months to see if we can get that number to budge.  The great news is that the liver enzyme ALT, is back to normal and looks absolutely fantastic so that means that your liver is less stressed which is awesome.

## 2023-02-21 ENCOUNTER — Other Ambulatory Visit: Payer: Self-pay | Admitting: Family Medicine

## 2023-04-15 ENCOUNTER — Other Ambulatory Visit: Payer: Self-pay | Admitting: Family Medicine

## 2023-04-15 DIAGNOSIS — Z7689 Persons encountering health services in other specified circumstances: Secondary | ICD-10-CM

## 2023-04-15 DIAGNOSIS — F902 Attention-deficit hyperactivity disorder, combined type: Secondary | ICD-10-CM

## 2023-04-15 MED ORDER — AMPHETAMINE-DEXTROAMPHETAMINE 10 MG PO TABS: 10.0000 mg | ORAL_TABLET | Freq: Every day | ORAL | 0 refills | Status: DC

## 2023-04-15 MED ORDER — AMPHETAMINE-DEXTROAMPHET ER 30 MG PO CP24: 30.0000 mg | ORAL_CAPSULE | ORAL | 0 refills | Status: DC

## 2023-04-15 NOTE — Telephone Encounter (Signed)
 Duplicate request

## 2023-04-20 ENCOUNTER — Other Ambulatory Visit (HOSPITAL_COMMUNITY): Payer: Self-pay

## 2023-05-12 ENCOUNTER — Other Ambulatory Visit: Payer: Self-pay | Admitting: Family Medicine

## 2023-05-12 DIAGNOSIS — Z7689 Persons encountering health services in other specified circumstances: Secondary | ICD-10-CM

## 2023-05-12 NOTE — Telephone Encounter (Signed)
 Scheduled, thank you.

## 2023-05-12 NOTE — Telephone Encounter (Signed)
 Please call pt she will need a f/u appt for weight check for refills on Zepbound. Thanks!!

## 2023-06-09 ENCOUNTER — Other Ambulatory Visit: Payer: Self-pay | Admitting: Family Medicine

## 2023-06-09 DIAGNOSIS — Z7689 Persons encountering health services in other specified circumstances: Secondary | ICD-10-CM

## 2023-06-10 NOTE — Telephone Encounter (Signed)
 Tirzepatide  refilled though she was supposed to follow-up in March.  She does have an appointment next week so I did refill for 30 more days but all future refills will be denied unless she comes in for an appointment before next refill.

## 2023-06-13 ENCOUNTER — Encounter: Payer: Self-pay | Admitting: Family Medicine

## 2023-06-13 ENCOUNTER — Ambulatory Visit: Admitting: Family Medicine

## 2023-06-13 VITALS — BP 127/75 | HR 71 | Ht 68.0 in | Wt 202.0 lb

## 2023-06-13 DIAGNOSIS — F902 Attention-deficit hyperactivity disorder, combined type: Secondary | ICD-10-CM | POA: Diagnosis not present

## 2023-06-13 DIAGNOSIS — Z7689 Persons encountering health services in other specified circumstances: Secondary | ICD-10-CM | POA: Diagnosis not present

## 2023-06-13 MED ORDER — AMPHETAMINE-DEXTROAMPHET ER 30 MG PO CP24
30.0000 mg | ORAL_CAPSULE | ORAL | 0 refills | Status: DC
Start: 2023-07-11 — End: 2023-08-10

## 2023-06-13 MED ORDER — AMPHETAMINE-DEXTROAMPHET ER 30 MG PO CP24: 30.0000 mg | ORAL_CAPSULE | ORAL | 0 refills | Status: DC

## 2023-06-13 MED ORDER — AMPHETAMINE-DEXTROAMPHETAMINE 10 MG PO TABS: 10.0000 mg | ORAL_TABLET | Freq: Every day | ORAL | 0 refills | Status: DC

## 2023-06-13 MED ORDER — ZEPBOUND 12.5 MG/0.5ML ~~LOC~~ SOAJ
12.5000 mg | SUBCUTANEOUS | 0 refills | Status: DC
Start: 2023-06-13 — End: 2023-08-10

## 2023-06-13 NOTE — Progress Notes (Signed)
 Established Patient Office Visit  Subjective  Patient ID: Andrea Marquez, female    DOB: 09-17-1970  Age: 53 y.o. MRN: [id number]  Chief Complaint  Patient presents with   Weight Check    HPI  Here today for weight management she is currently on tirzepatide  10 mg.  More recently she feels like she has plateaued during the daytime she will have decreased appetite but then in the evening she gets a ramp-up in appetite.  ADD - Reports symptoms are well controlled on current regime. Denies any problems with insomnia, chest pain, palpitations, or SOB.    Mood - psych recently went down on abilify and up on lamictal to see if helps with appetite.    ROS    Objective:     BP 127/75   Pulse 71   Ht 5\' 8"  (1.727 m)   Wt 202 lb (91.6 kg)   SpO2 100%   BMI 30.71 kg/m     Physical Exam Vitals and nursing note reviewed.  Constitutional:      Appearance: Normal appearance.  HENT:     Head: Normocephalic and atraumatic.  Eyes:     Conjunctiva/sclera: Conjunctivae normal.  Cardiovascular:     Rate and Rhythm: Normal rate and regular rhythm.  Pulmonary:     Effort: Pulmonary effort is normal.     Breath sounds: Normal breath sounds.  Skin:    General: Skin is warm and dry.  Neurological:     Mental Status: She is alert.  Psychiatric:        Mood and Affect: Mood normal.      No results found for any visits on 06/13/23.     The 10-year ASCVD risk score (Arnett DK, et al., 2019) is: 2.4%    Assessment & Plan:   Problem List Items Addressed This Visit       Other   Encounter for weight management   She has plateaued with her weight over the last couple of months.  We discussed some strategies around eating more consistently during the daytime so to see if that would help with some of the cravings that really ramped up at night.  She also recently decreased her Abilify with her psychiatrist to see if maybe that would help.  We also discussed bumping up the Zepbound   to 12.5 mg.   Visit #: 3 Starting Weight: 243 lb    Current weight: 202 lbs.   Previous weight: 202 lbs  Change in weight: down 41 lbs  Goal weight: .  155 lbs ( ideal 140lbs)  Dietary goals: continue to work on portions and doing great with protein intake. Getting 75g protein per day.  Exercise goals: Walking and running for exercise.  Medication: In  Zepbound  to 12.5 mg  Follow-up and referrals: 8 weeks      Relevant Medications   tirzepatide  (ZEPBOUND ) 12.5 MG/0.5ML Pen   Attention deficit hyperactivity disorder (ADHD), combined type - Primary   Doign well with regimen. Due for refills.  BP at goal.       Relevant Medications   amphetamine -dextroamphetamine  (ADDERALL XR) 30 MG 24 hr capsule   amphetamine -dextroamphetamine  (ADDERALL XR) 30 MG 24 hr capsule (Start on 07/11/2023)   amphetamine -dextroamphetamine  (ADDERALL XR) 30 MG 24 hr capsule (Start on 08/08/2023)   amphetamine -dextroamphetamine  (ADDERALL) 10 MG tablet   amphetamine -dextroamphetamine  (ADDERALL) 10 MG tablet (Start on 07/11/2023)   amphetamine -dextroamphetamine  (ADDERALL) 10 MG tablet (Start on 08/08/2023)    Return in about 2 months (  around 08/13/2023) for weight mgt .    Duaine German, MD

## 2023-06-13 NOTE — Assessment & Plan Note (Addendum)
 She has plateaued with her weight over the last couple of months.  We discussed some strategies around eating more consistently during the daytime so to see if that would help with some of the cravings that really ramped up at night.  She also recently decreased her Abilify with her psychiatrist to see if maybe that would help.  We also discussed bumping up the Zepbound  to 12.5 mg.   Visit #: 3 Starting Weight: 243 lb    Current weight: 202 lbs.   Previous weight: 202 lbs  Change in weight: down 41 lbs  Goal weight: .  155 lbs ( ideal 140lbs)  Dietary goals: continue to work on portions and doing great with protein intake. Getting 75g protein per day.  Exercise goals: Walking and running for exercise.  Medication: In  Zepbound  to 12.5 mg  Follow-up and referrals: 8 weeks

## 2023-06-13 NOTE — Assessment & Plan Note (Signed)
 Doign well with regimen. Due for refills.  BP at goal.

## 2023-06-21 ENCOUNTER — Telehealth: Payer: Self-pay

## 2023-06-21 ENCOUNTER — Other Ambulatory Visit (HOSPITAL_COMMUNITY): Payer: Self-pay

## 2023-06-21 NOTE — Telephone Encounter (Signed)
 Pharmacy Patient Advocate Encounter   Received notification from Pt Calls Messages that prior authorization for Zepbound  12.5mg /0.64ml is required/requested.   Insurance verification completed.   The patient is insured through [facility] .   Per test claim: PA required; PA submitted to above mentioned insurance via CoverMyMeds Key/confirmation #/EOC [facility] Status is pending

## 2023-06-21 NOTE — Telephone Encounter (Signed)
 PA is required for Zepbound  12.5 mg. Thanks in advance.

## 2023-06-21 NOTE — Telephone Encounter (Signed)
 Copied from CRM [id number]. Topic: Clinical - Prescription Issue >> Jun 21, 2023  1:24 PM Brynn Caras wrote: Reason for CRM: On 05/19 patient was seen by her PCP, she states her Zepbound  dosage was increased, pharmacy states further acition is needed by her PCP for this prescription to be refilled. Pharmacy advised the patient faxed request was sent and they have not received a response.  Callback [phone]

## 2023-06-21 NOTE — Telephone Encounter (Signed)
 Please review. This was sent to the wrong office.  KP  Copied from CRM [id number]. Topic: Clinical - Medication Prior Auth >> Jun 21, 2023  1:22 PM Corin V wrote: Reason for CRM: Patient said that insurance and pharmacy ave ben trying to contact PCP since 5/19 to request that the Zepbound  prescription has a prior authorization done. Please complete prior authorization and advise patient once it has been submitted so she is aware it is being worked on and what a possible turnaround time for the approval may be.

## 2023-06-22 ENCOUNTER — Telehealth: Payer: Self-pay

## 2023-06-22 NOTE — Telephone Encounter (Signed)
 Copied from CRM [id number]. Topic: Clinical - Medication Prior Auth >> Jun 22, 2023 11:50 AM Andrea Marquez wrote: Reason for CRM: Andrea Marquez from [employer] that the rx for Zepbound  has been denied, they sent over the denial information over to the clinic via fax, Andrea Marquez is wanting to know could you call and inform the patient about the denial?

## 2023-06-22 NOTE — Telephone Encounter (Signed)
 Copying this from prior note:    Pharmacy Patient Advocate Encounter   Received notification from Pt Calls Messages that prior authorization for Zepbound  12.5mg /0.70ml is required/requested.   Insurance verification completed.   The patient is insured through [facility] .   Per test claim: PA required; PA submitted to above mentioned insurance via CoverMyMeds Key/confirmation #/EOC [facility] Status is pending

## 2023-06-22 NOTE — Telephone Encounter (Signed)
 Has the Zepbound  been denied?

## 2023-06-23 ENCOUNTER — Telehealth: Payer: Self-pay

## 2023-06-23 MED ORDER — ZEPBOUND 10 MG/0.5ML ~~LOC~~ SOAJ: 10.0000 mg | SUBCUTANEOUS | 0 refills | Status: DC

## 2023-06-23 NOTE — Telephone Encounter (Signed)
 Copied from CRM [id number]. Topic: Clinical - Prescription Issue >> Jun 23, 2023  4:01 PM Brynn Caras wrote: Reason for CRM: Patient is requesting a direct callback in regards to her Zepbound  PA denial, she would like to determine options available for an alternative that would be covered through her insurance. Callback #:[phone].

## 2023-06-23 NOTE — Telephone Encounter (Signed)
 Did sent refill for 10mg  which was previously approved. I don't see info under medial tab in regards to PA for the 12.5mg .   Concern is being handled so I am not calling patient as I cannot do anything different than reach out to our PA team which is what is being done.

## 2023-06-23 NOTE — Telephone Encounter (Signed)
 The patient is requesting an appeal. Per patient, this is a continuation of therapy. Thanks in advance.

## 2023-06-23 NOTE — Telephone Encounter (Signed)
 Pharmacy Patient Advocate Encounter  Received notification from [employer] that Prior Authorization for Zepbound  12.5mg /0.61ml has been DENIED.  See denial reason below. No denial letter attached in CMM. Will attach denial letter to Media tab once received.   PA #/Case ID/Reference #: : [id number]

## 2023-06-23 NOTE — Telephone Encounter (Signed)
 Contacted the patient regarding the Zepbound  denial. Patient was provided the outcome info. I proceeded to tell the patient to check what is covered through her plan directly. The patient interrupted me and mentioned the rx is a continuation of therapy. It should be noted in her chart she titrated to the current dose and that I should have known this info prior to calling her. The patient then became very irate, started yelling regarding the process and handling of this request. She proceeded to asked me why was I treating her like an idiot, and if I was an idiot, since I am just reading what is noted in her chart. As well as stating why she has to do the my job to contact the insurance. I apologized to the patient, voicing my understanding regarding her frustrations.  During the call, she continued to belittle me. After several insults, I did ask the patient to speak to me respectfully. Or that I was going to end the call. I did tell her I was trying to address her concern. The patient somewhat calm down. I did inform the patient that the prior auths are handled by an external team. The patient is aware that I will send a msg directly to the Rx auth team to appeal the decision since this is a cont' of therapy. She asked for my name and title which I willingly provided. The patient is requesting a direct call from the provider to discuss her discontentment. She mentioned this has been an ongoing issue since 06/03/23.

## 2023-06-24 ENCOUNTER — Telehealth: Payer: Self-pay | Admitting: Pharmacist

## 2023-06-24 NOTE — Telephone Encounter (Signed)
 Appeal has been submitted for Zepbound . Will advise when response is received, please be advised that most companies may take 30 days to make a decision. Appeal letter and supporting documentation have been faxed to [phone] on 06/24/2023 @3 :59 pm.  Thank you, Dene Fines, PharmD Clinical Pharmacist  [place]  Direct Dial: [phone]

## 2023-06-27 NOTE — Telephone Encounter (Signed)
 See MyChart message

## 2023-06-28 NOTE — Telephone Encounter (Addendum)
 Can we do an appeal ( or is that underway) and make sure her starting weight, etc is submitted.  Also can let her know she could consider a compounded version from a local pharmacy in [place]. I think around $200-$250 per months. She may also want to check with her insurance to see if maybe they will cover Ozempic, instead. Unfortunately they didn't provide us  alternative list to Zepbound .     Also I did resend the 10mg  since that was previously approved so that she could at least stay on that for now.

## 2023-07-01 ENCOUNTER — Other Ambulatory Visit (HOSPITAL_COMMUNITY): Payer: Self-pay

## 2023-07-04 ENCOUNTER — Other Ambulatory Visit (HOSPITAL_COMMUNITY): Payer: Self-pay

## 2023-07-04 ENCOUNTER — Encounter: Payer: Self-pay | Admitting: Family Medicine

## 2023-07-05 ENCOUNTER — Other Ambulatory Visit (HOSPITAL_COMMUNITY): Payer: Self-pay

## 2023-07-05 NOTE — Telephone Encounter (Signed)
 Dr. Greer Leak called and spoke with patient.

## 2023-07-06 ENCOUNTER — Telehealth: Payer: Self-pay | Admitting: Family Medicine

## 2023-07-06 NOTE — Telephone Encounter (Signed)
 See if we could submit patient Zepbound  for diagnosis of obstructive sleep apnea-she does have sleep apnea and is currently using CPAP.  Last sleep study was November 2023 and CPAP was set to 9 cm of water pressure.  The Zepbound  was recently denied for weight management which they had been covering and in fact we were getting ready to push her dose to 12.5 mg when it was denied.  So much for any help you could provide on this 1.

## 2023-07-06 NOTE — Telephone Encounter (Signed)
 Dr. Greer Leak called and spoke with patient.  On 07/05/2023

## 2023-07-07 ENCOUNTER — Other Ambulatory Visit (HOSPITAL_COMMUNITY): Payer: Self-pay

## 2023-07-07 NOTE — Telephone Encounter (Signed)
 Pharmacy Patient Advocate Encounter  Received notification from [employer] that an appeal for Zepbound  12.5mg /0.2ml has been DENIED.  Full denial letter will be uploaded to the media tab. See denial reason below.   PA #/Case ID/Reference #: [id number]    Per the representative, Zepbound  is an excluded drug on the formulary and would not be covered.

## 2023-07-07 NOTE — Telephone Encounter (Signed)
 Please notify patient as below per what our pharmacy said  The appeal for the Zepound was denied due to it is not covered under the plan. I spoke with a rep and she stated it is an excluded medication and is not covered for sleep apnea or weight loss.

## 2023-07-07 NOTE — Telephone Encounter (Signed)
 This request has been handled. Please review other telephone encounters for additional information. No further action is required.

## 2023-07-08 ENCOUNTER — Telehealth: Payer: Self-pay | Admitting: Family Medicine

## 2023-07-08 DIAGNOSIS — F902 Attention-deficit hyperactivity disorder, combined type: Secondary | ICD-10-CM

## 2023-07-08 NOTE — Telephone Encounter (Signed)
 Mychart sent.

## 2023-08-10 ENCOUNTER — Ambulatory Visit: Admitting: Family Medicine

## 2023-08-10 ENCOUNTER — Encounter: Payer: Self-pay | Admitting: Family Medicine

## 2023-08-10 VITALS — BP 128/81 | HR 84 | Ht 68.0 in | Wt 212.7 lb

## 2023-08-10 DIAGNOSIS — E7800 Pure hypercholesterolemia, unspecified: Secondary | ICD-10-CM | POA: Diagnosis not present

## 2023-08-10 DIAGNOSIS — Z7689 Persons encountering health services in other specified circumstances: Secondary | ICD-10-CM

## 2023-08-10 DIAGNOSIS — I10 Essential (primary) hypertension: Secondary | ICD-10-CM

## 2023-08-10 DIAGNOSIS — Z713 Dietary counseling and surveillance: Secondary | ICD-10-CM

## 2023-08-10 DIAGNOSIS — F902 Attention-deficit hyperactivity disorder, combined type: Secondary | ICD-10-CM

## 2023-08-10 MED ORDER — AMPHETAMINE-DEXTROAMPHETAMINE 10 MG PO TABS: 10.0000 mg | ORAL_TABLET | Freq: Every day | ORAL | 0 refills | Status: DC

## 2023-08-10 MED ORDER — AMPHETAMINE-DEXTROAMPHET ER 30 MG PO CP24: 30.0000 mg | ORAL_CAPSULE | ORAL | 0 refills | Status: DC

## 2023-08-10 MED ORDER — HYDROCHLOROTHIAZIDE 12.5 MG PO CAPS: 12.5000 mg | ORAL_CAPSULE | Freq: Every day | ORAL | 1 refills | Status: DC

## 2023-08-10 NOTE — Progress Notes (Signed)
 Established Patient Office Visit  Subjective  Patient ID: Andrea Marquez, female    DOB: 03/05/1970  Age: 53 y.o. MRN: [id number]  Chief Complaint  Patient presents with   Weight Check   ADHD    HPI  Here for f/u weight mgt -she has been really frustrated with the process for trying to get the weight medications approved.  Unfortunately her insurance will not cover Zepbound  at all.  We even went back to the insurance to request authorization under the diagnosis code of obstructive sleep apnea which she has, and they denied it again.  Follow-up ADHD-currently on 30 mg extended release Adderall and 10 mg short acting midday.  She has been having difficulty getting her medications consistently filled recently.  There was some issues with the pharmacy had requested refills but according to our records they were on file so we had denied the refills because according to our records there was still refills on file for June and for July.  She was able to finally get her medication because they found an old prescription from March that had not been filled which they then filled.  And she was able to get her long-acting last week so she is back on medication.  She is fasting today would like to go ahead and get updated labs today if at all possible.    ROS    Objective:     BP 128/81   Pulse 84   Ht 5' 8 (1.727 m)   Wt 212 lb 11.2 oz (96.5 kg)   SpO2 100%   BMI 32.34 kg/m    Physical Exam Vitals reviewed.  Constitutional:      Appearance: Normal appearance.  HENT:     Head: Normocephalic.  Pulmonary:     Effort: Pulmonary effort is normal.  Neurological:     Mental Status: She is alert and oriented to person, place, and time.  Psychiatric:        Mood and Affect: Mood normal.      No results found for any visits on 08/10/23.    The 10-year ASCVD risk score (Arnett DK, et al., 2019) is: 2.4%    Assessment & Plan:   Problem List Items Addressed This Visit        Cardiovascular and Mediastinum   Hypertension   We had recently taken her off of the hydrochlorothiazide  component of her blood pressure medication because she was having some lightheaded spells and lower blood pressure readings.  But since then she has noticed she is retaining a little bit more fluid in her hands and feet.  We discussed discontinuing the lisinopril  completely and putting her back on just a low-dose of hydrochlorothiazide  to help a little bit with the swelling and maintain a good blood pressure.  Blood pressure is excellent today.      Relevant Medications   hydrochlorothiazide  (MICROZIDE ) 12.5 MG capsule   Other Relevant Orders   CMP14+EGFR   Lipid panel   CBC   TSH     Other   Encounter for weight management - Primary   She has been trying to put a lot of focus on diet and she exercises regularly.  We did discuss the possibility of compounding but she really wants to do this without the medication.       Elevated LDL cholesterol level   Relevant Orders   CMP14+EGFR   Lipid panel   CBC   TSH   Attention deficit hyperactivity disorder (ADHD),  combined type   Relevant Medications   amphetamine -dextroamphetamine  (ADDERALL XR) 30 MG 24 hr capsule (Start on 09/04/2023)   amphetamine -dextroamphetamine  (ADDERALL XR) 30 MG 24 hr capsule (Start on 10/04/2023)   amphetamine -dextroamphetamine  (ADDERALL XR) 30 MG 24 hr capsule (Start on 11/02/2023)   amphetamine -dextroamphetamine  (ADDERALL) 10 MG tablet (Start on 09/04/2023)   amphetamine -dextroamphetamine  (ADDERALL) 10 MG tablet (Start on 10/04/2023)   amphetamine -dextroamphetamine  (ADDERALL) 10 MG tablet (Start on 11/02/2023)    Return in about 4 months (around 11/28/2023).    Dorothyann Byars, MD

## 2023-08-10 NOTE — Assessment & Plan Note (Signed)
 We had recently taken her off of the hydrochlorothiazide  component of her blood pressure medication because she was having some lightheaded spells and lower blood pressure readings.  But since then she has noticed she is retaining a little bit more fluid in her hands and feet.  We discussed discontinuing the lisinopril  completely and putting her back on just a low-dose of hydrochlorothiazide  to help a little bit with the swelling and maintain a good blood pressure.  Blood pressure is excellent today.

## 2023-08-10 NOTE — Assessment & Plan Note (Addendum)
 She has been trying to put a lot of focus on diet and she exercises regularly.  We did discuss the possibility of compounding but she really wants to do this without the medication.

## 2023-08-11 ENCOUNTER — Ambulatory Visit: Payer: Self-pay | Admitting: Family Medicine

## 2023-08-11 LAB — CMP14+EGFR
ALT: 17 IU/L (ref 0–32)
AST: 14 IU/L (ref 0–40)
Albumin: 4.5 g/dL (ref 3.8–4.9)
Alkaline Phosphatase: 71 IU/L (ref 44–121)
BUN/Creatinine Ratio: 25 — ABNORMAL HIGH (ref 9–23)
BUN: 18 mg/dL (ref 6–24)
Bilirubin Total: 0.3 mg/dL (ref 0.0–1.2)
CO2: 22 mmol/L (ref 20–29)
Calcium: 9.6 mg/dL (ref 8.7–10.2)
Chloride: 103 mmol/L (ref 96–106)
Creatinine, Ser: 0.72 mg/dL (ref 0.57–1.00)
Globulin, Total: 2.2 g/dL (ref 1.5–4.5)
Glucose: 103 mg/dL — ABNORMAL HIGH (ref 70–99)
Potassium: 4.3 mmol/L (ref 3.5–5.2)
Sodium: 142 mmol/L (ref 134–144)
Total Protein: 6.7 g/dL (ref 6.0–8.5)
eGFR: 99 mL/min/1.73 (ref >59)

## 2023-08-11 LAB — CBC
Hematocrit: 44.4 % (ref 34.0–46.6)
Hemoglobin: 14.4 g/dL (ref 11.1–15.9)
MCH: 31.4 pg (ref 26.6–33.0)
MCHC: 32.4 g/dL (ref 31.5–35.7)
MCV: 97 fL (ref 79–97)
Platelets: 285 x10E3/uL (ref 150–450)
RBC: 4.59 x10E6/uL (ref 3.77–5.28)
RDW: 12.9 % (ref 11.7–15.4)
WBC: 5.4 x10E3/uL (ref 3.4–10.8)

## 2023-08-11 LAB — LIPID PANEL
Chol/HDL Ratio: 2.9 ratio (ref 0.0–4.4)
Cholesterol, Total: 203 mg/dL — ABNORMAL HIGH (ref 100–199)
HDL: 70 mg/dL (ref >39)
LDL Chol Calc (NIH): 122 mg/dL — ABNORMAL HIGH (ref 0–99)
Triglycerides: 61 mg/dL (ref 0–149)
VLDL Cholesterol Cal: 11 mg/dL (ref 5–40)

## 2023-08-11 LAB — TSH: TSH: 1.81 u[IU]/mL (ref 0.450–4.500)

## 2023-08-11 NOTE — Progress Notes (Signed)
 Hi Andrea Marquez, your metabolic panel overall looks good including liver and kidney function.  Kos level was just off by couple of points I think you said you were fasting so organ to call the lab and see if we can add a hemoglobin A1c just to screen for prediabetes.  Total cholesterol and LDL are slightly elevated.  But better than last year which is fantastic.  Blood count looks normal no sign of anemia.  Thyroid  level looks perfect.

## 2023-08-12 LAB — SPECIMEN STATUS REPORT

## 2023-08-12 LAB — HGB A1C W/O EAG: Hgb A1c MFr Bld: 5.2 % (ref 4.8–5.6)

## 2023-08-12 NOTE — Progress Notes (Signed)
 A1C looks great!! In the normal range.

## 2023-09-18 ENCOUNTER — Other Ambulatory Visit: Payer: Self-pay | Admitting: Family Medicine

## 2023-09-18 DIAGNOSIS — I10 Essential (primary) hypertension: Secondary | ICD-10-CM

## 2023-09-27 ENCOUNTER — Encounter: Payer: Self-pay | Admitting: Sports Medicine

## 2023-12-13 ENCOUNTER — Ambulatory Visit: Admitting: Family Medicine

## 2023-12-13 VITALS — BP 128/84 | HR 91 | Ht 68.0 in | Wt 242.0 lb

## 2023-12-13 DIAGNOSIS — Z23 Encounter for immunization: Secondary | ICD-10-CM | POA: Diagnosis not present

## 2023-12-13 DIAGNOSIS — R600 Localized edema: Secondary | ICD-10-CM | POA: Diagnosis not present

## 2023-12-13 DIAGNOSIS — F902 Attention-deficit hyperactivity disorder, combined type: Secondary | ICD-10-CM | POA: Diagnosis not present

## 2023-12-13 DIAGNOSIS — F332 Major depressive disorder, recurrent severe without psychotic features: Secondary | ICD-10-CM | POA: Diagnosis not present

## 2023-12-13 DIAGNOSIS — I10 Essential (primary) hypertension: Secondary | ICD-10-CM

## 2023-12-13 MED ORDER — AMPHETAMINE-DEXTROAMPHETAMINE 10 MG PO TABS: 10.0000 mg | ORAL_TABLET | Freq: Every day | ORAL | 0 refills | Status: AC

## 2023-12-13 MED ORDER — AMPHETAMINE-DEXTROAMPHET ER 30 MG PO CP24: 30.0000 mg | ORAL_CAPSULE | ORAL | 0 refills | Status: AC

## 2023-12-13 MED ORDER — HYDROCHLOROTHIAZIDE 25 MG PO TABS: 25.0000 mg | ORAL_TABLET | Freq: Every day | ORAL | 3 refills | Status: AC

## 2023-12-13 NOTE — Assessment & Plan Note (Signed)
 Attention-deficit hyperactivity disorder, combined type Adderall effective but some forgetfulness and distraction noted. No palpitations or chest pain. Medication wears off in time for rest. - Refilled extended release and short acting Adderall for 90 days.

## 2023-12-13 NOTE — Assessment & Plan Note (Addendum)
 Followed by psychiatry.  Now on Vraylar.  Managed with Vraylar, causing increased appetite. Metformin  used to manage weight gain. She is tracking weight and managing appetite. - Continue Vraylar. - Continue metformin  500 mg twice daily.

## 2023-12-13 NOTE — Assessment & Plan Note (Signed)
 Hypertension with peripheral edema Blood pressure elevated, peripheral edema worsens in evenings. Current hydrochlorothiazide  dose may be insufficient. - Increased hydrochlorothiazide  to 25 mg. - Rechecked blood pressure before she left today.

## 2023-12-13 NOTE — Progress Notes (Signed)
 Established Patient Office Visit  Patient ID: Andrea Marquez, female    DOB: 04-06-1970  Age: 53 y.o. MRN: [id number] PCP: Alvan Dorothyann BIRCH, MD  Chief Complaint  Patient presents with   mood   ADHD    Subjective:     HPI  Discussed the use of AI scribe software for clinical note transcription with the patient, who gave verbal consent to proceed.  History of Present Illness Andrea Marquez is a 53 year old female with hypertension and psychiatric conditions who presents for medication management and follow-up.  Psychiatric medication management and weight changes - Currently under psychiatric care. - Mood has improved compared to previous visit. - Vraylar initiated, replacing Abilify due to significant weight gain with Abilify. - Abilify caused increased appetite and weight gain from 200 to 242 pounds. - Metformin  500 mg twice daily prescribed to address weight gain. - Appetite has stabilized with Vraylar.  Attention deficit symptoms and stimulant use - Adderall 30 mg in the morning and 10 mg later in the day taken daily. - Adderall improves ability to manage daily tasks, though occasional distractibility persists. - Medication effect wears off in time for sleep; bedtime is typically 8:30-9:00 PM.  Peripheral edema and plantar fasciitis - Occasional swelling in the feet, particularly in the evenings. - Compression socks used intermittently, primarily for plantar fasciitis during incline walking.  Hypertension and cardiovascular symptoms - No chest pain or palpitations reported.  Glycemic control - Hemoglobin A1c measured at 5.2 in July.     ROS    Objective:     BP 128/84   Pulse 91   Ht 5' 8 (1.727 m)   Wt 242 lb (109.8 kg)   SpO2 98%   BMI 36.80 kg/m    Physical Exam Vitals and nursing note reviewed.  Constitutional:      Appearance: Normal appearance.  HENT:     Head: Normocephalic and atraumatic.  Eyes:     Conjunctiva/sclera:  Conjunctivae normal.  Cardiovascular:     Rate and Rhythm: Normal rate and regular rhythm.  Pulmonary:     Effort: Pulmonary effort is normal.     Breath sounds: Normal breath sounds.  Skin:    General: Skin is warm and dry.  Neurological:     Mental Status: She is alert.  Psychiatric:        Mood and Affect: Mood normal.      No results found for any visits on 12/13/23.    The 10-year ASCVD risk score (Arnett DK, et al., 2019) is: 2.2%    Assessment & Plan:   Problem List Items Addressed This Visit       Cardiovascular and Mediastinum   Hypertension   Hypertension with peripheral edema Blood pressure elevated, peripheral edema worsens in evenings. Current hydrochlorothiazide  dose may be insufficient. - Increased hydrochlorothiazide  to 25 mg. - Rechecked blood pressure before she left today.      Relevant Medications   hydrochlorothiazide  (HYDRODIURIL ) 25 MG tablet     Other   Severe episode of recurrent major depressive disorder, without psychotic features (HCC)   Followed by psychiatry.  Now on Vraylar.  Managed with Vraylar, causing increased appetite. Metformin  used to manage weight gain. She is tracking weight and managing appetite. - Continue Vraylar. - Continue metformin  500 mg twice daily.      Relevant Medications   hydrOXYzine  (ATARAX ) 25 MG tablet   Bilateral lower extremity edema   Some days  better than others in regards  to swelling in ankles and feet and hands.  We did discuss possibly increasing the hydrochlorothiazide  to 25 mg to see if that is helpful.  I think we have some wiggle room in regards to her blood pressure.      Attention deficit hyperactivity disorder (ADHD), combined type - Primary   Attention-deficit hyperactivity disorder, combined type Adderall effective but some forgetfulness and distraction noted. No palpitations or chest pain. Medication wears off in time for rest. - Refilled extended release and short acting Adderall for  90 days.      Relevant Medications   amphetamine -dextroamphetamine  (ADDERALL XR) 30 MG 24 hr capsule   amphetamine -dextroamphetamine  (ADDERALL XR) 30 MG 24 hr capsule (Start on 01/10/2024)   amphetamine -dextroamphetamine  (ADDERALL XR) 30 MG 24 hr capsule (Start on 02/07/2024)   amphetamine -dextroamphetamine  (ADDERALL) 10 MG tablet   amphetamine -dextroamphetamine  (ADDERALL) 10 MG tablet (Start on 01/10/2024)   amphetamine -dextroamphetamine  (ADDERALL) 10 MG tablet (Start on 02/07/2024)   Other Visit Diagnoses       Encounter for immunization       Relevant Orders   Flu vaccine trivalent PF, 6mos and older(Flulaval,Afluria,Fluarix,Fluzone) (Completed)   Tdap vaccine greater than or equal to 7yo IM (Completed)       Assessment and Plan Assessment & Plan Encounter for immunization Due for tetanus, pertussis booster, and flu shot due to exposure to grandson. - Administered TDAP and flu shot today.    No follow-ups on file.    Dorothyann Byars, MD [facility] at [facility]

## 2023-12-13 NOTE — Assessment & Plan Note (Addendum)
 Some days  better than others in regards to swelling in ankles and feet and hands.  We did discuss possibly increasing the hydrochlorothiazide  to 25 mg to see if that is helpful.  I think we have some wiggle room in regards to her blood pressure.
